# Patient Record
Sex: Male | Born: 1940 | Race: White | Hispanic: No | Marital: Married | Smoking: Never smoker
Health system: Southern US, Community
[De-identification: ages and names within clinical notes are randomized; demographics above are authoritative.]

## PROBLEM LIST (undated history)

## (undated) DIAGNOSIS — E039 Hypothyroidism, unspecified: Secondary | ICD-10-CM

## (undated) DIAGNOSIS — R0602 Shortness of breath: Secondary | ICD-10-CM

## (undated) DIAGNOSIS — I10 Essential (primary) hypertension: Secondary | ICD-10-CM

## (undated) DIAGNOSIS — G459 Transient cerebral ischemic attack, unspecified: Secondary | ICD-10-CM

## (undated) DIAGNOSIS — J449 Chronic obstructive pulmonary disease, unspecified: Secondary | ICD-10-CM

## (undated) DIAGNOSIS — E119 Type 2 diabetes mellitus without complications: Secondary | ICD-10-CM

## (undated) HISTORY — PX: OTHER SURGICAL HISTORY: SHX169

---

## 1999-12-09 ENCOUNTER — Encounter: Payer: Self-pay | Admitting: Emergency Medicine

## 1999-12-09 ENCOUNTER — Encounter (INDEPENDENT_AMBULATORY_CARE_PROVIDER_SITE_OTHER): Payer: Self-pay | Admitting: Specialist

## 1999-12-09 ENCOUNTER — Inpatient Hospital Stay (HOSPITAL_COMMUNITY): Admission: EM | Admit: 1999-12-09 | Discharge: 1999-12-12 | Payer: Self-pay | Admitting: Emergency Medicine

## 1999-12-11 ENCOUNTER — Encounter: Payer: Self-pay | Admitting: Cardiology

## 2000-02-21 ENCOUNTER — Ambulatory Visit (HOSPITAL_COMMUNITY): Admission: RE | Admit: 2000-02-21 | Discharge: 2000-02-21 | Payer: Self-pay | Admitting: Endocrinology

## 2002-10-17 ENCOUNTER — Ambulatory Visit (HOSPITAL_COMMUNITY): Admission: RE | Admit: 2002-10-17 | Discharge: 2002-10-17 | Payer: Self-pay | Admitting: *Deleted

## 2002-10-17 ENCOUNTER — Encounter (INDEPENDENT_AMBULATORY_CARE_PROVIDER_SITE_OTHER): Payer: Self-pay | Admitting: Specialist

## 2003-01-21 ENCOUNTER — Encounter: Admission: RE | Admit: 2003-01-21 | Discharge: 2003-01-21 | Payer: Self-pay | Admitting: Endocrinology

## 2003-01-21 ENCOUNTER — Encounter: Payer: Self-pay | Admitting: Endocrinology

## 2003-06-05 ENCOUNTER — Inpatient Hospital Stay (HOSPITAL_COMMUNITY): Admission: RE | Admit: 2003-06-05 | Discharge: 2003-06-06 | Payer: Self-pay | Admitting: Neurosurgery

## 2005-09-11 ENCOUNTER — Encounter (INDEPENDENT_AMBULATORY_CARE_PROVIDER_SITE_OTHER): Payer: Self-pay | Admitting: *Deleted

## 2005-09-11 ENCOUNTER — Ambulatory Visit (HOSPITAL_COMMUNITY): Admission: RE | Admit: 2005-09-11 | Discharge: 2005-09-13 | Payer: Self-pay | Admitting: Urology

## 2005-10-15 ENCOUNTER — Observation Stay (HOSPITAL_COMMUNITY): Admission: EM | Admit: 2005-10-15 | Discharge: 2005-10-16 | Payer: Self-pay | Admitting: Emergency Medicine

## 2005-12-26 ENCOUNTER — Encounter: Admission: RE | Admit: 2005-12-26 | Discharge: 2005-12-26 | Payer: Self-pay | Admitting: Endocrinology

## 2006-01-26 ENCOUNTER — Ambulatory Visit (HOSPITAL_BASED_OUTPATIENT_CLINIC_OR_DEPARTMENT_OTHER): Admission: RE | Admit: 2006-01-26 | Discharge: 2006-01-26 | Payer: Self-pay | Admitting: Urology

## 2006-01-26 ENCOUNTER — Encounter (INDEPENDENT_AMBULATORY_CARE_PROVIDER_SITE_OTHER): Payer: Self-pay | Admitting: Specialist

## 2007-11-04 ENCOUNTER — Ambulatory Visit (HOSPITAL_COMMUNITY): Admission: RE | Admit: 2007-11-04 | Discharge: 2007-11-04 | Payer: Self-pay | Admitting: *Deleted

## 2007-11-04 ENCOUNTER — Encounter (INDEPENDENT_AMBULATORY_CARE_PROVIDER_SITE_OTHER): Payer: Self-pay | Admitting: *Deleted

## 2009-09-03 ENCOUNTER — Encounter (INDEPENDENT_AMBULATORY_CARE_PROVIDER_SITE_OTHER): Payer: Self-pay | Admitting: Internal Medicine

## 2009-09-03 ENCOUNTER — Ambulatory Visit: Payer: Self-pay | Admitting: Internal Medicine

## 2009-09-03 ENCOUNTER — Inpatient Hospital Stay (HOSPITAL_COMMUNITY): Admission: EM | Admit: 2009-09-03 | Discharge: 2009-09-03 | Payer: Self-pay | Admitting: Emergency Medicine

## 2009-09-04 ENCOUNTER — Telehealth (INDEPENDENT_AMBULATORY_CARE_PROVIDER_SITE_OTHER): Payer: Self-pay | Admitting: *Deleted

## 2009-09-05 ENCOUNTER — Ambulatory Visit: Payer: Self-pay

## 2009-09-05 ENCOUNTER — Ambulatory Visit: Payer: Self-pay | Admitting: Cardiovascular Disease

## 2009-09-05 ENCOUNTER — Encounter (HOSPITAL_COMMUNITY): Admission: RE | Admit: 2009-09-05 | Discharge: 2009-11-06 | Payer: Self-pay | Admitting: Internal Medicine

## 2009-09-06 DIAGNOSIS — I10 Essential (primary) hypertension: Secondary | ICD-10-CM | POA: Insufficient documentation

## 2009-09-06 DIAGNOSIS — C679 Malignant neoplasm of bladder, unspecified: Secondary | ICD-10-CM | POA: Insufficient documentation

## 2009-09-06 DIAGNOSIS — E119 Type 2 diabetes mellitus without complications: Secondary | ICD-10-CM

## 2009-09-06 DIAGNOSIS — E785 Hyperlipidemia, unspecified: Secondary | ICD-10-CM

## 2009-09-07 ENCOUNTER — Ambulatory Visit: Payer: Self-pay | Admitting: Pulmonary Disease

## 2009-09-07 DIAGNOSIS — R0989 Other specified symptoms and signs involving the circulatory and respiratory systems: Secondary | ICD-10-CM

## 2009-09-07 DIAGNOSIS — R0609 Other forms of dyspnea: Secondary | ICD-10-CM

## 2009-10-01 ENCOUNTER — Ambulatory Visit: Payer: Self-pay | Admitting: Pulmonary Disease

## 2009-10-01 DIAGNOSIS — J438 Other emphysema: Secondary | ICD-10-CM

## 2010-06-16 ENCOUNTER — Emergency Department (HOSPITAL_COMMUNITY)
Admission: EM | Admit: 2010-06-16 | Discharge: 2010-06-16 | Payer: Self-pay | Source: Home / Self Care | Admitting: Emergency Medicine

## 2010-08-08 NOTE — Miscellaneous (Signed)
Summary: Orders Update pft charges  Clinical Lists Changes  Orders: Added new Service order of Carbon Monoxide diffusing w/capacity (94720) - Signed Added new Service order of Lung Volumes (94240) - Signed Added new Service order of Spirometry (Pre & Post) (94060) - Signed 

## 2010-08-08 NOTE — Assessment & Plan Note (Signed)
Summary: Cardiology Nuclear Study  Nuclear Med Background Indications for Stress Test: Evaluation for Ischemia  Indications Comments: 09/02/09 CP (-) enzymes  History: Echo, Heart Catheterization   Symptoms: Chest Pain, Chest Pain with Exertion, SOB    Nuclear Pre-Procedure Cardiac Risk Factors: Family History - CAD, History of Smoking, Lipids, NIDDM, Smoker Caffeine/Decaff Intake: None NPO After: 7:00 PM Lungs: diminished but without wheezing IV 0.9% NS with Angio Cath: 22g     IV Site: (L) AC IV Started by: Irven Baltimore RN Chest Size (in) 46     Height (in): 71 Weight (lb): 244 BMI: 34.15  Nuclear Med Study 1 or 2 day study:  1 day     Stress Test Type:  Carlton Adam Reading MD:  Jenkins Rouge, MD     Referring MD:  D.Bensimhon Resting Radionuclide:  Technetium 69m Tetrofosmin     Resting Radionuclide Dose:  11.0 mCi  Stress Radionuclide:  Technetium 13m Tetrofosmin     Stress Radionuclide Dose:  32.0 mCi   Stress Protocol   Lexiscan: 0.4 mg   Stress Test Technologist:  Perrin Maltese EMT-P     Nuclear Technologist:  Mariann Laster Deal RT-N  Rest Procedure  Myocardial perfusion imaging was performed at rest 45 minutes following the intravenous administration of Myoview Technetium 18m Tetrofosmin.  Stress Procedure  The patient received IV Lexiscan 0.4 mg over 15-seconds.  Myoview injected at 30-seconds.  There were no significant changes and a rare pvc with infusion.  Quantitative spect images were obtained after a 45 minute delay.  QPS Raw Data Images:  Normal; no motion artifact; normal heart/lung ratio. Stress Images:  NI: Uniform and normal uptake of tracer in all myocardial segments. Rest Images:  Normal homogeneous uptake in all areas of the myocardium. Subtraction (SDS):  Normal Transient Ischemic Dilatation:  no  (Normal <1.22)  Lung/Heart Ratio:  .38  (Normal <0.45)  Quantitative Gated Spect Images QGS EDV:  96 ml QGS ESV:  40 ml QGS EF:  59 % QGS cine images:   normal  Findings Normal nuclear study      Overall Impression  Exercise Capacity: Lexiscan BP Response: Normal blood pressure response. Clinical Symptoms: Dyspnea ECG Impression: No significant ST segment change suggestive of ischemia. Overall Impression: Normal stress nuclear study. Overall Impression Comments: Normal  Appended Document: Cardiology Nuclear Study ok  Appended Document: Cardiology Nuclear Study Left message to call back   Appended Document: Cardiology Nuclear Study pt aware

## 2010-08-08 NOTE — Assessment & Plan Note (Signed)
Summary: consult for dyspnea   Copy to:  Glori Bickers Primary Provider/Referring Provider:  Wilson Singer  CC:  Pulmonary Consult.  History of Present Illness: The pt is a 70y/o male who I have been asked to see for dyspnea.  He is unable to pinpoint when his sob started, but thinks it has been less than 5 years.  He describes doe at less than one block at a moderate pace on flat ground, but will not get winded bringing groceries in from the car.  He will get sob walking one flight of stairs.  He has a mild cough that is intermittantly productive of white mucus.  He has a long h/o tobacco abuse, but has never had pfts.  A recent cxr is clear.  He has had a recent echo that was normal.  His believes that his weight is neutral over the last 2 years.  Current Medications (verified): 1)  Glipizide Xl 10 Mg Xr24h-Tab (Glipizide) .... Take 1 Tablet By Mouth Once A Day 2)  Lisinopril 5 Mg Tabs (Lisinopril) .... Take 1 Tablet By Mouth Once A Day 3)  Maxzide-25 37.5-25 Mg Tabs (Triamterene-Hctz) .... Take 1 Tablet By Mouth Once A Day 4)  Metformin Hcl 500 Mg Tabs (Metformin Hcl) .... Take 1 Tablet By Mouth Two Times A Day 5)  Actos 30 Mg Tabs (Pioglitazone Hcl) .... Take 1 Tablet By Mouth Once A Day 6)  Zocor 20 Mg Tabs (Simvastatin) .... Take 1 Tablet By Mouth Once A Day 7)  Omeprazole 20 Mg Cpdr (Omeprazole) .... Take 1 Tablet By Mouth Once A Day 8)  Synthroid 150 Mcg Tabs (Levothyroxine Sodium) .... Take 1 Tablet By Mouth Once A Day  Allergies (verified): 1)  ! Prednisone  Past History:  Past Medical History:  CARCINOMA, BLADDER (ICD-188.9) HYPERTENSION (ICD-401.9) HYPERLIPIDEMIA (ICD-272.4) DIABETES, TYPE 2 (ICD-250.00)    Past Surgical History: cholecystectomy neck surgery back surgery tonsillectomy  Family History: Reviewed history and no changes required. cancer: father (prostate), mother (colon), sister (breast)   Social History: Reviewed history from 09/06/2009 and no changes  required. Patient states former smoker. started at age 77.  quit 2006. 2 to 2 1/2 ppd. pt is married and lives with wife. pt is a retired Administrator.   Review of Systems       The patient complains of shortness of breath with activity, non-productive cough, and chest pain.  The patient denies shortness of breath at rest, productive cough, coughing up blood, irregular heartbeats, acid heartburn, indigestion, loss of appetite, weight change, abdominal pain, difficulty swallowing, sore throat, tooth/dental problems, headaches, nasal congestion/difficulty breathing through nose, sneezing, itching, ear ache, anxiety, depression, hand/feet swelling, joint stiffness or pain, rash, change in color of mucus, and fever.    Vital Signs:  Patient profile:   70 year old male Height:      70 inches Weight:      249 pounds BMI:     35.86 O2 Sat:      96 % on Room air Temp:     97.6 degrees F oral Pulse rate:   102 / minute BP sitting:   116 / 64  (left arm) Cuff size:   large  Vitals Entered By: Matthew Folks LPN (March  4, 624THL 579FGE PM)  O2 Flow:  Room air CC: Pulmonary Consult Comments Medications reviewed with patient Matthew Folks LPN  March  4, 624THL 579FGE PM    Physical Exam  General:  ow male in nad Eyes:  PERRLA  and EOMI.   Nose:  patent without discharge Mouth:  mild elonagation of soft palate and uvula Neck:  no jvd, tmg, LN Lungs:  decreased bs throughout, no wheezing or rhonchi  Heart:  rrr, no mrg Abdomen:  soft and nontender, bs+ Extremities:  no edema noted, pulses intact distally no cyanosis Neurologic:  alert and oriented, moves all 4.   Impression & Recommendations:  Problem # 1:  DYSPNEA ON EXERTION (ICD-786.09) The pt has doe with primarily moderate exertional levels, but is interfering with his QOL.  He is overweight and deconditioned, but he may have underlying obstructive lung disease given his extensive tobacco abuse history.  I would like to schedule for  full pfts, and have him f/u with me thereafter.  Medications Added to Medication List This Visit: 1)  Actos 30 Mg Tabs (Pioglitazone hcl) .... Take 1 tablet by mouth once a day 2)  Omeprazole 20 Mg Cpdr (Omeprazole) .... Take 1 tablet by mouth once a day 3)  Synthroid 150 Mcg Tabs (Levothyroxine sodium) .... Take 1 tablet by mouth once a day  Other Orders: Consultation Level IV [id number]) Pulmonary Referral (Pulmonary)  Patient Instructions: 1)  will set up for breathing studies, and see you back the same day. 2)  work on weight loss and conditiioning.

## 2010-08-08 NOTE — Assessment & Plan Note (Signed)
Summary: rov for emphysema   Visit Type:  Follow-up Copy to:  Glori Bickers Primary Provider/Referring Provider:  Wilson Singer  CC:  Pt here for follow up with PFT.  History of Present Illness: the pt comes in today for f/u of his pfts, as part of a w/u for dyspnea.  He was found to have severe airflow obstruction, but had a 38% and 49% improvement in FEV1 and FVC respectively.  He had no restriction, and had significant airtrapping with increased RV.  Dlco was mildly reduced.  I have reviewed the study with him in detail, and answered all of his questions.  Current Medications (verified): 1)  Glipizide Xl 10 Mg Xr24h-Tab (Glipizide) .... Take 1 Tablet By Mouth Once A Day 2)  Lisinopril 5 Mg Tabs (Lisinopril) .... Take 1 Tablet By Mouth Once A Day 3)  Maxzide-25 37.5-25 Mg Tabs (Triamterene-Hctz) .... Take 1 Tablet By Mouth Once A Day 4)  Metformin Hcl 500 Mg Tabs (Metformin Hcl) .... Take 1 Tablet By Mouth Two Times A Day 5)  Actos 30 Mg Tabs (Pioglitazone Hcl) .... Take 1 Tablet By Mouth Once A Day 6)  Zocor 20 Mg Tabs (Simvastatin) .... Take 1 Tablet By Mouth Once A Day 7)  Omeprazole 20 Mg Cpdr (Omeprazole) .... Take 1 Tablet By Mouth Once A Day 8)  Synthroid 150 Mcg Tabs (Levothyroxine Sodium) .... Take 1 Tablet By Mouth Once A Day  Allergies (verified): 1)  ! Prednisone  Vital Signs:  Patient profile:   70 year old male Height:      70 inches Weight:      244 pounds O2 Sat:      91 % on Room air Temp:     98.0 degrees F oral Pulse rate:   101 / minute BP sitting:   110 / 64  (left arm) Cuff size:   large  Vitals Entered By: Iran Planas CMA (October 01, 2009 2:19 PM)  O2 Flow:  Room air CC: Pt here for follow up with PFT Comments Medications reviewed with patient Verified contact number and pharmacy with patient Iran Planas CMA  October 01, 2009 2:22 PM    Physical Exam  General:  ow male in nad   Impression & Recommendations:  Problem # 1:  EMPHYSEMA  (ICD-492.8)  the pt has severe airflow obstruction by his pfts, but has a very significant response to bronchodilators.  HIs history and findings are most c/w at lease moderate emphysema with an asthmatic component.  I have had a long discussion with him about his diagnosis, and have recommended an aggressive bronchodilator regimen for 6-8 weeks to see how he responds.  Time spent with pt today reviewing the above was 78min.  Medications Added to Medication List This Visit: 1)  Spiriva Handihaler 18 Mcg Caps (Tiotropium bromide monohydrate) .... One puff in handihaler daily 2)  Symbicort 160-4.5 Mcg/act Aero (Budesonide-formoterol fumarate) .... Two puffs twice daily 3)  Proair Hfa 108 (90 Base) Mcg/act Aers (Albuterol sulfate) .... 2 puffs every 4-6 hours as needed  Other Orders: Est. Patient Level III [id number])  Patient Instructions: 1)  start on spiriva one inhalation each am 2)  start symbicort 160/4.5  2 inhalations am and pm....rinse mouth, gargle, and spit after each use. 3)  carry albuterol inhaler with you in your pocket to use for emergencies...2 inhalations at a time, no more than 4 times a day. 4)  work on weight loss and some type of exercise program 5)  followup  with me in 8 weeks.  Prescriptions: PROAIR HFA 108 (90 BASE) MCG/ACT  AERS (ALBUTEROL SULFATE) 2 puffs every 4-6 hours as needed  #1 x 6   Entered and Authorized by:   Kathee Delton MD   Signed by:   Kathee Delton MD on 10/01/2009   Method used:   Print then Give to Patient   RxID:   [id number] SYMBICORT 160-4.5 MCG/ACT  AERO (BUDESONIDE-FORMOTEROL FUMARATE) Two puffs twice daily  #1 x 6   Entered and Authorized by:   Kathee Delton MD   Signed by:   Kathee Delton MD on 10/01/2009   Method used:   Print then Give to Patient   RxID:   [id number] SPIRIVA HANDIHALER 18 MCG  CAPS (TIOTROPIUM BROMIDE MONOHYDRATE) one puff in handihaler daily  #30 x 6   Entered and Authorized by:   Kathee Delton MD    Signed by:   Kathee Delton MD on 10/01/2009   Method used:   Print then Give to Patient   RxID:   [id number]    Immunization History:  Influenza Immunization History:    Influenza:  historical (09/17/2009)  Pneumovax Immunization History:    Pneumovax:  historical (07/09/2009)

## 2010-08-08 NOTE — Progress Notes (Signed)
Summary: Nuclear Pre-Procedure  Phone Note Outgoing Call   Call placed by: Perrin Maltese, EMT-P,  September 04, 2009 3:20 PM Summary of Call: Left message with information on Myoview Information Sheet (see scanned document for details).      Nuclear Med Background Indications for Stress Test: Evaluation for Ischemia  Indications Comments: 09/02/09 CP (-) enzymes  History: Echo, Heart Catheterization   Symptoms: Chest Pain, Chest Pain with Exertion, SOB    Nuclear Pre-Procedure Cardiac Risk Factors: Family History - CAD, History of Smoking, Lipids, NIDDM, Smoker  Nuclear Med Study Referring MD:  D.Bensimhon

## 2010-09-16 LAB — BASIC METABOLIC PANEL
BUN: 10 mg/dL (ref 6–23)
Chloride: 104 mEq/L (ref 96–112)
Creatinine, Ser: 1.16 mg/dL (ref 0.4–1.5)
GFR calc Af Amer: >60 mL/min (ref >60)
Glucose, Bld: 66 mg/dL — ABNORMAL LOW (ref 70–99)
Potassium: 3.6 mEq/L (ref 3.5–5.1)

## 2010-09-16 LAB — CBC
HCT: 31.4 % — ABNORMAL LOW (ref 39.0–52.0)
MCHC: 31.2 g/dL (ref 30.0–36.0)
MCV: 86 fL (ref 78.0–100.0)
RDW: 12.9 % (ref 11.5–15.5)

## 2010-09-16 LAB — POCT I-STAT, CHEM 8
BUN: 11 mg/dL (ref 6–23)
Calcium, Ion: 1.04 mmol/L — ABNORMAL LOW (ref 1.12–1.32)
Glucose, Bld: 66 mg/dL — ABNORMAL LOW (ref 70–99)
HCT: 32 % — ABNORMAL LOW (ref 39.0–52.0)
TCO2: 27 mmol/L (ref 0–100)

## 2010-09-16 LAB — DIFFERENTIAL
Basophils Absolute: 0 10*3/uL (ref 0.0–0.1)
Basophils Relative: 0 % (ref 0–1)
Eosinophils Relative: 1 % (ref 0–5)
Monocytes Absolute: 0.7 10*3/uL (ref 0.1–1.0)

## 2010-09-25 LAB — DIFFERENTIAL
Lymphocytes Relative: 14 % (ref 12–46)
Lymphs Abs: 0.9 10*3/uL (ref 0.7–4.0)
Monocytes Relative: 8 % (ref 3–12)
Neutro Abs: 4.9 10*3/uL (ref 1.7–7.7)
Neutrophils Relative %: 74 % (ref 43–77)

## 2010-09-25 LAB — URINALYSIS, ROUTINE W REFLEX MICROSCOPIC
Glucose, UA: NEGATIVE mg/dL
Ketones, ur: NEGATIVE mg/dL
Protein, ur: NEGATIVE mg/dL

## 2010-09-25 LAB — IRON AND TIBC
Iron: 41 ug/dL — ABNORMAL LOW (ref 42–135)
Saturation Ratios: 12 % — ABNORMAL LOW (ref 20–55)
UIBC: 291 ug/dL

## 2010-09-25 LAB — CROSSMATCH
ABO/RH(D): O POS
Antibody Screen: NEGATIVE

## 2010-09-25 LAB — COMPREHENSIVE METABOLIC PANEL
ALT: 15 U/L (ref 0–53)
AST: 18 U/L (ref 0–37)
Albumin: 3.6 g/dL (ref 3.5–5.2)
BUN: 19 mg/dL (ref 6–23)
CO2: 25 mEq/L (ref 19–32)
Calcium: 8.9 mg/dL (ref 8.4–10.5)
Calcium: 9.1 mg/dL (ref 8.4–10.5)
Chloride: 104 mEq/L (ref 96–112)
Creatinine, Ser: 1.41 mg/dL (ref 0.4–1.5)
Creatinine, Ser: 1.69 mg/dL — ABNORMAL HIGH (ref 0.4–1.5)
GFR calc Af Amer: >60 mL/min (ref >60)
GFR calc non Af Amer: 41 mL/min — ABNORMAL LOW (ref >60)
Glucose, Bld: 102 mg/dL — ABNORMAL HIGH (ref 70–99)
Sodium: 136 mEq/L (ref 135–145)
Total Protein: 6.9 g/dL (ref 6.0–8.3)

## 2010-09-25 LAB — CARDIAC PANEL(CRET KIN+CKTOT+MB+TROPI)
CK, MB: 2.2 ng/mL (ref 0.3–4.0)
CK, MB: 2.3 ng/mL (ref 0.3–4.0)
Relative Index: 1.9 (ref 0.0–2.5)
Relative Index: 2.1 (ref 0.0–2.5)
Total CK: 107 U/L (ref 7–232)
Total CK: 118 U/L (ref 7–232)
Troponin I: 0.01 ng/mL (ref 0.00–0.06)

## 2010-09-25 LAB — LIPID PANEL: VLDL: 22 mg/dL (ref 0–40)

## 2010-09-25 LAB — CBC
Hemoglobin: 10.3 g/dL — ABNORMAL LOW (ref 13.0–17.0)
MCHC: 33.9 g/dL (ref 30.0–36.0)
MCHC: 34.1 g/dL (ref 30.0–36.0)
MCV: 92.3 fL (ref 78.0–100.0)
MCV: 92.5 fL (ref 78.0–100.0)
Platelets: 157 10*3/uL (ref 150–400)
RBC: 3.02 MIL/uL — ABNORMAL LOW (ref 4.22–5.81)
RDW: 13.6 % (ref 11.5–15.5)
WBC: 7 10*3/uL (ref 4.0–10.5)

## 2010-09-25 LAB — POCT CARDIAC MARKERS
CKMB, poc: 1.5 ng/mL (ref 1.0–8.0)
Myoglobin, poc: 123 ng/mL (ref 12–200)
Myoglobin, poc: 93.2 ng/mL (ref 12–200)
Troponin i, poc: <0.05 ng/mL (ref 0.00–0.09)

## 2010-09-25 LAB — TROPONIN I: Troponin I: 0.02 ng/mL (ref 0.00–0.06)

## 2010-09-25 LAB — RETICULOCYTES
RBC.: 3.1 MIL/uL — ABNORMAL LOW (ref 4.22–5.81)
Retic Count, Absolute: 34.1 10*3/uL (ref 19.0–186.0)

## 2010-09-25 LAB — VITAMIN B12: Vitamin B-12: 213 pg/mL (ref 211–911)

## 2010-09-25 LAB — ABO/RH: ABO/RH(D): O POS

## 2010-09-25 LAB — HEMOGLOBIN A1C
Hgb A1c MFr Bld: 7.2 % — ABNORMAL HIGH (ref 4.6–6.1)
Mean Plasma Glucose: 160 mg/dL

## 2010-09-25 LAB — CK TOTAL AND CKMB (NOT AT ARMC): CK, MB: 2.1 ng/mL (ref 0.3–4.0)

## 2010-09-25 LAB — GLUCOSE, CAPILLARY: Glucose-Capillary: 104 mg/dL — ABNORMAL HIGH (ref 70–99)

## 2010-09-25 LAB — D-DIMER, QUANTITATIVE: D-Dimer, Quant: 0.29 ug/mL-FEU (ref 0.00–0.48)

## 2010-11-19 NOTE — Op Note (Signed)
NAME:  Harold Martin, Harold Martin NO.:  [id number]   MEDICAL RECORD NO.:  [id number]          PATIENT TYPE:  AMB   LOCATION:  ENDO                         FACILITY:  [facility]   PHYSICIAN:  Waverly Ferrari, M.D.    DATE OF BIRTH:  1940/10/01   DATE OF PROCEDURE:  11/04/2007  DATE OF DISCHARGE:                               OPERATIVE REPORT   PROCEDURE:  Colonoscopy.   INDICATIONS:  Colon polyps.   ANESTHESIA:  Fentanyl 50 mcg, Versed 2 mg.   DESCRIPTION OF PROCEDURE:  With the patient mildly sedated in the left  lateral decubitus position, a rectal exam was attempted.  Subsequently  the Pentax videoscopic colonoscope was inserted in the rectum and passed  under direct vision with pressure applied to reach the cecum identified  by the ileocecal valve and appendiceal orifice both of which were  photographed.  From this point the colonoscope was slowly withdrawn  taking circumferential views of colonic mucosa stopping to photograph  diverticula along the way in the sigmoid colon until we reached the  rectum which appeared normal on direct and showed hemorrhoids and a  polyp on retroflex view.  The polyp was removed using hot biopsy forceps  technique setting of 20/150 blended current.  The endoscope was  straightened and withdrawn.  The patient's vital signs and pulse  oximeter remained stable.  The patient tolerated the procedure well  without apparent complications.   FINDINGS:  1. Mild diverticulosis of the sigmoid colon.  2. Internal hemorrhoids.  3. Polyp of rectum.   Await biopsy report.  The patient will call me for results and follow-up  with me as an outpatient.           ______________________________  Waverly Ferrari, M.D.     GMO/MEDQ  D:  11/04/2007  T:  11/04/2007  Job:  [id number]

## 2010-11-19 NOTE — Op Note (Signed)
NAME:  Harold Martin, Harold Martin NO.:  [id number]   MEDICAL RECORD NO.:  [id number]          PATIENT TYPE:  AMB   LOCATION:  ENDO                         FACILITY:  [facility]   PHYSICIAN:  Waverly Ferrari, M.D.    DATE OF BIRTH:  02/11/1941   DATE OF PROCEDURE:  11/04/2007  DATE OF DISCHARGE:                               OPERATIVE REPORT   PROCEDURE:  Upper endoscopy.   INDICATIONS:  Gastroesophageal reflux disease.   ANESTHESIA:  Fentanyl 50 mcg, Versed 8 mg.   PROCEDURE:  With the patient mildly sedated in the left lateral  decubitus position, the Pentax videoscopic endoscope was inserted in the  mouth and passed under direct vision through the esophagus which  appeared normal, until we reached distal esophagus and there appeared to  be a possibly early stricture with possible Barrett's seen as well.  This was photographed and biopsied.  We entered into the stomach.  Fundus, body, antrum, duodenal bulb, second portion of duodenum were  visualized.  From this point the endoscope was slowly withdrawn taking  circumferential views of the duodenal mucosa until the endoscope had  been pulled back in the stomach, placed in retroflexion to view the  stomach from below.  The endoscope was then straightened and withdrawn  taking circumferential views of the remaining gastric and esophageal  mucosa.  The patient's vital signs and pulse oximeter remained stable.  The patient tolerated the procedure well without apparent complications.   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   Await biopsy report.  The patient will call me for results and follow up  with me as an outpatient.  Proceed to colonoscopy           ______________________________  Waverly Ferrari, M.D.     GMO/MEDQ  D:  11/04/2007  T:  11/04/2007  Job:  [id number]

## 2010-11-22 NOTE — Procedures (Signed)
[place]. [facility]  Patient:    Harold Martin, Harold Martin                     MRN: [id number] Adm. Date:  [id number] Disc. Date: [id number] Attending:  Lawana Pai CC:         Jeanella Craze. Little, M.D.                           Procedure Report  PROCEDURE:  Upper endoscopy with biopsy.  INDICATIONS:  Chest and abdominal pain, see my consult note and hospitalization records for further details.  The patient was admitted to rule out coronary disease and pulmonary disease.  That done, history certainly was compatible with reflux and esophageal spasm.  Endoscopy done to assess a baseline objectively.  ANESTHESIA:  Demerol 70 mg, Versed 5 mg were given intravenously in divided dose.  DESCRIPTION OF PROCEDURE:  With the patient mildly sedated in the left lateral decubitus position, the Olympus videoscopic endoscope was inserted in the mouth and passed under direct vision through the esophagus.  Distal esophagus was approached and there appeared to be an area of Barretts esophagus with a tongue of erythematous tissue extending cephalad into the esophagus.  We were able to photograph this and subsequently biopsy this area along with some other areas of irregularity of the Z-line separating the esophagus and stomach that were consistent with esophagitis.  Once accomplished, the endoscope was advanced in the stomach, fundus, body, antrum all approached and photographs were taken and appeared normal. The endoscope was then advanced into the duodenal bulb and there was mild erythema consistent with mild duodenitis photographed only.  The second portion of the duodenum was seen and it appeared normal, it was photographed.  From this point, the endoscope was slowly withdrawn taking circumferential views and the entire duodenum and mucosa visualized until the endoscope had been pulled back into the stomach. It was placed in retroflexion and view of the stomach below and a  hiatal hernia was seen as evidence by incomplete wrap of the JE junction around the endoscope, this too was photographed.  The endoscope was then straightened and pulled back from distal to proximal stomach taking circumferential views of the entire gastric and subsequently esophageal mucosa which otherwise appeared normal.  The patients vital signs and pulse oximeter remained stable.  The patient tolerated the procedure well without apparent complications.  FINDINGS:  Mild changes of reflux with possible Barretts esophagus seen and photographed.  Await biopsy report.  The patient will call me for results and will follow up with me as an outpatient.  Will continue Prevacid b.i.d. as prescribed until I see him as an outpatient and will assess further. DD:  12/12/99 TD:  12/16/99 Job: [id number]

## 2010-11-22 NOTE — Cardiovascular Report (Signed)
[place]. [facility]  Patient:    Harold Martin, Harold Martin                     MRN: [id number] Proc. Date: 12/10/99 Adm. Date:  [id number] Disc. Date: [id number] Attending:  Lawana Pai CC:         Arleta Creek, M.D.             Jeanella Craze. Little, M.D.             [facility]                        Cardiac Catheterization  PROCEDURES: 1. Left heart catheterization. 2. Selective right and left coronary arteriography. 3. Ventriculography.  COMPLICATIONS:  None.  INDICATION:  [name] is a 70 year old male who has had chest pain intermittently, usually occurring at rest.  A nuclear study was suspicious for inferior ischemia.  He presented to the emergency room with worsening chest pain and has ruled out for myocardial infarction and is now brought to the cath lab to resolve the issue of CAD.  DESCRIPTION OF PROCEDURE:  Patient was prepped and draped in the usual sterile fashion, exposing the right groin.  Following local anesthetic with 1% Xylocaine, the Seldinger technique was employed and a 6-French introducer sheath was placed into the right femoral artery.  Selective right and left coronary arteriography and ventriculography in the RAO projection were performed; 6-French Judkins configuration catheters were used.  RESULTS: 1. Hemodynamic monitoring:  Central aortic pressure 142/84.  Left ventricular    pressure 141/18.  There was no aortic valve gradient noted at the time of    pullback. 2. Ventriculography:  Ventriculography in the RAO projection revealed normal    left ventricular systolic function, ejection fraction greater than 60% and    the end-diastolic pressure was 18. 3. Coronary arteriography:    a. Left main normal.    b. LAD:  The LAD had a mid-area of eccentric narrowing of about 50%, just       distal to the first diagonal.  The distal LAD and the diagonal were free       of disease.    c. Circumflex:  The circumflex  gave rise to two large OM vessels.  OM #1       had a proximal 40-50% area of narrowing; the distal vessel was free of       disease.  OM #2 was free of disease.    d. Right coronary artery:  The right coronary had proximal 40% narrowing,       with the remainder of vessel being free of significant disease.  CONCLUSION: 1. 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 2. Normal left ventricular systolic function.  At this point, there is no evidence of any severe obstruction to explain his rest pain.  Will check a spiral CT to rule out a pulmonary embolus or aortic dissection.  Of note, an aortic root injection showed no evidence of dilatation of the aortic arch. DD:  12/16/99 TD:  12/18/99 Job: [id number]

## 2010-11-22 NOTE — Op Note (Signed)
NAME:  Harold Martin, Harold Martin              ACCOUNT NO.:  [id number]   MEDICAL RECORD NO.:  [id number]          PATIENT TYPE:  AMB   LOCATION:  DAY                          FACILITY:  [facility]   PHYSICIAN:  Lillette Boxer. Dahlstedt, M.D.DATE OF BIRTH:  1940-07-11   DATE OF PROCEDURE:  09/11/2005  DATE OF DISCHARGE:                                 OPERATIVE REPORT   PREOPERATIVE DIAGNOSIS:  Hematuria with bladder tumor detected in office  cystoscopy.   POSTOPERATIVE DIAGNOSIS:  Hematuria with bladder tumor detected in office  cystoscopy.   PROCEDURE:  1.  Cystoscopy.  2.  TURBT. (Median 3 cm tumor).   ANESTHESIA:  General.   INDICATIONS:  This is a 70 year old gentleman who presented to Dr.  Alan Ripper clinic with hematuria, clots.  Office cystoscopy at the time  revealed 3 cm left wall bladder tumor.  The patient, at this point, warrants  formal cystoscopy in the operating room with TURBT.   DESCRIPTION OF PROCEDURE:  The patient was brought to the operating room  after preoperative antibiotics were given.  Time-out was taken to note  proper diagnosis, patient, and procedure.  The patient was placed in  dorsolithotomy position.  He was prepped and draped in normal sterile  fashion.  The resectoscope sheath was then slowly introduced, and the  bladder was then irrigated.  Multiple clots were noted, were irrigated out.  The resectoscope was then inserted, and the entire bladder was  pancystoscoped.  There was a 3 cm left lateral wall tumor that was just  lateral to the left ureteral orifice.  Both ureteral orifices were  identified and were effluxing urine normally.  The entire tumor was  resected.  Care was taken not to perforate the bladder.  The left ureteral  orifice was visualized frequently during the resection and avoided.  The  tumor was taken out.  Hemostasis was achieved.  Further inspection revealed  left ureteral orifice did not reveal any obvious injuries.  The resectoscope  was  taken out, and the regular cystoscope was then inserted.  This was done  after the tumor chips were irrigated out.  Upon insertion of the cystoscope,  the left ureteral orifice was cannulated with open-ended ureteral catheter.  The left catheter was introduced without difficulty into the left ureteral  orifice and advanced without resistance.  No urine drips were noted to be  coming out of the end of the catheter, indicating no obvious injury to the  left ureter or ureteral orifice.  The ureteral catheter and the cystoscope  were taken out.  Care was taken to inspect the urethral mucosa which was  devoid of masses, lesions, or stones.  Foley catheter was then inserted, and  10 mL of water were directed through the balloon.  The urinary drainage was  clear.   DISPOSITION:  The patient was extubated in the operating room and taken in  stable condition to PACU care.  Please note that Dr. Diona Fanti was present  and participated in the entire procedure as he was the responsible surgeon.     ______________________________  S. Para March  Lillette Boxer. Dahlstedt, M.D.  Electronically Signed    SK/MEDQ  D:  09/11/2005  T:  09/12/2005  Job:  [id number]

## 2010-11-22 NOTE — Discharge Summary (Signed)
[place]. [facility]  Patient:    Harold Martin, Harold Martin                     MRN: [id number] Adm. Date:  [id number] Disc. Date: [id number] Attending:  Lawana Pai Dictator:   Baker Janus, P.A. CC:         Jim Desanctis, M.D.             Jeanella Craze. Little, M.D.             Arleta Creek, M.D.                           Discharge Summary  DATE OF BIRTH:   06-Nov-1940.  ADMITTING DIAGNOSES: 1. Chest pain. 2. Diabetes. 3. Hypothyroidism. 4. Obesity. 5. Prostatitis. 6. Status post cholecystectomy in 1968. 7. Reflux.  CONSULTS:  GI for noncardiac chest pain December 11, 1999.  PROCEDURES: 1. Left heart catheterization December 10, 1999. 2. Spiral CT December 11, 1999. 3. EGD, December 12, 1999.  COMPLICATIONS:  None.  DISCHARGE CONDITION:  Much improved.  No complaints of chest pain, shortness of breath, or arrhythmias. Ambulating the hallways without difficulty.  DISPOSITION:  Home.  DISCHARGE MEDICATIONS: 1. Synthroid 150 mcg 1 p.o. q. day. 2. Glucophage 500 mg 1 p.o. b.i.d. restart Saturday. 3. Glucotrol 10 mg 1 p.o. b.i.d. 4. Tylenol p.r.n. 5. Prevacid 30 mg 1 p.o. b.i.d. 6. Baycol 0.4 mg 1 p.o. q. day.  DISCHARGE INSTRUCTIONS:  No strenuous activity, sexual activity or heavy lifting greater than 10 pounds over the next 48 hours.  DIET:  Maintain low fat, low salt, low cholesterol diet.  Maintain diabetic diet.  WOUND CARE:  May shower today.  Observe right groin for severe bleeding, swelling or inflammation. Call with fever greater than 101.0.  No smoking!.  No drinking!.  FOLLOWUP: 1. See Dr. Rex Kras in 3-4 weeks.  Call office for appointment. 2. See Dr. Lajoyce Corners in 2-3 weeks.  Call on Monday for biopsy report. 3. See Dr. Wilson Singer to follow blood sugars.  HISTORY OF PRESENT ILLNESS:  The patient is a 70 year old white male with no known coronary artery disease with 3 months of chest pain in the left breast expressed as "tearing/sticking" with weakness,  diaphoresis, lightheadedness, and nausea.  Occasionally loses balance with walking during episode.  Each episode 10 minutes to an hour with tingling in the left hand and leg today. On average 2 to 3 times a day and 8 to 10 times a week with exertion or riding in truck.  He had a Persantine Cardiolite on Nov 22, 1999, read by Dr. Claiborne Billings, and indicated distal inferior and inferolateral ischemia with an ejection fraction of 52%.  He was instructed to set up an appointment with [facility] for evaluation but nurse did not return call for several days then told patient would need referral from primary care physician.  This a.m. wife noticed patient was flushed and pale as he was leaving to go to work. Wife called primary care and was instructed to go to the emergency room. Worse pain 10/10, currently 0/10 after given sublingual nitroglycerin.  The patient was admitted for anginal type symptoms and started on IV heparin, plavix and aspirin.  The patient was also stopped on his glucophage and currently pain free.  ALLERGIES:  No known drug allergies.  LABORATORY DATA:  Hematology on December 10, 1999, showed white blood count  6.6, hemoglobin 13.4, hematocrit 37.9, and platelets 172.  Coag studies on admission:  PT 13.2, INR 1, and PTT 76.  Routine chemistries on December 10, 1999, showed sodium 136, potassium 5.1, chloride 98, CO2 32, glucose 144, BUN 10, creatinine 1.0, calcium 9.2, total protein 6.4, albumin 3.5, AST 36, ALT 38, and ALP 77, total bilirubin 0.4, magnesium 1.7.  Cardiac markers ranged from total CK 194 to 124, CK-MB 1.6 to 2.5, relative index 1.8 to 1.1, and troponin I less 0.3.  Lipid profile done on December 10, 1999, showed total cholesterol 199, triglycerides 230, HDL 32, and LDL 121.  Urinalysis on December 10, 1999, was negative.  PA and lateral chest x-ray done on December 10, 1999, showed COPD/congestive heart failure, slight interval improvement.  Initial EKG on  admission normal sinus rhythm with incomplete right bundle branch block with no old tracing to compare. Subsequent EKG on the following day normal sinus rhythm with no significant changes since last tracing.  Left heart catheterization performed on December 10, 1999, showed the left main to be normal.  LAD mid eccentric 50% after first diagonal with distal LAD and diagonal okay.  Circumflex 2 large ______ with OM-1 proximal 40-50%.  Distal okay with OM-2 okay.  RCA with proximal 40%, with remainder okay.  LV normal systolic function with EF 60%.  No severe obstruction to explain his rest pain.  Will check spiral CT to rule out PE or aortic dissection.  Will discontinue heparin at this point.  Spiral CT negative for PE.  EGD on December 12, 1999, showed questionable short segment Barretts esophagitis, mild esophagitis, mild duodenitis.  Agree with b.i.d. prevacid with patient to follow up with biopsy report on Monday and followup in the office for an office visit in 2-4 weeks with Dr. Lajoyce Corners.  HOSPITAL COURSE:  The patient admitted on December 09, 1999, with anginal type symptoms and started on IV heparin, plavix and aspirin with glucophage stopped for possible intervention.  The patient was admitted to a telemetry bed with serial cardiac enzymes.  The patient had presented to the ER with several months of chest pain in his left breast associated with weakness, diaphoresis, lightheadedness, and nausea.  He had actually spoken with his primary care physician who had instructed him to come to the emergency room and had had a positive cardiolite done and read by Dr. Claiborne Billings at [facility] and had instructed him to schedule a followup appointment but was unable to do so.  The patient presented to [facility] ER and Dr. Aldona Bar was called in to evaluate.  The patient continued to do well overnight with no complaints of chest pain,  shortness of breath and arrhythmias but complained of  "swimmy headedness" believed to be due to nitroglycerin.  Blood pressure were stable and otherwise physical exam unremarkable.  The patient underwent cardiac catheterization without complications and was found to be noncardiac in origin.  GI evaluation to follow with EGD followup with some esophagitis found by Dr. Lajoyce Corners who would followup with patient as an outpatient.  A spiral CT was also performed to rule out pulmonary embolism.  This was also found to be negative.  The patient was discharged on December 12, 1999, with followup with Dr. Rex Kras in 2 weeks and followup with Dr. Lajoyce Corners in 3-4 weeks.  The patient was also instructed to call Dr. Shelly Flatten office on Monday for results of biopsy taken during EGD. DD:  01/07/00 TD:  01/07/00 Job: [id number]

## 2010-11-22 NOTE — Op Note (Signed)
   NAME:  Harold Martin, Harold Martin                        ACCOUNT NO.:  [id number]   MEDICAL RECORD NO.:  [id number]                   PATIENT TYPE:  AMB   LOCATION:  ENDO                                 FACILITY:  [facility]   PHYSICIAN:  Waverly Ferrari, M.D.                 DATE OF BIRTH:  [birth year]   DATE OF PROCEDURE:  10/17/2002  DATE OF DISCHARGE:                                 OPERATIVE REPORT   PROCEDURE PERFORMED:  Colonoscopy with polypectomy and biopsy.   ENDOSCOPIST:  Waverly Ferrari, M.D.   INDICATIONS FOR PROCEDURE:  Hemoccult positivity.   ANESTHESIA:  None further given.   DESCRIPTION OF PROCEDURE:  With the patient mildly sedated in the left  lateral decubitus position, the Olympus video colonoscope was inserted in  the rectum and passed under direct vision to the cecum, identified by the  ileocecal valve and appendiceal orifice, both of which were photographed.  From this point the colonoscope was slowly withdrawn taking circumferential  views of the entire colonic mucosa.  Of note, the prep was suboptimal and  there were areas of solid stool and areas of brownish thick liquid that  coated the colonic wall.  We washed and suctioned as best we could until we  reached approximately 20 cm from the anal verge at which point, polyp was  seen, photographed and removed using snare cautery technique setting of  20/20 blended current.  There was some residual tissue removed using hot  biopsy forceps technique, again a setting of 20/20 blended current.  There  was good hemostasis.  The endoscope was pulled back to the rectum which  appeared normal on direct and showed hemorrhoids in retroflex view.  The  endoscope was straightened and withdrawn.  The patient's vital signs and  pulse oximeter remained stable.  The patient tolerated the procedure well  without apparent complications.   FINDINGS:  Polyp at 20 cm.  Await biopsy report of this and the endoscopic  evaluation biopsies.   PLAN:  The patient will call me for results and follow up with me as an  outpatient.                                               Waverly Ferrari, M.D.    GMO/MEDQ  D:  10/17/2002  T:  10/17/2002  Job:  [id number]

## 2010-11-22 NOTE — Op Note (Signed)
   NAME:  Harold Martin, Harold Martin                        ACCOUNT NO.:  [id number]   MEDICAL RECORD NO.:  [id number]                   PATIENT TYPE:  AMB   LOCATION:  ENDO                                 FACILITY:  [facility]   PHYSICIAN:  Waverly Ferrari, M.D.                 DATE OF BIRTH:  1940/08/26   DATE OF PROCEDURE:  10/17/2002  DATE OF DISCHARGE:                                 OPERATIVE REPORT   PROCEDURE:  Upper endoscopy.   INDICATIONS:  Hemoccult positivity.   ANESTHESIA:  1. Demerol 80 mg.  2. Versed 8 mg.   DESCRIPTION OF PROCEDURE:  With patient mildly sedated in the left lateral  decubitus position, the Olympus videoscopic endoscope was inserted in the  mouth, passed under direct vision through the esophagus, which appeared  normal, until we reached the distal esophagus, and there was a question of  Barrett's seen, photographed, and biopsied.  We then entered into the  stomach.  Fundus, body, antrum, duodenal bulb, and second portion of  duodenum all appeared normal.  From this point, the endoscope was slowly  withdrawn, taking circumferential views of the duodenal mucosa until the  endoscope then pulled back into the stomach, placed in retroflexion to view  the stomach from below.  The endoscope was then straightened and withdrawn,  taking circumferential views of the remaining gastric and esophageal mucosa.  The patient's vital signs and pulse oximeter remained stable.  The patient  tolerated the procedure well without apparent complications.   FINDINGS:  1. Question of Barrett's esophagus, biopsied.  2. There was a small lipomatous area in the duodenal bulb.  3. Otherwise, unremarkable exam.   PLAN:  Proceed to colonoscopy.                                               Waverly Ferrari, M.D.    GMO/MEDQ  D:  10/17/2002  T:  10/17/2002  Job:  [id number]

## 2010-11-22 NOTE — Op Note (Signed)
NAME:  ZY, HEDGLIN                        ACCOUNT NO.:  [id number]   MEDICAL RECORD NO.:  [id number]                   PATIENT TYPE:  INP   LOCATION:  2895                                 FACILITY:  [facility]   PHYSICIAN:  Hosie Spangle, M.D.            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   PROCEDURE:  C3-4 anterior cervical diskectomy and arthrodesis with iliac  crest allograft and Tether cervical plating.   SURGEON:  Hosie Spangle, M.D.   ASSISTANT:  Marchia Meiers. Vertell Limber, M.D.   ANESTHESIA:  General endotracheal.   INDICATIONS:  The patient is a 70 year old man who presented with left-sided  neck pain extending down through the trapezius toward the left shoulder.  MRI and x-rays revealed left C3-4 facet arthropathy.  A decision was made to  proceed with a single-level anterior cervical diskectomy and fusion.   DESCRIPTION OF PROCEDURE:  The patient was brought to the operating room and  placed under general endotracheal anesthesia.  The patient was placed in 10  pounds of Holter traction.  The neck was prepped with Betadine soap and  solution and draped in a sterile fashion.  A horizontal incision was made in  the left side of the neck.  The line of the incision was infiltrated with  local anesthetic with epinephrine.  Dissection was carried down through the  subcutaneous tissue, bipolar cautery was used to maintain hemostasis.  The  platysma was then divided and then dissection was carried out through an  avascular plane, leaving the sternocleidomastoid, carotid artery, and  jugular vein  laterally and the trachea and esophagus medially.  The ventral  aspect of the vertebral column was identified and a localizing x-ray was  taken and the C3-4 intervertebral disk space identified.  Diskectomy was  begun with incision into the annulus and continued with microcurettes and  pituitary rongeurs.  The cartilaginous end plates of the corresponding  vertebrae were removed using microcurettes along with the Micro-Max drill.  Anterior osteophytic overgrowth was removed using the osteophyte removal  tool.  The microscope was draped and brought into the field to provide  additional magnification, illumination, and visualization, and the remainder  of the decompression was performed using microdissection and microsurgical  technique.  Posterior osteophytic overgrowth, particularly from the  posterior inferior aspect of C3, was removed using the Micro-Max drill along  with a 2 mm Kerrison punch with a thin foot plate.  The posterior  longitudinal ligament was divided and removed and we were able to decompress  the spinal canal and foramina.  Once decompression was completed hemostasis  was established with the use of Gelfoam soaked in thrombin.  We then  measured the size of the intervertebral disk space and an 8 mm graft was  selected.  The Gelfoam was removed and the graft was hydrated in saline  solution and positioned in the intervertebral disk space and counter sunk.  We then selected a 14 mm Tether cervical plate.  It was positioned over the  fusion construct and secured with a pair of 4 x 14 mm screws which were  placed in an alternating fashion in each of the vertebrae.  Each screw hole  was drilled and tapped and a screw placed.  All four screws were fully  tightened.  An x-ray was taken, which showed the graft, plate, and screws in  good position and the alignment was good, and then the wound was irrigated  with bacitracin solution, checked for hemostasis, which was established  and  confirmed, and then we proceeded with closure.  The platysma was closed with  interrupted, inverted 2-0 undyed Vicryl sutures, the subcutaneous and  subcuticular layer were closed with interrupted 3-0 undyed Vicryl sutures,  and the skin edges were reapproximated with Dermabond.  The wound was  dressed with sterile gauze and Hypafix and the patient was placed in an  Aspen cervical collar.  He was then reversed from the anesthetic, to be  extubated and transferred to the recovery room for further care.                                               Hosie Spangle, M.D.    RWN/MEDQ  D:  06/05/2003  T:  06/05/2003  Job:  [id number]

## 2010-11-22 NOTE — Consult Note (Signed)
[place]. [facility]  Patient:    Harold Martin, Harold Martin                     MRN: [id number] Adm. Date:  [id number] Attending:  Lawana Pai CC:         Jeanella Craze. Little, M.D.                          Consultation Report  HISTORY OF PRESENT ILLNESS:  Harold Martin is a very pleasant 70 year old gentleman who presented to the hospital with chest pain.  History is that he has been having episodes of chest pain similar to this for about one year now, but over the last three to four months, this has gotten progressively worse. He states that the pain can come on and last all day, and in fact, the episode that provoked his admission to the hospital on Monday started on Sunday.  He states that he was feeling chest tightness in church on Sunday, then went to a [employer] and ate lunch, and after lunch developed the chest pain and tightness in his chest, feeling like his breath was going to be cut off.  He went to bed that night, and woke in the middle of the night with the pain worsening.  He felt very ill and flushed in the face, and his wife called his primary care doctor, who suggested coming to the emergency room, which she did do, and he was admitted.  He subsequently had a cardiac evaluation by Dr. Rex Kras, which included coronary angiogram, and findings were of no severe obstruction to explain his pain.  Spiral CT was done to rule out pulmonary embolus, which was negative, and I have been asked to see him to evaluate this further.  The patient really gives no good etiology for his pain, in that he does not describe it in relation to any activities.  He was able to go on and do physical activity after his episode on Sunday passed, by washing his truck, and did not cause any worsening of his discomfort.  He does not describe dysphagia, but has, often-times, a sour stomach with a lot of gas, and acid reflux is described.  Additionally, he says he has  had similar pain to this, and very typically when he eats ice cream, he will get a feeling of his breath cutting off and the tightness across his chest, which is better if he avoids ice cream and cold foods, and is worse if he stretches his arms out.  He does not feel tenderness in the chest wall when it occurs.  Patient does not smoke. He did up until five years ago, and has gained considerable amount of weight in the last five years since he has stopped smoking.  Does not drink alcohol, never really has, and takes the medication Synthroid for thyroid disorder, Glucophage and Glucotrol for diabetes, and he has taken nothing for this acid stomach that he describes, nor for this chest pain, that has given him any relief.  As stated, if anything, over this period of time, he has gained weight.  He has not noticed any vomiting.  He has had no black stools.  Has had blood in his stools attributed to hemorrhoids.  Denies abdominal pain.  FAMILY HISTORY:  Notable for colon and rectal cancer in two to three relatives that he is aware of - mother, sister, and possibly also in  his father.  REVIEW OF SYSTEMS:  Otherwise negative.  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  PHYSICAL EXAMINATION:  GENERAL:  He is a well-developed, well-nourished, overweight man, very pleasant man, in no distress.  HEENT:  Unremarkable.  NECK:  Thyroid not enlarged to my examination.  No bruits are heard in the neck.  LUNGS:  Clear.  HEART:  Regular rhythm without murmur, gallop, or rub appreciated.  ABDOMEN:  Tender in the subxiphoid area.  Otherwise, unremarkable.  There is a scar from previous cholecystectomy he had many, many years ago.  RECTAL:  Deferred at this time.  IMPRESSION: 1. His chest pain certainly could be compatible with esophageal spasm, given    the fact that he has this same exact pain occur with the intake of ice    cream and causes him the same feeling as he got  this past Sunday after    eating a fatty lunch, and so, therefore, I agree with treatment with    proton pump inhibitors.  I think at this point we will do endoscopy to    evaluate further, and since it appears not to be a cardiac or pulmonary    process, then I believe he may be able to be discharged home    subsequently, if cardiology agrees. 2. He has a problem with diarrhea, and we did not pursue this in depth.  This    appears to be a chronic problem but intermittent.  He may have a normal    stool followed by a diarrheal stool.  Whether this is due to his    diabetes or to his diabetic medication may need to be looked into    further. 3. Additionally, and somewhat related to #2, is family history of colon    cancer.  With this strong a family history, given his age, he clearly    needs a colonoscopy, and will take that up with him at a later date, as    an outpatient.  For now, I agree with b.i.d. Prevacid, as ordered, and we will plan endoscopy in the morning. DD:  12/11/99 TD:  12/11/99 Job: [id number]

## 2010-11-22 NOTE — Procedures (Signed)
[facility]  Patient:    Harold Martin, Harold Martin                     MRN: [id number] Proc. Date: 02/21/00 Adm. Date:  [id number] Attending:  Jim Desanctis                           Procedure Report  PROCEDURE:  Colonoscopy.  INDICATION FOR PROCEDURE:  Hemoccult positivity, family history of colon cancer.  ANESTHESIA:  Demerol 70 mg, Versed 6 mg were given intravenously in divided dose.  DESCRIPTION OF PROCEDURE:  With the patient mildly sedated in the left lateral decubitus position, the Olympus videoscopic colonoscope was inserted in the rectum after a normal rectal examination and passed under direct vision into the cecum. The cecum was identified by the ileocecal valve and appendiceal orifice both of which were photographed. From this point, the colonoscope was slowly withdrawn taking circumferential views of the entire colonic mucosa, stopping only in the rectum which appeared normal view and showed small internal hemorrhoids on retroflexed view. The endoscope was straightened and withdrawn. The patients vital signs and pulse oximeter remained stable. The patient tolerated the procedure well without apparent complications.  FINDINGS:  Small internal hemorrhoids, otherwise unremarkable colonoscopic examination to the cecum.  PLAN:  Repeat exam in 5 years. DD:  02/21/00 TD:  02/22/00 Job: [id number]

## 2010-11-22 NOTE — Discharge Summary (Signed)
NAME:  Harold Martin, Harold Martin              ACCOUNT NO.:  [id number]   MEDICAL RECORD NO.:  [id number]          PATIENT TYPE:  OBV   LOCATION:  [id number]                         FACILITY:  [facility]   PHYSICIAN:  Reece Packer, MD DATE OF BIRTH:  1941-04-20   DATE OF ADMISSION:  10/15/2005  DATE OF DISCHARGE:  10/16/2005                                 DISCHARGE SUMMARY   ADMISSION DIAGNOSIS:  Infection following BCG treatment for bladder cancer.   DISCHARGE DIAGNOSIS:  Infection following BCG treatment for bladder cancer.   Mr. [name] is a gentleman with bladder cancer and 12 hours after his  second BCG treatment he had fever. I saw him in the emergency department and  his temperature was elevated, and he felt very warm. He had a lot of  dysuria. I thought it was best to keep him overnight for IV antibiotics.  There was a history of traumatic catheterization.   In hospital he was afebrile except for one occasion with temperatures 100.2.  When they discharged him home it was 99.2. He felt great all day. His  burning has slowed down. I sent him home with two weeks of ciprofloxacin. I  am going to have Gwen call him tomorrow and make certain that his urine  cultures and blood cultures are normal, or if not he is being treated with  the appropriate antibiotics.   His white blood cell count was normal.   Mr. [name] will follow up with Dr. Diona Fanti in two weeks' time.           ______________________________  Reece Packer, MD  Electronically Signed     SAM/MEDQ  D:  10/16/2005  T:  10/17/2005  Job:  [id number]   cc:   Lillette Boxer. Dahlstedt, M.D.  Fax: [phone]

## 2010-11-22 NOTE — Op Note (Signed)
NAME:  Harold Martin, SEWARD NO.:  [id number]   MEDICAL RECORD NO.:  [id number]          PATIENT TYPE:  AMB   LOCATION:  NESC                         FACILITY:  [facility]   PHYSICIAN:  Lillette Boxer. Dahlstedt, M.D.DATE OF BIRTH:  Sep 02, 1940   DATE OF PROCEDURE:  01/26/2006  DATE OF DISCHARGE:                                 OPERATIVE REPORT   PREOPERATIVE DIAGNOSIS:  History of transitional cell carcinoma of the  bladder, high-grade.   POSTOPERATIVE DIAGNOSIS:  History of transitional cell carcinoma of the  bladder, high-grade.   PROCEDURE:  Cystoscopy, bladder biopsy.   SURGEON:  Lillette Boxer. Dahlstedt, M.D.   ANESTHESIA:  General with LMA.   COMPLICATIONS:  None.   SPECIMENS:  Bladder biopsies x4.   ESTIMATED BLOOD:  Minimal.   BRIEF HISTORY:  A 70 year old gentleman with a history of a TURBT in March  of this year.  He underwent postoperative BCG.  A 2-3 cm, stage T1  transitional cell, with grades 2 and 3.  There was minimal lamina propria  invasion.   He underwent BCG therapy.  He initially did fairly well with this.  He has  completed the course, and comes back now for follow-up.  He has been  asymptomatic from a bladder standpoint, passing no blood and having no  dysuria.  He is aware of the risks and complications of cysto and bladder  biopsy and desires to proceed.   DESCRIPTION OF PROCEDURE:  The patient was administered preoperative IV  antibiotics in the holding area, identified, and was taken to the operating  room where a general anesthetic was administered.  He was placed in the  dorsal lithotomy position.  The genitalia and perineum were prepped and  draped.  A 22-French panendoscope was passed directly through his urethra.  The urethra was normal, prostate was not obstructed.  The bladder was  entered and inspected circumferentially.  There were minimal trabeculations.  There were no obvious tumors.  There was slightly erythematous mucosa in the  dome and the posterior area of the bladder.  The resected area in the left  wall appeared free of any tumors.  Circumferential inspection of the bladder  revealed basically normal urothelium.  Bladder biopsies were taken x2 and  the area that had some mild erythema.  These were taken with the cold cup  biopsy cancer instruments.  Additionally deep biopsies were taken of the  resected area.  The Bugbee electrode was used to cauterize biopsy sites.  At  this point, the bladder was drained and the scope removed.  The patient  tolerated the procedure well.  He was awakened and taken to the PACU in  stable condition.   The patient will follow-up in approximately 2 weeks. He is discharged on  Keflex 500 mg 1 p.o. b.i.d. for 5 days and Urelle 1 p.o. q.6h. p.r.n.  urinary burning and pain #15.      Lillette Boxer. Dahlstedt, M.D.  Electronically Signed     SMD/MEDQ  D:  01/26/2006  T:  01/26/2006  Job:  [id number]   cc:   Ronaldo Miyamoto,  M.D.  Fax: [phone]

## 2010-11-22 NOTE — H&P (Signed)
NAME:  Harold Martin, Harold Martin              ACCOUNT NO.:  [id number]   MEDICAL RECORD NO.:  [id number]          PATIENT TYPE:  OBV   LOCATION:  0101                         FACILITY:  [facility]   PHYSICIAN:  Reece Packer, MD DATE OF BIRTH:  Nov 05, 1940   DATE OF ADMISSION:  10/15/2005  DATE OF DISCHARGE:                                HISTORY & PHYSICAL   ADMISSION DIAGNOSIS:  Fever with probable urinary tract infection.   HISTORY OF PRESENT ILLNESS:  Harold Martin has bladder cancer and had a second  VCG bladder installation treatment yesterday.  He described traumatic  catheterization with a catheter followed by easy placement of a clear  catheter.  The VCG treatment was held for 2 hours.  At 2 o'clock in the  morning he felt poor and has had fever and is burning up all day.  He has  significant dysuria but no back pain.   PAST HISTORY:  Cancer of the bladder, erectile dysfunction, diabetes,  hypothyroid.   FAMILY HISTORY:  Cancer.   SOCIAL HISTORY:  Married, lives in [place].   DRUG ALLERGIES:  NONE.   MEDICATIONS:  Glucotrol, Synthroid, Lipitor, Actos, Nexium.   REVIEW OF SYSTEMS:  Rest of review of systems is negative.   PHYSICAL EXAMINATION:  VITAL SIGNS:  Temperature 100.6, pulse 110, blood  pressure 181/92.  GENERAL:  Nontoxic.  CHEST:  Breathing quietly.  CARDIAC:  Extremities warm and dry.  ABDOMEN:  No tenderness.  GU:  Normal genitalia.  No epididymitis.  EXTREMITIES:  Normal gait and arm strength.  SKIN:  No rashes.  NEUROLOGIC:  Normal sensation to touch.   LABORATORIES:  Pending.   IMPRESSION:  Harold Martin has dysuria and fever following voiding cystogram  treatment.  He had a history of traumatic catheterization.  I have ordered  blood cultures x2 and urine culture.  I am going to admit him on gentamicin  and Ancef and keep in the hospital for observation, especially with the  history of traumatic catheterization in the face of voiding cystogram  treatment.   I am going to check a postvoid residual on the floor and  make sure that he is emptying reasonably well.  I am not going to get a  renal ultrasound at this point in time.  I am not going to start any  treatment for possible voiding cystogram-related infection at this point in  time.  Overall, Harold Martin looks quite good but I thought it was best to  observe him for the next 24 hours or longer in the hospital.           ______________________________  Reece Packer, MD  Electronically Signed     SAM/MEDQ  D:  10/15/2005  T:  10/15/2005  Job:  [id number]

## 2010-11-22 NOTE — H&P (Signed)
Harold Martin. [facility]  Patient:    Harold, Martin                       MRN: [id number] Adm. Date:  12/09/99 Attending:  Jeanella Craze. Little, M.D. Dictator:   Shon Baton, P.A. CC:         Arleta Creek, M.D.             Jeanella Craze. Little, M.D.                         History and Physical  CHIEF COMPLAINT:  Chest pain times three months.  HISTORY OF PRESENT ILLNESS:  The patient is a 70 year old white male with no known CAD, with three months of chest pain in her left breast, expressed as "tearing/  sticking" with weakness, diaphoresis, lightheadedness and nausea.  Occasionally  loses balance with walking during episodes.  Each episode lasts approximately ten minutes to an hour, with tingling in the left hand and leg today.  On the average, two to three times q.d. and eight times a week, with exertion or riding in truck.  Had a Persantine Cardiolite on 11/22/99 read by Dr. Wynonia Lawman and indicated distal  inferior and ______ lateral ischemia with an EF of 52%  Was instructed to set up appointment with cardiac specialist for evaluation, but nurse did not return call for several days, then told patient would need referral from primary care physician.  This a.m., wife noted patient to be flushed and pale as he was leaving to go to work.  Wife called primary care physician and was instructed to go to he ER.  The patients worst pain was 10/10; currently 0/10 after given one sublingual nitroglycerin.  PAST MEDICAL HISTORY:  Diabetes x 5 years.  Hypothyroidism x 6 years.  Obesity. No prior CAD or cath evaluation except for Persantine Cardiolite.  History of prostatitis.  CURRENT MEDICATIONS:  Synthroid 150 mg one p.o. q.d., Glucophage 500 mg b.i.d.,  Glucotrol 10 mg b.i.d. and Tylenol p.r.n.  ALLERGIES:  None.  FAMILY HISTORY:  Mother deceased, age 93; colon cancer and hypertension. Father deceased, age 70; prostate cancer, diabetes and  hypertension.  Sister alive, age 37; with colon cancer.  No known cardiac disease in extended family.  SOCIAL HISTORY:  Married for 55 years; father of three/grandfather of six/great- grandfather of nine.  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  Denies recent tobacco;  quit five years ago.  No alcohol abuse.  No aerobic activity.  Maintains low salt, low fried food, low red meat, low sugar diet.  REVIEW OF SYSTEMS:  Positive for diarrhea times one year.  Positive for hemorrhoids.  Positive for occasional urgency and frequency during the day. Negative for caffeine.  Positive for occasional shortness of breath with quick postural changes over the last three months; not associated with chest pain.  PHYSICAL EXAMINATION:  VITAL SIGNS:  Blood pressure 118/76, pulse 92, respirations 17; currently on two liters by nasal cannula.  HEENT:  [place]/AT.  PERRLA.  EOMI.  Patent nares.  No LAD.  Thyroid nonpalpable. No bruits.  HEART:  Regular rate and rhythm without gross murmurs, rubs or gallops.  LUNGS:  Clear to auscultation bilaterally without rales, wheezes or rhonchi.  ABDOMEN:  Soft, nontender/nondistended, with positive bowel sounds in all four quadrants.  No bruits heard.  EXTREMITIES:  Without edema; 2+ distal pulses x 4.  EKG:  Nonspecific ST-T changes in  2, 3 and F and V4 through 6.  LABORATORY DATA:  Sodium 134, potassium 3.9, CO2 was 101, chloride 28, BUN 12, creatinine 0.8 and glucose 94.  Initial CK:  Total 194, MB 1.6, index 0.8 and Troponin less than 0.03.  White blood count 6, hemoglobin 12.8, hematocrit 35.6 and platelets 161,000.  Chest x-ray negative for acute process.  PLAN:  Admit to telemetry and start IV heparin and nitroglycerin, low-dose beta- blocker and aspirin.  Schedule cardiac cath tomorrow, approximately 1 p.m. with Dr. Aldona Bar.  Will check serial cardiac enzymes and lipid panel with routine labs. DD:  12/09/99 TD:  12/09/99 Job:  [id number]

## 2013-08-17 ENCOUNTER — Other Ambulatory Visit: Payer: Self-pay | Admitting: Gastroenterology

## 2013-08-19 ENCOUNTER — Encounter (HOSPITAL_COMMUNITY): Payer: Self-pay | Admitting: *Deleted

## 2013-08-19 ENCOUNTER — Encounter (HOSPITAL_COMMUNITY): Payer: Self-pay | Admitting: Pharmacy Technician

## 2013-09-09 ENCOUNTER — Ambulatory Visit (HOSPITAL_COMMUNITY): Admission: RE | Admit: 2013-09-09 | Payer: Medicare Other | Source: Ambulatory Visit | Admitting: Gastroenterology

## 2013-09-09 HISTORY — DX: Hypothyroidism, unspecified: E03.9

## 2013-09-09 HISTORY — DX: Shortness of breath: R06.02

## 2013-09-09 HISTORY — DX: Type 2 diabetes mellitus without complications: E11.9

## 2013-09-09 HISTORY — DX: Essential (primary) hypertension: I10

## 2013-09-09 SURGERY — EGD (ESOPHAGOGASTRODUODENOSCOPY)
Anesthesia: Monitor Anesthesia Care

## 2013-09-12 ENCOUNTER — Other Ambulatory Visit: Payer: Self-pay | Admitting: Gastroenterology

## 2013-09-23 ENCOUNTER — Encounter (HOSPITAL_COMMUNITY): Payer: Self-pay

## 2013-09-23 ENCOUNTER — Encounter (HOSPITAL_COMMUNITY)
Admission: RE | Disposition: A | Discharge status: Home / Self Care | Payer: Self-pay | Source: Ambulatory Visit | Attending: Gastroenterology

## 2013-09-23 ENCOUNTER — Ambulatory Visit (HOSPITAL_COMMUNITY)
Admission: RE | Admit: 2013-09-23 | Discharge: 2013-09-23 | Disposition: A | Discharge status: Home / Self Care | Payer: Medicare Other | Source: Ambulatory Visit | Attending: Gastroenterology | Admitting: Gastroenterology

## 2013-09-23 ENCOUNTER — Encounter (HOSPITAL_COMMUNITY): Payer: Medicare Other | Admitting: Certified Registered Nurse Anesthetist

## 2013-09-23 ENCOUNTER — Ambulatory Visit (HOSPITAL_COMMUNITY): Payer: Medicare Other | Admitting: Certified Registered Nurse Anesthetist

## 2013-09-23 DIAGNOSIS — Z8551 Personal history of malignant neoplasm of bladder: Secondary | ICD-10-CM | POA: Insufficient documentation

## 2013-09-23 DIAGNOSIS — K5520 Angiodysplasia of colon without hemorrhage: Secondary | ICD-10-CM | POA: Insufficient documentation

## 2013-09-23 DIAGNOSIS — J4489 Other specified chronic obstructive pulmonary disease: Secondary | ICD-10-CM | POA: Insufficient documentation

## 2013-09-23 DIAGNOSIS — E119 Type 2 diabetes mellitus without complications: Secondary | ICD-10-CM | POA: Insufficient documentation

## 2013-09-23 DIAGNOSIS — E039 Hypothyroidism, unspecified: Secondary | ICD-10-CM | POA: Insufficient documentation

## 2013-09-23 DIAGNOSIS — D126 Benign neoplasm of colon, unspecified: Secondary | ICD-10-CM | POA: Insufficient documentation

## 2013-09-23 DIAGNOSIS — K5730 Diverticulosis of large intestine without perforation or abscess without bleeding: Secondary | ICD-10-CM | POA: Insufficient documentation

## 2013-09-23 DIAGNOSIS — E669 Obesity, unspecified: Secondary | ICD-10-CM | POA: Insufficient documentation

## 2013-09-23 DIAGNOSIS — D509 Iron deficiency anemia, unspecified: Secondary | ICD-10-CM | POA: Insufficient documentation

## 2013-09-23 DIAGNOSIS — I10 Essential (primary) hypertension: Secondary | ICD-10-CM | POA: Insufficient documentation

## 2013-09-23 DIAGNOSIS — J449 Chronic obstructive pulmonary disease, unspecified: Secondary | ICD-10-CM | POA: Insufficient documentation

## 2013-09-23 DIAGNOSIS — F17200 Nicotine dependence, unspecified, uncomplicated: Secondary | ICD-10-CM | POA: Insufficient documentation

## 2013-09-23 HISTORY — PX: COLONOSCOPY: SHX5424

## 2013-09-23 HISTORY — PX: ESOPHAGOGASTRODUODENOSCOPY: SHX5428

## 2013-09-23 LAB — GLUCOSE, CAPILLARY: Glucose-Capillary: 149 mg/dL — ABNORMAL HIGH (ref 70–99)

## 2013-09-23 SURGERY — EGD (ESOPHAGOGASTRODUODENOSCOPY)
Anesthesia: Monitor Anesthesia Care

## 2013-09-23 MED ORDER — SODIUM CHLORIDE 0.9 % IJ SOLN
INTRAMUSCULAR | Status: AC
  Filled 2013-09-23: qty 10

## 2013-09-23 MED ORDER — PROPOFOL 10 MG/ML IV BOLUS
INTRAVENOUS | Status: AC
  Filled 2013-09-23: qty 20

## 2013-09-23 MED ORDER — PROPOFOL 10 MG/ML IV BOLUS
INTRAVENOUS | Status: DC | PRN
  Administered 2013-09-23 (×2): 20 mg via INTRAVENOUS

## 2013-09-23 MED ORDER — PROPOFOL 10 MG/ML IV BOLUS
INTRAVENOUS | Status: AC
Start: 2013-09-23 — End: 2013-09-23
  Filled 2013-09-23: qty 20

## 2013-09-23 MED ORDER — MIDAZOLAM HCL 10 MG/2ML IJ SOLN
INTRAMUSCULAR | Status: AC
  Filled 2013-09-23: qty 2

## 2013-09-23 MED ORDER — FENTANYL CITRATE 0.05 MG/ML IJ SOLN
INTRAMUSCULAR | Status: AC
  Filled 2013-09-23: qty 2

## 2013-09-23 MED ORDER — ONDANSETRON HCL 4 MG/2ML IJ SOLN
INTRAMUSCULAR | Status: DC | PRN
  Administered 2013-09-23: 4 mg via INTRAVENOUS

## 2013-09-23 MED ORDER — LABETALOL HCL 5 MG/ML IV SOLN
INTRAVENOUS | Status: DC | PRN
  Administered 2013-09-23: 5 mg via INTRAVENOUS

## 2013-09-23 MED ORDER — ONDANSETRON HCL 4 MG/2ML IJ SOLN
INTRAMUSCULAR | Status: AC
  Filled 2013-09-23: qty 2

## 2013-09-23 MED ORDER — EPHEDRINE SULFATE 50 MG/ML IJ SOLN
INTRAMUSCULAR | Status: AC
  Filled 2013-09-23: qty 1

## 2013-09-23 MED ORDER — KETAMINE HCL 10 MG/ML IJ SOLN
INTRAMUSCULAR | Status: AC
  Filled 2013-09-23: qty 1

## 2013-09-23 MED ORDER — SODIUM CHLORIDE 0.9 % IV SOLN
INTRAVENOUS | Status: DC
  Administered 2013-09-23: 500 mL via INTRAVENOUS

## 2013-09-23 MED ORDER — LACTATED RINGERS IV SOLN
INTRAVENOUS | Status: DC | PRN
  Administered 2013-09-23: 14:00:00 via INTRAVENOUS

## 2013-09-23 MED ORDER — LIDOCAINE HCL (CARDIAC) 20 MG/ML IV SOLN
INTRAVENOUS | Status: AC
  Filled 2013-09-23: qty 5

## 2013-09-23 MED ORDER — LIDOCAINE HCL (CARDIAC) 20 MG/ML IV SOLN
INTRAVENOUS | Status: DC | PRN
  Administered 2013-09-23: 70 mg via INTRAVENOUS

## 2013-09-23 MED ORDER — PROPOFOL INFUSION 10 MG/ML OPTIME
INTRAVENOUS | Status: DC | PRN
  Administered 2013-09-23: 75 ug/kg/min via INTRAVENOUS

## 2013-09-23 MED ORDER — KETAMINE HCL 10 MG/ML IJ SOLN
INTRAMUSCULAR | Status: DC | PRN
  Administered 2013-09-23: 10 mg via INTRAVENOUS
  Administered 2013-09-23: 20 mg via INTRAVENOUS

## 2013-09-23 NOTE — Preoperative (Signed)
Beta Blockers   Reason not to administer Beta Blockers:Not Applicable 

## 2013-09-23 NOTE — Op Note (Signed)
[facility] [place], [address]   OPERATIVE PROCEDURE REPORT  PATIENT: Harold Martin, Harold Martin  MR#: [id number] BIRTHDATE: Aug 02, 1940  GENDER: Male ENDOSCOPIST: Carol Ada, MD ASSISTANT:   William Dalton, technician and Gunnar Fusi, RN PROCEDURE DATE: 09/23/2013 PROCEDURE:   EGD with APC ASA CLASS:   Class III INDICATIONS: Iron Deficiency Anemia MEDICATIONS: MAC sedation, administered by CRNA TOPICAL ANESTHETIC:   none  DESCRIPTION OF PROCEDURE:   After the risks benefits and alternatives of the procedure were thoroughly explained, informed consent was obtained.  The Pentax Gastroscope [id number]  endoscope was introduced through the mouth  and advanced to the third portion of the duodenum Without limitations.      The instrument was slowly withdrawn as the mucosa was fully examined.      FINDINGS: The esophagus was normal.  In the antrum a nonbleeding medium-sized AVM was identified.  Other fainter nonclassic appearing AVMs were identified and all the identified lesions were ablated with APC.  The duodenum was normal.   Retroflexed views revealed no abnormalities.     The scope was then withdrawn from the patient and the procedure terminated.  COMPLICATIONS: There were no complications. IMPRESSION: 1) Antral AVMs.  RECOMMENDATIONS: 1) Follow up in the office in 3 months to check CBC and iron panel. 2) If the anemia persists, a capsule endoscopy will be pursued.   _______________________________ eSignedCarol Ada, MD 09/23/2013 2:36 PM

## 2013-09-23 NOTE — Anesthesia Preprocedure Evaluation (Addendum)
Anesthesia Evaluation  Patient identified by MRN, date of birth, ID band Patient awake    Reviewed: Allergy & Precautions, H&P , NPO status , Patient's Chart, lab work & pertinent test results  Airway Mallampati: II TM Distance: >3 FB Neck ROM: Full    Dental  (+) Edentulous Upper, Edentulous Lower   Pulmonary shortness of breath and with exertion, COPD COPD inhaler,  breath sounds clear to auscultation  Pulmonary exam normal       Cardiovascular Exercise Tolerance: Good hypertension, Pt. on medications Rhythm:Regular Rate:Normal     Neuro/Psych negative neurological ROS  negative psych ROS   GI/Hepatic negative GI ROS, Neg liver ROS,   Endo/Other  diabetes, Type 2, Oral Hypoglycemic AgentsHypothyroidism   Renal/GU negative Renal ROS  negative genitourinary   Musculoskeletal negative musculoskeletal ROS (+)   Abdominal (+) + obese,   Peds negative pediatric ROS (+)  Hematology negative hematology ROS (+)   Anesthesia Other Findings   Reproductive/Obstetrics negative OB ROS                          Anesthesia Physical Anesthesia Plan  ASA: III  Anesthesia Plan: MAC   Post-op Pain Management:    Induction: Intravenous  Airway Management Planned:   Additional Equipment:   Intra-op Plan:   Post-operative Plan:   Informed Consent: I have reviewed the patients History and Physical, chart, labs and discussed the procedure including the risks, benefits and alternatives for the proposed anesthesia with the patient or authorized representative who has indicated his/her understanding and acceptance.   Dental advisory given  Plan Discussed with: CRNA  Anesthesia Plan Comments: (Anesthesia asked by Dr. Benson Norway to provide MAC anesthesia to Mr. Wegleitner. Mr. Kroenke consents to [facility].)       Anesthesia Quick Evaluation

## 2013-09-23 NOTE — Discharge Instructions (Signed)
Conscious Sedation, Adult, Care After °Refer to this sheet in the next few weeks. These instructions provide you with information on caring for yourself after your procedure. Your health care provider may also give you more specific instructions. Your treatment has been planned according to current medical practices, but problems sometimes occur. Call your health care provider if you have any problems or questions after your procedure. °WHAT TO EXPECT AFTER THE PROCEDURE  °After your procedure: °· You may feel sleepy, clumsy, and have poor balance for several hours. °· Vomiting may occur if you eat too soon after the procedure. °HOME CARE INSTRUCTIONS °· Do not participate in any activities where you could become injured for at least 24 hours. Do not: °· Drive. °· Swim. °· Ride a bicycle. °· Operate heavy machinery. °· Cook. °· Use power tools. °· Climb ladders. °· Work from a high place. °· Do not make important decisions or sign legal documents until you are improved. °· If you vomit, drink water, juice, or soup when you can drink without vomiting. Make sure you have little or no nausea before eating solid foods. °· Only take over-the-counter or prescription medicines for pain, discomfort, or fever as directed by your health care provider. °· Make sure you and your family fully understand everything about the medicines given to you, including what side effects may occur. °· You should not drink alcohol, take sleeping pills, or take medicines that cause drowsiness for at least 24 hours. °· If you smoke, do not smoke without supervision. °· If you are feeling better, you may resume normal activities 24 hours after you were sedated. °· Keep all appointments with your health care provider. °SEEK MEDICAL CARE IF: °· Your skin is pale or bluish in color. °· You continue to feel nauseous or vomit. °· Your pain is getting worse and is not helped by medicine. °· You have bleeding or swelling. °· You are still sleepy or  feeling clumsy after 24 hours. °SEEK IMMEDIATE MEDICAL CARE IF: °· You develop a rash. °· You have difficulty breathing. °· You develop any type of allergic problem. °· You have a fever. °MAKE SURE YOU: °· Understand these instructions. °· Will watch your condition. °· Will get help right away if you are not doing well or get worse. °Document Released: 04/13/2013 Document Reviewed: 01/28/2013 °Exit[employer]® Patient Information ©2014 [employer]. ° °

## 2013-09-23 NOTE — Transfer of Care (Signed)
Immediate Anesthesia Transfer of Care Note  Patient: Harold Martin  Procedure(s) Performed: Procedure(s) (LRB): ESOPHAGOGASTRODUODENOSCOPY (EGD) (N/A) COLONOSCOPY (N/A)  Patient Location: PACU  Anesthesia Type: MAC  Level of Consciousness: sedated, patient cooperative and responds to stimulation  Airway & Oxygen Therapy: Patient Spontanous Breathing and Patient connected to face mask oxgen  Post-op Assessment: Report given to PACU RN and Post -op Vital signs reviewed and stable  Post vital signs: Reviewed and stable  Complications: No apparent anesthesia complications

## 2013-09-23 NOTE — Op Note (Signed)
[facility] [place], [address]   OPERATIVE PROCEDURE REPORT  PATIENT: Harold Martin, Harold Martin  MR#: [id number] BIRTHDATE: [birth year]  GENDER: Male ENDOSCOPIST: Carol Ada, MD ASSISTANT:   William Dalton, technician Gunnar Fusi, RN PROCEDURE DATE: 09/23/2013 PROCEDURE:   Colonoscopy with ablation ASA CLASS:   Class III INDICATIONS:IDA MEDICATIONS: MAC sedation, administered by CRNA  DESCRIPTION OF PROCEDURE:   After the risks benefits and alternatives of the procedure were thoroughly explained, informed consent was obtained.  A digital rectal exam revealed no abnormalities of the rectum.    The Pentax Colonoscope [id number] endoscope was introduced through the anus  and advanced to the cecum, which was identified by both the appendix and ileocecal valve , No adverse events experienced.    The quality of the prep was good. .  The instrument was then slowly withdrawn as the colon was fully examined.     FINDINGS: In the proximal ascending colon a moderately-sized faint, nonbleeding AVM was identified.  APC was applied to the area.  In the sigmoid colon a 3 mm sessile polyp was removed with a cold snare.  Left sided diverticula were identified.          The scope was then withdrawn from the patient and the procedure terminated.  COMPLICATIONS: There were no complications.  IMPRESSION: 1) Nonbleeding ascending colon AVM. 2) Sigmoid colon polyp. 3) Diverticula.  RECOMMENDATIONS: 1) Await biopsy results. 2) Possible repeat colonoscopy in 5 years.  _______________________________ eSignedCarol Ada, MD 09/23/2013 2:31 PM

## 2013-09-23 NOTE — H&P (Signed)
  Thane Edu HPI: This is a 73 year old male with a history of IDA since August 2014.  His HGB dropped from the 11 range down to the 9 range and his MCV wasnoted to be at 77.  His blood work also revealed a ferritin of 11 and a lower serum iron.  No reports of hematochezia, melena, ABM pain, nausea, vomiting, diarrhea, or constipation.  In the past he has a history of bladder cancer that did result in hematuria, but this is not an issue for him at this time.  Dr. Lajoyce Corners performed EGDs and colonoscopies in 2001, 2004, and 2009.  Each of the colonoscopies revealed hyperplastic polyps.  In 2001 his EGD was positive for Barrett's, but the gross findings were minima.  Subsequently, the biopsies of the area were negative for Barrett's esophagus.  No current issues with chest pain or MI, however, he has severe emphysema.  Past Medical History  Diagnosis Date  . Hypertension   . Diabetes mellitus without complication   . Hypothyroidism   . Shortness of breath     Past Surgical History  Procedure Laterality Date  . Surgery for bladder cancer  yrs ago    History reviewed. No pertinent family history.  Social History:  reports that he has never smoked. His smokeless tobacco use includes Chew. He reports that he does not drink alcohol or use illicit drugs.  Allergies:  Allergies  Allergen Reactions  . Prednisone     REACTION: Reaction not known    Medications:  Scheduled:  Continuous: . sodium chloride      No results found for this or any previous visit (from the past 24 hour(s)).   No results found.  ROS:  As stated above in the HPI otherwise negative.  Blood pressure 150/81, pulse 84, temperature 98.3 F (36.8 C), temperature source Oral, resp. rate 20, height 5\' 10"  (1.778 m), weight 244 lb (110.678 kg), SpO2 98.00%.    PE: Gen: NAD, Alert and Oriented HEENT:  [place]/AT, EOMI Neck: Supple, no LAD Lungs: CTA Bilaterally CV: RRR without M/G/R ABM: Soft, NTND, +BS Ext: No  C/C/E  Assessment/Plan: 1) IDA.  Plan: 1) EGD/Colonoscopy.  Bastien Strawser D 09/23/2013, 12:01 PM

## 2013-09-26 ENCOUNTER — Encounter (HOSPITAL_COMMUNITY): Payer: Self-pay | Admitting: Gastroenterology

## 2013-09-26 NOTE — Anesthesia Postprocedure Evaluation (Signed)
  Anesthesia Post-op Note  Patient: Harold Martin  Procedure(s) Performed: Procedure(s) (LRB): ESOPHAGOGASTRODUODENOSCOPY (EGD) (N/A) COLONOSCOPY (N/A)  Patient Location: PACU  Anesthesia Type: MAC  Level of Consciousness: awake and alert   Airway and Oxygen Therapy: Patient Spontanous Breathing  Post-op Pain: mild  Post-op Assessment: Post-op Vital signs reviewed, Patient's Cardiovascular Status Stable, Respiratory Function Stable, Patent Airway and No signs of Nausea or vomiting  Last Vitals:  Filed Vitals:   09/23/13 1505  BP: 189/95  Pulse:   Temp:   Resp: 16    Post-op Vital Signs: stable   Complications: No apparent anesthesia complications

## 2013-10-05 ENCOUNTER — Emergency Department (HOSPITAL_COMMUNITY): Payer: Medicare Other

## 2013-10-05 ENCOUNTER — Encounter (HOSPITAL_COMMUNITY): Payer: Self-pay | Admitting: Emergency Medicine

## 2013-10-05 ENCOUNTER — Other Ambulatory Visit: Payer: Self-pay

## 2013-10-05 ENCOUNTER — Inpatient Hospital Stay (HOSPITAL_COMMUNITY)
Admission: EM | Admit: 2013-10-05 | Discharge: 2013-10-08 | DRG: 378 | Disposition: A | Discharge status: Home / Self Care | Payer: Medicare Other | Attending: Internal Medicine | Admitting: Internal Medicine

## 2013-10-05 DIAGNOSIS — R404 Transient alteration of awareness: Secondary | ICD-10-CM | POA: Diagnosis not present

## 2013-10-05 DIAGNOSIS — I129 Hypertensive chronic kidney disease with stage 1 through stage 4 chronic kidney disease, or unspecified chronic kidney disease: Secondary | ICD-10-CM | POA: Diagnosis present

## 2013-10-05 DIAGNOSIS — IMO0001 Reserved for inherently not codable concepts without codable children: Secondary | ICD-10-CM | POA: Diagnosis present

## 2013-10-05 DIAGNOSIS — E1165 Type 2 diabetes mellitus with hyperglycemia: Secondary | ICD-10-CM

## 2013-10-05 DIAGNOSIS — K922 Gastrointestinal hemorrhage, unspecified: Principal | ICD-10-CM | POA: Diagnosis present

## 2013-10-05 DIAGNOSIS — E785 Hyperlipidemia, unspecified: Secondary | ICD-10-CM | POA: Diagnosis present

## 2013-10-05 DIAGNOSIS — N179 Acute kidney failure, unspecified: Secondary | ICD-10-CM | POA: Diagnosis present

## 2013-10-05 DIAGNOSIS — E872 Acidosis, unspecified: Secondary | ICD-10-CM | POA: Diagnosis present

## 2013-10-05 DIAGNOSIS — F17200 Nicotine dependence, unspecified, uncomplicated: Secondary | ICD-10-CM | POA: Diagnosis present

## 2013-10-05 DIAGNOSIS — R0989 Other specified symptoms and signs involving the circulatory and respiratory systems: Secondary | ICD-10-CM | POA: Diagnosis present

## 2013-10-05 DIAGNOSIS — Z8551 Personal history of malignant neoplasm of bladder: Secondary | ICD-10-CM

## 2013-10-05 DIAGNOSIS — E119 Type 2 diabetes mellitus without complications: Secondary | ICD-10-CM

## 2013-10-05 DIAGNOSIS — R0609 Other forms of dyspnea: Secondary | ICD-10-CM | POA: Diagnosis present

## 2013-10-05 DIAGNOSIS — E039 Hypothyroidism, unspecified: Secondary | ICD-10-CM | POA: Diagnosis present

## 2013-10-05 DIAGNOSIS — E871 Hypo-osmolality and hyponatremia: Secondary | ICD-10-CM | POA: Diagnosis present

## 2013-10-05 DIAGNOSIS — N182 Chronic kidney disease, stage 2 (mild): Secondary | ICD-10-CM | POA: Diagnosis present

## 2013-10-05 DIAGNOSIS — Z79899 Other long term (current) drug therapy: Secondary | ICD-10-CM

## 2013-10-05 DIAGNOSIS — N189 Chronic kidney disease, unspecified: Secondary | ICD-10-CM

## 2013-10-05 DIAGNOSIS — Z888 Allergy status to other drugs, medicaments and biological substances status: Secondary | ICD-10-CM

## 2013-10-05 DIAGNOSIS — D649 Anemia, unspecified: Secondary | ICD-10-CM

## 2013-10-05 DIAGNOSIS — IMO0002 Reserved for concepts with insufficient information to code with codable children: Secondary | ICD-10-CM | POA: Diagnosis not present

## 2013-10-05 DIAGNOSIS — D500 Iron deficiency anemia secondary to blood loss (chronic): Secondary | ICD-10-CM | POA: Diagnosis present

## 2013-10-05 DIAGNOSIS — I10 Essential (primary) hypertension: Secondary | ICD-10-CM | POA: Diagnosis present

## 2013-10-05 LAB — URINE MICROSCOPIC-ADD ON

## 2013-10-05 LAB — COMPREHENSIVE METABOLIC PANEL
ALBUMIN: 3.6 g/dL (ref 3.5–5.2)
ALK PHOS: 79 U/L (ref 39–117)
ALT: 13 U/L (ref 0–53)
AST: 16 U/L (ref 0–37)
BUN: 38 mg/dL — ABNORMAL HIGH (ref 6–23)
CO2: 20 mEq/L (ref 19–32)
Calcium: 8.4 mg/dL (ref 8.4–10.5)
Chloride: 100 mEq/L (ref 96–112)
Creatinine, Ser: 2.46 mg/dL — ABNORMAL HIGH (ref 0.50–1.35)
GFR calc Af Amer: 29 mL/min — ABNORMAL LOW (ref >90)
GFR calc non Af Amer: 25 mL/min — ABNORMAL LOW (ref >90)
Glucose, Bld: 244 mg/dL — ABNORMAL HIGH (ref 70–99)
POTASSIUM: 5.1 meq/L (ref 3.7–5.3)
SODIUM: 132 meq/L — AB (ref 137–147)
Total Protein: 6.2 g/dL (ref 6.0–8.3)

## 2013-10-05 LAB — CBC
HEMATOCRIT: 22.7 % — AB (ref 39.0–52.0)
Hemoglobin: 6.9 g/dL — CL (ref 13.0–17.0)
MCH: 23 pg — ABNORMAL LOW (ref 26.0–34.0)
MCHC: 30.4 g/dL (ref 30.0–36.0)
MCV: 75.7 fL — ABNORMAL LOW (ref 78.0–100.0)
Platelets: 184 10*3/uL (ref 150–400)
RBC: 3 MIL/uL — ABNORMAL LOW (ref 4.22–5.81)
RDW: 16.2 % — AB (ref 11.5–15.5)
WBC: 5.1 10*3/uL (ref 4.0–10.5)

## 2013-10-05 LAB — URINALYSIS, ROUTINE W REFLEX MICROSCOPIC
GLUCOSE, UA: NEGATIVE mg/dL
HGB URINE DIPSTICK: NEGATIVE
Ketones, ur: NEGATIVE mg/dL
Leukocytes, UA: NEGATIVE
Nitrite: NEGATIVE
PH: 5.5 (ref 5.0–8.0)
Protein, ur: 30 mg/dL — AB
SPECIFIC GRAVITY, URINE: 1.026 (ref 1.005–1.030)
Urobilinogen, UA: 0.2 mg/dL (ref 0.0–1.0)

## 2013-10-05 LAB — I-STAT TROPONIN, ED: Troponin i, poc: 0.01 ng/mL (ref 0.00–0.08)

## 2013-10-05 LAB — PREPARE RBC (CROSSMATCH)

## 2013-10-05 LAB — CBG MONITORING, ED: GLUCOSE-CAPILLARY: 231 mg/dL — AB (ref 70–99)

## 2013-10-05 LAB — GLUCOSE, CAPILLARY: GLUCOSE-CAPILLARY: 159 mg/dL — AB (ref 70–99)

## 2013-10-05 LAB — POC OCCULT BLOOD, ED: Fecal Occult Bld: NEGATIVE

## 2013-10-05 MED ORDER — LEVOTHYROXINE SODIUM 100 MCG IV SOLR
75.0000 ug | Freq: Every day | INTRAVENOUS | Status: DC
  Administered 2013-10-06 – 2013-10-07 (×2): 75 ug via INTRAVENOUS
  Filled 2013-10-05 (×4): qty 5

## 2013-10-05 MED ORDER — ASPIRIN 81 MG PO CHEW
324.0000 mg | CHEWABLE_TABLET | Freq: Once | ORAL | Status: AC
  Administered 2013-10-05: 324 mg via ORAL
  Filled 2013-10-05: qty 4

## 2013-10-05 MED ORDER — SODIUM CHLORIDE 0.9 % IJ SOLN
3.0000 mL | Freq: Two times a day (BID) | INTRAMUSCULAR | Status: DC
  Administered 2013-10-06 – 2013-10-07 (×2): 3 mL via INTRAVENOUS

## 2013-10-05 MED ORDER — HYDRALAZINE HCL 20 MG/ML IJ SOLN
10.0000 mg | INTRAMUSCULAR | Status: DC | PRN
  Administered 2013-10-06: 10 mg via INTRAVENOUS
  Filled 2013-10-05 (×2): qty 0.5

## 2013-10-05 MED ORDER — SODIUM CHLORIDE 0.9 % IV SOLN
8.0000 mg/h | INTRAVENOUS | Status: DC
  Administered 2013-10-06 – 2013-10-07 (×4): 8 mg/h via INTRAVENOUS
  Filled 2013-10-05 (×10): qty 80

## 2013-10-05 MED ORDER — ACETAMINOPHEN 650 MG RE SUPP: 650.0000 mg | Freq: Four times a day (QID) | RECTAL | Status: DC | PRN

## 2013-10-05 MED ORDER — SODIUM CHLORIDE 0.9 % IV SOLN
INTRAVENOUS | Status: AC
  Administered 2013-10-05 – 2013-10-06 (×2): via INTRAVENOUS

## 2013-10-05 MED ORDER — ACETAMINOPHEN 325 MG PO TABS: 650.0000 mg | ORAL_TABLET | Freq: Four times a day (QID) | ORAL | Status: DC | PRN

## 2013-10-05 MED ORDER — INSULIN ASPART 100 UNIT/ML ~~LOC~~ SOLN
0.0000 [IU] | Freq: Three times a day (TID) | SUBCUTANEOUS | Status: DC
  Administered 2013-10-06: 1 [IU] via SUBCUTANEOUS
  Administered 2013-10-06: 2 [IU] via SUBCUTANEOUS
  Administered 2013-10-06: 1 [IU] via SUBCUTANEOUS
  Administered 2013-10-07: 2 [IU] via SUBCUTANEOUS
  Administered 2013-10-07 (×2): 1 [IU] via SUBCUTANEOUS
  Administered 2013-10-08: 2 [IU] via SUBCUTANEOUS

## 2013-10-05 MED ORDER — ALBUTEROL SULFATE (2.5 MG/3ML) 0.083% IN NEBU: 2.5000 mg | INHALATION_SOLUTION | Freq: Four times a day (QID) | RESPIRATORY_TRACT | Status: DC | PRN

## 2013-10-05 MED ORDER — ALBUTEROL SULFATE HFA 108 (90 BASE) MCG/ACT IN AERS: 2.0000 | INHALATION_SPRAY | Freq: Four times a day (QID) | RESPIRATORY_TRACT | Status: DC | PRN

## 2013-10-05 MED ORDER — PANTOPRAZOLE SODIUM 40 MG IV SOLR
40.0000 mg | Freq: Two times a day (BID) | INTRAVENOUS | Status: DC
  Filled 2013-10-05: qty 40

## 2013-10-05 MED ORDER — SODIUM CHLORIDE 0.9 % IV SOLN
INTRAVENOUS | Status: DC
  Administered 2013-10-05: 23:00:00 via INTRAVENOUS

## 2013-10-05 MED ORDER — PANTOPRAZOLE SODIUM 40 MG IV SOLR
80.0000 mg | Freq: Once | INTRAVENOUS | Status: AC
  Administered 2013-10-06: 80 mg via INTRAVENOUS
  Filled 2013-10-05: qty 80

## 2013-10-05 MED ORDER — ONDANSETRON HCL 4 MG PO TABS: 4.0000 mg | ORAL_TABLET | Freq: Four times a day (QID) | ORAL | Status: DC | PRN

## 2013-10-05 MED ORDER — ONDANSETRON HCL 4 MG/2ML IJ SOLN
4.0000 mg | Freq: Four times a day (QID) | INTRAMUSCULAR | Status: DC | PRN
  Administered 2013-10-07: 4 mg via INTRAVENOUS
  Filled 2013-10-05: qty 2

## 2013-10-05 NOTE — ED Provider Notes (Signed)
Medical screening examination/treatment/procedure(s) were conducted as a shared visit with non-physician practitioner(s) and myself.  I personally evaluated the patient during the encounter.   EKG Interpretation None     Patient presents to the ER for evaluation of shortness of breath and dizziness. Patient found to be anemic. He appeared refilled his records reveals that he recently had upper and lower endoscopy which revealed multiple AVMs and a colonic polyp which was removed on March 20th. Patient appears to have had persistent bleeding and now symptomatic anemia. Will require hospitalization for transfusion and further management.  Orpah Greek, MD 10/05/13 2114

## 2013-10-05 NOTE — ED Notes (Signed)
EKG given to EDP, Pollina, MD., For review.

## 2013-10-05 NOTE — ED Provider Notes (Signed)
CSN: [id number]     Arrival date & time 10/05/13  1914 History   First MD Initiated Contact with Patient 10/05/13 1916     Chief Complaint  Patient presents with  . Dizziness  . Weakness     (Consider location/radiation/quality/duration/timing/severity/associated sxs/prior Treatment) The history is provided by the patient and medical records. No language interpreter was used.    TREXTON STACHOWSKI is a 73 y.o. male  with a hx of HTN,  NIDDM, presents to the Emergency Department complaining of gradual, persistent, progressively worsening weakness with associated lightheadedness and increasing dyspnea on exertion with cough onset yesterday evening and worsening today.  EMS reports that pt CBG elevated onscene and orthostatic on their arrival. Pt was given 650cc enroute and reports he is feeling better now.  Nothing makes his symptoms better.  Pt denies fever, chills, headache, vision changes, neck pain, chest pain, abd pain, vomiting, diarrhea, dizziness, syncope, dysuria, hematuria, hematochezia, melena, black tarry stools, hematemesis.  Pt report the oxygen given by EMS is helping his SOB.   Pt reports he had a colonoscopy 3 weeks ago; unknown if biopsies were taken.   Pt without weight loss, night sweats, dysphagia or constipation.  No hard stools.      Past Medical History  Diagnosis Date  . Hypertension   . Diabetes mellitus without complication   . Hypothyroidism   . Shortness of breath    Past Surgical History  Procedure Laterality Date  . Surgery for bladder cancer  yrs ago  . Esophagogastroduodenoscopy N/A 09/23/2013    Procedure: ESOPHAGOGASTRODUODENOSCOPY (EGD);  Surgeon: Beryle Beams, MD;  Location: [facility];  Service: Endoscopy;  Laterality: N/A;  . Colonoscopy N/A 09/23/2013    Procedure: COLONOSCOPY;  Surgeon: Beryle Beams, MD;  Location: [facility];  Service: Endoscopy;  Laterality: N/A;   No family history on file. History  Substance Use Topics  . Smoking  status: Never Smoker   . Smokeless tobacco: Current User    Types: Chew  . Alcohol Use: No    Review of Systems  Constitutional: Positive for fatigue. Negative for fever, diaphoresis, appetite change and unexpected weight change.  HENT: Negative for mouth sores.   Eyes: Negative for visual disturbance.  Respiratory: Positive for shortness of breath. Negative for cough, chest tightness and wheezing.   Cardiovascular: Negative for chest pain.  Gastrointestinal: Negative for nausea, vomiting, abdominal pain, diarrhea and constipation.  Endocrine: Negative for polydipsia, polyphagia and polyuria.  Genitourinary: Negative for dysuria, urgency, frequency and hematuria.  Musculoskeletal: Negative for back pain and neck stiffness.  Skin: Positive for pallor. Negative for rash.  Allergic/Immunologic: Negative for immunocompromised state.  Neurological: Positive for weakness and light-headedness. Negative for syncope and headaches.  Hematological: Does not bruise/bleed easily.  Psychiatric/Behavioral: Negative for sleep disturbance. The patient is not nervous/anxious.       Allergies  Prednisone  Home Medications   Current Outpatient Rx  Name  Route  Sig  Dispense  Refill  . albuterol (PROVENTIL HFA;VENTOLIN HFA) 108 (90 BASE) MCG/ACT inhaler   Inhalation   Inhale 2 puffs into the lungs every 6 (six) hours as needed for wheezing or shortness of breath.         Marland Kitchen amLODipine (NORVASC) 5 MG tablet   Oral   Take 5 mg by mouth every morning.         Marland Kitchen glipiZIDE (GLUCOTROL) 10 MG tablet   Oral   Take 10 mg by mouth daily before breakfast.         .  HYDROcodone-acetaminophen (NORCO/VICODIN) 5-325 MG per tablet   Oral   Take 1 tablet by mouth every 6 (six) hours as needed for moderate pain.         Marland Kitchen levothyroxine (SYNTHROID, LEVOTHROID) 150 MCG tablet   Oral   Take 150 mcg by mouth daily before breakfast.         . lisinopril (PRINIVIL,ZESTRIL) 20 MG tablet   Oral    Take 20 mg by mouth every morning.         . magnesium oxide (MAG-OX) 400 MG tablet   Oral   Take 400 mg by mouth daily.         . metFORMIN (GLUCOPHAGE-XR) 500 MG 24 hr tablet   Oral   Take 500 mg by mouth 2 (two) times daily.         Marland Kitchen omeprazole (PRILOSEC) 20 MG capsule   Oral   Take 20 mg by mouth daily.          BP 121/48  Pulse 88  Temp(Src) 98 F (36.7 C) (Oral)  Resp 20  SpO2 98% Physical Exam  Nursing note and vitals reviewed. Constitutional: He is oriented to person, place, and time. He appears well-developed and well-nourished. No distress.  Awake, alert, nontoxic appearance  HENT:  Head: Normocephalic and atraumatic.  Mouth/Throat: Oropharynx is clear and moist. No oropharyngeal exudate.  Eyes: Conjunctivae and EOM are normal. Pupils are equal, round, and reactive to light. No scleral icterus.  Pallor of the mucus membranes  Neck: Normal range of motion. Neck supple.  Cardiovascular: Normal rate, regular rhythm, normal heart sounds and intact distal pulses.   No murmur heard. No tachycardia  Pulmonary/Chest: Effort normal and breath sounds normal. No respiratory distress. He has no wheezes. He has no rales.  Clear and equal breath sounds  Abdominal: Soft. Bowel sounds are normal. He exhibits no mass. There is no tenderness. There is no rebound and no guarding.  Musculoskeletal: Normal range of motion. He exhibits no edema.  Lymphadenopathy:    He has no cervical adenopathy.  Neurological: He is alert and oriented to person, place, and time. He has normal reflexes. No cranial nerve deficit. He exhibits normal muscle tone. Coordination normal.  Mental Status:  Alert, oriented, thought content appropriate. Speech fluent without evidence of aphasia. Able to follow 2 step commands without difficulty.  Cranial Nerves:  II:  Peripheral visual fields grossly normal, pupils equal, round, reactive to light III,IV, VI: ptosis not present, extra-ocular motions  intact bilaterally  V,VII: smile symmetric, facial light touch sensation equal VIII: hearing grossly normal bilaterally  IX,X: gag reflex present  XI: bilateral shoulder shrug equal and strong XII: midline tongue extension  Motor:  5/5 in upper and lower extremities bilaterally including strong and equal grip strength and dorsiflexion/plantar flexion Sensory: Pinprick and light touch normal in all extremities.  Deep Tendon Reflexes: 2+ and symmetric  Cerebellar: normal finger-to-nose with bilateral upper extremities Gait: normal gait and balance CV: distal pulses palpable throughout   Skin: Skin is warm and dry. No rash noted. He is not diaphoretic. No erythema. There is pallor.  Psychiatric: He has a normal mood and affect. His behavior is normal. Judgment and thought content normal.    ED Course  Procedures (including critical care time) Labs Review Labs Reviewed  CBC - Abnormal; Notable for the following:    RBC 3.00 (*)    Hemoglobin 6.9 (*)    HCT 22.7 (*)    MCV 75.7 (*)  MCH 23.0 (*)    RDW 16.2 (*)    All other components within normal limits  COMPREHENSIVE METABOLIC PANEL - Abnormal; Notable for the following:    Sodium 132 (*)    Glucose, Bld 244 (*)    BUN 38 (*)    Creatinine, Ser 2.46 (*)    Total Bilirubin <0.2 (*)    GFR calc non Af Amer 25 (*)    GFR calc Af Amer 29 (*)    All other components within normal limits  URINALYSIS, ROUTINE W REFLEX MICROSCOPIC - Abnormal; Notable for the following:    Bilirubin Urine SMALL (*)    Protein, ur 30 (*)    All other components within normal limits  URINE MICROSCOPIC-ADD ON - Abnormal; Notable for the following:    Casts HYALINE CASTS (*)    All other components within normal limits  CBG MONITORING, ED - Abnormal; Notable for the following:    Glucose-Capillary 231 (*)    All other components within normal limits  I-STAT TROPOININ, ED  POC OCCULT BLOOD, ED  PREPARE RBC (CROSSMATCH)  TYPE AND SCREEN    Imaging Review Dg Chest 2 View  10/05/2013   CLINICAL DATA:  Cough.  EXAM: CHEST  2 VIEW  COMPARISON:  DG CHEST PORTABLE dated 06/02/2012  FINDINGS: The lungs are clear without focal infiltrate, edema, pneumothorax or pleural effusion. Interstitial markings are diffusely coarsened with chronic features. Mild hyperexpansion is noted. The cardiopericardial silhouette is within normal limits for size. Imaged bony structures of the thorax are intact.  IMPRESSION: Mild hyperexpansion raises the question of emphysema. Otherwise no acute cardiopulmonary findings.   Electronically Signed   By: Misty Stanley M.D.   On: 10/05/2013 20:35     EKG Interpretation None      ECG:  Date: 10/05/2013  Rate: 84  Rhythm: normal sinus rhythm  QRS Axis: normal  Intervals: normal  ST/T Wave abnormalities: normal  Conduction Disutrbances:none  Narrative Interpretation: nonischemic ECG  Old EKG Reviewed: none available    MDM   Final diagnoses:  GI bleed  Symptomatic anemia   Thane Edu presents with generalized weakness with associated lightheadedness for 24 hours.  On exam patient appears pale but is alert and oriented.  Normal neurologic exam without focal neurologic deficit. Patient denies chest pain but does admit dyspnea on exertion.  Concern for possible anemia versus NSTEMI.  Doubt CVA at this time.    Pt with recent colonoscopy by Dr Benson Norway on 09/23/13.  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  Biopsy taken in the colon.  No other findings noted.   8:18 PM CBC with hemoglobin of 6.9 creaking asymptomatic anemia.  CMP with evidence of acute renal failure with a BUN of 30 and creatinine of 2.46.  Mild hyperglycemia at 244; AG is 12.  Will proceed with admission.  UA pending, but pt denies urinary symptoms.    9:03 PM Pt is admitted for acute renal failure and symptomatic anemia.    Jarrett Soho Braelyn Jenson, PA-C 10/05/13 2106

## 2013-10-05 NOTE — ED Notes (Signed)
Per EMS: Pt diabetic, reported "feeling funny." Pt appeared paler than usual. BP 110/52. Standing up, 84/44. Pt denies pain. SOB with exertion. CBG 534. Pt reports checked CBG this morning was 184, pt took metformin. Pt states sx started yesterday with multiple dizzy spells. Pt given 619mL of fluid, reports some new nausea upon arrival to ED.

## 2013-10-05 NOTE — H&P (Signed)
[facility] History and Physical  Harold Martin [id number] DOB: Nov 27, 1940 DOA: 10/05/2013  Referring physician: ER physician. PCP: Dwan Bolt, MD   Chief Complaint: Shortness of breath and dizziness.  HPI: Harold Martin is a 73 y.o. male with history of hypertension, diabetes mellitus and hypothyroidism started experiencing weakness dizziness and shortness of breath over the last couple of weeks. Denies any chest pain or loss of consciousness or any focal deficits. In the ER patient was noticed to have hemoglobin of 6.9 had dropped from 9.8 few days ago. Patient had EGD and colonoscopy done on March 20 by Dr. Benson Norway. EGD and colonoscopy showed AVMs and sigmoid polyp. Patient did not notice any blood in the stools or black stools. Patient does use Aleve routinely for pain but EGD has not shown any ulcers. Patient is hemodynamically stable and has been admitted for further management. Patient denies any nausea vomiting or diarrhea has mild epigastric discomfort.   Review of Systems: As presented in the history of presenting illness, rest negative.  Past Medical History  Diagnosis Date  . Hypertension   . Diabetes mellitus without complication   . Hypothyroidism   . Shortness of breath    Past Surgical History  Procedure Laterality Date  . Surgery for bladder cancer  yrs ago  . Esophagogastroduodenoscopy N/A 09/23/2013    Procedure: ESOPHAGOGASTRODUODENOSCOPY (EGD);  Surgeon: Beryle Beams, MD;  Location: [facility];  Service: Endoscopy;  Laterality: N/A;  . Colonoscopy N/A 09/23/2013    Procedure: COLONOSCOPY;  Surgeon: Beryle Beams, MD;  Location: [facility];  Service: Endoscopy;  Laterality: N/A;   Social History:  reports that he has never smoked. His smokeless tobacco use includes Chew. He reports that he does not drink alcohol or use illicit drugs. Where does patient live home. Can patient participate in ADLs? Yes.  Allergies  Allergen Reactions  .  Prednisone     REACTION: Reaction not known    Family History: History reviewed. No pertinent family history.    Prior to Admission medications   Medication Sig Start Date End Date Taking? Authorizing Provider  albuterol (PROVENTIL HFA;VENTOLIN HFA) 108 (90 BASE) MCG/ACT inhaler Inhale 2 puffs into the lungs every 6 (six) hours as needed for wheezing or shortness of breath.   Yes Historical Provider, MD  amLODipine (NORVASC) 5 MG tablet Take 5 mg by mouth every morning.   Yes Historical Provider, MD  glipiZIDE (GLUCOTROL) 10 MG tablet Take 10 mg by mouth daily before breakfast.   Yes Historical Provider, MD  HYDROcodone-acetaminophen (NORCO/VICODIN) 5-325 MG per tablet Take 1 tablet by mouth every 6 (six) hours as needed for moderate pain.   Yes Historical Provider, MD  levothyroxine (SYNTHROID, LEVOTHROID) 150 MCG tablet Take 150 mcg by mouth daily before breakfast.   Yes Historical Provider, MD  lisinopril (PRINIVIL,ZESTRIL) 20 MG tablet Take 20 mg by mouth every morning.   Yes Historical Provider, MD  magnesium oxide (MAG-OX) 400 MG tablet Take 400 mg by mouth daily.   Yes Historical Provider, MD  metFORMIN (GLUCOPHAGE-XR) 500 MG 24 hr tablet Take 500 mg by mouth 2 (two) times daily.   Yes Historical Provider, MD  omeprazole (PRILOSEC) 20 MG capsule Take 20 mg by mouth daily.   Yes Historical Provider, MD    Physical Exam: Filed Vitals:   10/05/13 1915 10/05/13 1921  BP: 121/48   Pulse: 88   Temp: 98 F (36.7 C)   TempSrc: Oral   Resp: 20   SpO2:  94% 98%     General:  Well-developed and nourished.  Eyes: Anicteric no pallor.  ENT: No discharge from the ears eyes nose mouth.  Neck: No mass felt.  Cardiovascular: S1-S2 heard.Marland Kitchen  Respiratory: No rhonchi or crepitations.   Abdomen: Soft nontender bowel sounds present. No guarding or rigidity.   Skin: No rash.   Musculoskeletal: No edema.   Psychiatric: Appears normal.   Neurologic: Alert awake oriented to time place  and person. Moves all extremities.  Labs on Admission:  Basic Metabolic Panel:  Recent Labs Lab 10/05/13 1935  NA 132*  K 5.1  CL 100  CO2 20  GLUCOSE 244*  BUN 38*  CREATININE 2.46*  CALCIUM 8.4   Liver Function Tests:  Recent Labs Lab 10/05/13 1935  AST 16  ALT 13  ALKPHOS 79  BILITOT <0.2*  PROT 6.2  ALBUMIN 3.6   No results found for this basename: LIPASE, AMYLASE,  in the last 168 hours No results found for this basename: AMMONIA,  in the last 168 hours CBC:  Recent Labs Lab 10/05/13 1935  WBC 5.1  HGB 6.9*  HCT 22.7*  MCV 75.7*  PLT 184   Cardiac Enzymes: No results found for this basename: CKTOTAL, CKMB, CKMBINDEX, TROPONINI,  in the last 168 hours  BNP (last 3 results) No results found for this basename: PROBNP,  in the last 8760 hours CBG:  Recent Labs Lab 10/05/13 1946  GLUCAP 231*    Radiological Exams on Admission: Dg Chest 2 View  10/05/2013   CLINICAL DATA:  Cough.  EXAM: CHEST  2 VIEW  COMPARISON:  DG CHEST PORTABLE dated 06/02/2012  FINDINGS: The lungs are clear without focal infiltrate, edema, pneumothorax or pleural effusion. Interstitial markings are diffusely coarsened with chronic features. Mild hyperexpansion is noted. The cardiopericardial silhouette is within normal limits for size. Imaged bony structures of the thorax are intact.  IMPRESSION: Mild hyperexpansion raises the question of emphysema. Otherwise no acute cardiopulmonary findings.   Electronically Signed   By: Misty Stanley M.D.   On: 10/05/2013 20:35    EKG: Independently reviewed. Normal sinus rhythm.  Assessment/Plan Principal Problem:   GI bleed Active Problems:   DIABETES, TYPE 2   HYPERLIPIDEMIA   HYPERTENSION   1. Symptomatic severe anemia - suspect possibly GI bleed for patient's severe anemia. Patient does have macrocytic hyperchromic anemia with recent EGD and, showing AVMs and also had a polypectomy. Patient has not noticed obvious GI bleed. At this  time as patient is symptomatic we are transfusing 2 units of packed red blood cells. Recheck CBC in a.m. and also we'll order LDH to rule out any hemolytic process which is less likely as patient is having normal bilirubin. I will keep patient in anticipation of possible GI procedure. Patient has been placed on Protonix infusion. I have consulted Dr. Collene Mares, gastroenterologist, colleague of Dr. Benson Norway who will be seeing patient in consult. 2. Acute renal failure - UA is pending. Probably related to anemia. Since patient has diabetes and hypertension as a possible the patient may be also having a chronic component. Closely follow intake output. 3. Uncontrolled diabetes - for now I am placing patient on sliding-scale coverage. Closely follow CBGs. 4. Non-anion gap metabolic acidosis - probably from #1 and 2. 5. Hypertension - since patient is n.p.o. I have placed patient on when necessary IV hydralazine. 6. Hypothyroidism - since patient is n.p.o. IV Synthroid.  Patient is presently hemodynamically stable. I have discussed with on-call gastroenterologist Dr.  Mann.  Code Status: Full code.  Family Communication: Patient's daughter.  Disposition Plan: Admit to inpatient.    Haya Hemler N. [facility] Pager [phone].  If 7PM-7AM, please contact night-coverage [email or web address] Password [facility] 10/05/2013, 9:38 PM

## 2013-10-05 NOTE — ED Notes (Signed)
Pt. IV site infiltrated. Per CN,Tiffany, IV removed.

## 2013-10-05 NOTE — ED Notes (Signed)
Pt. Daughter Alyse Low) left number [phone] if needed.

## 2013-10-05 NOTE — ED Notes (Signed)
Bed: WA04 Expected date:  Expected time:  Means of arrival:  Comments: ems- elderly orthostatic hypotension

## 2013-10-06 ENCOUNTER — Inpatient Hospital Stay (HOSPITAL_COMMUNITY): Payer: Medicare Other

## 2013-10-06 DIAGNOSIS — N189 Chronic kidney disease, unspecified: Secondary | ICD-10-CM

## 2013-10-06 DIAGNOSIS — E871 Hypo-osmolality and hyponatremia: Secondary | ICD-10-CM | POA: Diagnosis present

## 2013-10-06 DIAGNOSIS — N179 Acute kidney failure, unspecified: Secondary | ICD-10-CM | POA: Diagnosis present

## 2013-10-06 LAB — CBC WITH DIFFERENTIAL/PLATELET
BASOS PCT: 1 % (ref 0–1)
Basophils Absolute: 0 10*3/uL (ref 0.0–0.1)
Eosinophils Absolute: 0.4 10*3/uL (ref 0.0–0.7)
Eosinophils Relative: 7 % — ABNORMAL HIGH (ref 0–5)
HCT: 30.6 % — ABNORMAL LOW (ref 39.0–52.0)
HEMOGLOBIN: 9.6 g/dL — AB (ref 13.0–17.0)
LYMPHS PCT: 14 % (ref 12–46)
Lymphs Abs: 0.8 10*3/uL (ref 0.7–4.0)
MCH: 24.4 pg — ABNORMAL LOW (ref 26.0–34.0)
MCHC: 31.4 g/dL (ref 30.0–36.0)
MCV: 77.9 fL — ABNORMAL LOW (ref 78.0–100.0)
MONO ABS: 0.5 10*3/uL (ref 0.1–1.0)
MONOS PCT: 9 % (ref 3–12)
NEUTROS ABS: 4 10*3/uL (ref 1.7–7.7)
NEUTROS PCT: 70 % (ref 43–77)
Platelets: 181 10*3/uL (ref 150–400)
RBC: 3.93 MIL/uL — ABNORMAL LOW (ref 4.22–5.81)
RDW: 16.6 % — ABNORMAL HIGH (ref 11.5–15.5)
WBC: 5.6 10*3/uL (ref 4.0–10.5)

## 2013-10-06 LAB — GLUCOSE, CAPILLARY
GLUCOSE-CAPILLARY: 118 mg/dL — AB (ref 70–99)
GLUCOSE-CAPILLARY: 143 mg/dL — AB (ref 70–99)
Glucose-Capillary: 145 mg/dL — ABNORMAL HIGH (ref 70–99)
Glucose-Capillary: 158 mg/dL — ABNORMAL HIGH (ref 70–99)

## 2013-10-06 LAB — COMPREHENSIVE METABOLIC PANEL
ALBUMIN: 3.7 g/dL (ref 3.5–5.2)
ALK PHOS: 80 U/L (ref 39–117)
ALT: 13 U/L (ref 0–53)
AST: 18 U/L (ref 0–37)
BILIRUBIN TOTAL: 0.2 mg/dL — AB (ref 0.3–1.2)
BUN: 30 mg/dL — ABNORMAL HIGH (ref 6–23)
CHLORIDE: 105 meq/L (ref 96–112)
CO2: 21 meq/L (ref 19–32)
CREATININE: 1.86 mg/dL — AB (ref 0.50–1.35)
Calcium: 8.7 mg/dL (ref 8.4–10.5)
GFR calc Af Amer: 41 mL/min — ABNORMAL LOW (ref >90)
GFR, EST NON AFRICAN AMERICAN: 35 mL/min — AB (ref >90)
Glucose, Bld: 163 mg/dL — ABNORMAL HIGH (ref 70–99)
POTASSIUM: 5.5 meq/L — AB (ref 3.7–5.3)
Sodium: 137 mEq/L (ref 137–147)
Total Protein: 6.6 g/dL (ref 6.0–8.3)

## 2013-10-06 LAB — RETICULOCYTES
RBC.: 3.98 MIL/uL — AB (ref 4.22–5.81)
Retic Count, Absolute: 35.8 10*3/uL (ref 19.0–186.0)
Retic Ct Pct: 0.9 % (ref 0.4–3.1)

## 2013-10-06 LAB — VITAMIN B12: VITAMIN B 12: 594 pg/mL (ref 211–911)

## 2013-10-06 LAB — IRON AND TIBC
Iron: 53 ug/dL (ref 42–135)
Saturation Ratios: 12 % — ABNORMAL LOW (ref 20–55)
TIBC: 429 ug/dL (ref 215–435)
UIBC: 376 ug/dL (ref 125–400)

## 2013-10-06 LAB — TSH: TSH: 2.28 u[IU]/mL (ref 0.350–4.500)

## 2013-10-06 LAB — TROPONIN I: Troponin I: <0.3 ng/mL (ref <0.30)

## 2013-10-06 LAB — CREATININE, URINE, RANDOM: Creatinine, Urine: 65.4 mg/dL

## 2013-10-06 LAB — HEMOGLOBIN AND HEMATOCRIT, BLOOD
HCT: 31.8 % — ABNORMAL LOW (ref 39.0–52.0)
Hemoglobin: 9.8 g/dL — ABNORMAL LOW (ref 13.0–17.0)

## 2013-10-06 LAB — SODIUM, URINE, RANDOM: Sodium, Ur: 110 mEq/L

## 2013-10-06 LAB — FOLATE: FOLATE: 15.4 ng/mL

## 2013-10-06 LAB — PROTIME-INR
INR: 0.93 (ref 0.00–1.49)
Prothrombin Time: 12.3 seconds (ref 11.6–15.2)

## 2013-10-06 LAB — LACTATE DEHYDROGENASE: LDH: 133 U/L (ref 94–250)

## 2013-10-06 LAB — ABO/RH: ABO/RH(D): O POS

## 2013-10-06 LAB — FERRITIN: Ferritin: 3 ng/mL — ABNORMAL LOW (ref 22–322)

## 2013-10-06 LAB — HEMOGLOBIN A1C
HEMOGLOBIN A1C: 7 % — AB (ref <5.7)
MEAN PLASMA GLUCOSE: 154 mg/dL — AB (ref <117)

## 2013-10-06 MED ORDER — AMLODIPINE BESYLATE 5 MG PO TABS
5.0000 mg | ORAL_TABLET | Freq: Every day | ORAL | Status: DC
  Administered 2013-10-06 – 2013-10-08 (×3): 5 mg via ORAL
  Filled 2013-10-06 (×3): qty 1

## 2013-10-06 MED ORDER — SODIUM POLYSTYRENE SULFONATE 15 GM/60ML PO SUSP
15.0000 g | Freq: Once | ORAL | Status: AC
  Administered 2013-10-06: 15 g via ORAL
  Filled 2013-10-06: qty 60

## 2013-10-06 MED ORDER — HALOPERIDOL LACTATE 5 MG/ML IJ SOLN
2.0000 mg | Freq: Four times a day (QID) | INTRAMUSCULAR | Status: DC | PRN
  Administered 2013-10-06 – 2013-10-07 (×3): 2 mg via INTRAVENOUS
  Filled 2013-10-06 (×4): qty 1

## 2013-10-06 NOTE — Consult Note (Signed)
Reason for Consult: Anemia Referring Physician: [facility]  Harold Martin HPI: This is a 73 year old male who is known to me for his recent work up for [facility].  He underwent an EGD/Colonoscopy on 09/23/2013 with evidence of a small nonbleeding antral AVM and a moderately-sized faint ascending colon AVM.  Both were ablated with APC.  He is admitted at this time for dizziness and SOB.  A repeat HGB revealed that his HGB dropped down to 6.9 from 9.8.  No reports of hematochezia or melena and he is heme negative.  Past Medical History  Diagnosis Date  . Hypertension   . Diabetes mellitus without complication   . Hypothyroidism   . Shortness of breath     Past Surgical History  Procedure Laterality Date  . Surgery for bladder cancer  yrs ago  . Esophagogastroduodenoscopy N/A 09/23/2013    Procedure: ESOPHAGOGASTRODUODENOSCOPY (EGD);  Surgeon: Beryle Beams, MD;  Location: [facility];  Service: Endoscopy;  Laterality: N/A;  . Colonoscopy N/A 09/23/2013    Procedure: COLONOSCOPY;  Surgeon: Beryle Beams, MD;  Location: [facility];  Service: Endoscopy;  Laterality: N/A;    History reviewed. No pertinent family history.  Social History:  reports that he has never smoked. His smokeless tobacco use includes Chew. He reports that he does not drink alcohol or use illicit drugs.  Allergies:  Allergies  Allergen Reactions  . Prednisone     REACTION: Reaction not known    Medications:  Scheduled: . amLODipine  5 mg Oral Daily  . insulin aspart  0-9 Units Subcutaneous TID WC  . levothyroxine  75 mcg Intravenous QAC breakfast  . [START ON 10/09/2013] pantoprazole (PROTONIX) IV  40 mg Intravenous Q12H  . sodium chloride  3 mL Intravenous Q12H  . sodium polystyrene  15 g Oral Once   Continuous: . sodium chloride 75 mL/hr at 10/06/13 1212  . pantoprozole (PROTONIX) infusion 8 mg/hr (10/06/13 1306)    Results for orders placed during the hospital encounter of 10/05/13 (from the past  24 hour(s))  CBC     Status: Abnormal   Collection Time    10/05/13  7:35 PM      Result Value Ref Range   WBC 5.1  4.0 - 10.5 K/uL   RBC 3.00 (*) 4.22 - 5.81 MIL/uL   Hemoglobin 6.9 (*) 13.0 - 17.0 g/dL   HCT 22.7 (*) 39.0 - 52.0 %   MCV 75.7 (*) 78.0 - 100.0 fL   MCH 23.0 (*) 26.0 - 34.0 pg   MCHC 30.4  30.0 - 36.0 g/dL   RDW 16.2 (*) 11.5 - 15.5 %   Platelets 184  150 - 400 K/uL  COMPREHENSIVE METABOLIC PANEL     Status: Abnormal   Collection Time    10/05/13  7:35 PM      Result Value Ref Range   Sodium 132 (*) 137 - 147 mEq/L   Potassium 5.1  3.7 - 5.3 mEq/L   Chloride 100  96 - 112 mEq/L   CO2 20  19 - 32 mEq/L   Glucose, Bld 244 (*) 70 - 99 mg/dL   BUN 38 (*) 6 - 23 mg/dL   Creatinine, Ser 2.46 (*) 0.50 - 1.35 mg/dL   Calcium 8.4  8.4 - 10.5 mg/dL   Total Protein 6.2  6.0 - 8.3 g/dL   Albumin 3.6  3.5 - 5.2 g/dL   AST 16  0 - 37 U/L   ALT 13  0 - 53 U/L   Alkaline Phosphatase 79  39 - 117 U/L   Total Bilirubin <0.2 (*) 0.3 - 1.2 mg/dL   GFR calc non Af Amer 25 (*) >90 mL/min   GFR calc Af Amer 29 (*) >90 mL/min  I-STAT TROPOININ, ED     Status: None   Collection Time    10/05/13  7:43 PM      Result Value Ref Range   Troponin i, poc 0.01  0.00 - 0.08 ng/mL   Comment 3           CBG MONITORING, ED     Status: Abnormal   Collection Time    10/05/13  7:46 PM      Result Value Ref Range   Glucose-Capillary 231 (*) 70 - 99 mg/dL   Comment 1 Notify RN    POC OCCULT BLOOD, ED     Status: None   Collection Time    10/05/13  8:05 PM      Result Value Ref Range   Fecal Occult Bld NEGATIVE  NEGATIVE  URINALYSIS, ROUTINE W REFLEX MICROSCOPIC     Status: Abnormal   Collection Time    10/05/13  8:09 PM      Result Value Ref Range   Color, Urine YELLOW  YELLOW   APPearance CLEAR  CLEAR   Specific Gravity, Urine 1.026  1.005 - 1.030   pH 5.5  5.0 - 8.0   Glucose, UA NEGATIVE  NEGATIVE mg/dL   Hgb urine dipstick NEGATIVE  NEGATIVE   Bilirubin Urine SMALL (*)  NEGATIVE   Ketones, ur NEGATIVE  NEGATIVE mg/dL   Protein, ur 30 (*) NEGATIVE mg/dL   Urobilinogen, UA 0.2  0.0 - 1.0 mg/dL   Nitrite NEGATIVE  NEGATIVE   Leukocytes, UA NEGATIVE  NEGATIVE  URINE MICROSCOPIC-ADD ON     Status: Abnormal   Collection Time    10/05/13  8:09 PM      Result Value Ref Range   Squamous Epithelial / LPF RARE  RARE   WBC, UA 0-2  <3 WBC/hpf   Casts HYALINE CASTS (*) NEGATIVE  TYPE AND SCREEN     Status: None   Collection Time    10/05/13  8:20 PM      Result Value Ref Range   ABO/RH(D) O POS     Antibody Screen NEG     Sample Expiration 10/08/2013     Unit Number [id number]     Blood Component Type RED CELLS,LR     Unit division 00     Status of Unit ISSUED     Transfusion Status OK TO TRANSFUSE     Crossmatch Result Compatible     Unit Number [id number]     Blood Component Type RED CELLS,LR     Unit division 00     Status of Unit ISSUED     Transfusion Status OK TO TRANSFUSE     Crossmatch Result Compatible    ABO/RH     Status: None   Collection Time    10/05/13  8:20 PM      Result Value Ref Range   ABO/RH(D) O POS    PREPARE RBC (CROSSMATCH)     Status: None   Collection Time    10/05/13  8:30 PM      Result Value Ref Range   Order Confirmation ORDER PROCESSED BY BLOOD BANK    GLUCOSE, CAPILLARY     Status: Abnormal   Collection Time  10/05/13 11:15 PM      Result Value Ref Range   Glucose-Capillary 159 (*) 70 - 99 mg/dL   Comment 1 Documented in Chart     Comment 2 Notify RN    GLUCOSE, CAPILLARY     Status: Abnormal   Collection Time    10/06/13  4:01 AM      Result Value Ref Range   Glucose-Capillary 118 (*) 70 - 99 mg/dL   Comment 1 Documented in Chart     Comment 2 Notify RN    GLUCOSE, CAPILLARY     Status: Abnormal   Collection Time    10/06/13  7:54 AM      Result Value Ref Range   Glucose-Capillary 143 (*) 70 - 99 mg/dL  CBC WITH DIFFERENTIAL     Status: Abnormal   Collection Time    10/06/13  9:10 AM       Result Value Ref Range   WBC 5.6  4.0 - 10.5 K/uL   RBC 3.93 (*) 4.22 - 5.81 MIL/uL   Hemoglobin 9.6 (*) 13.0 - 17.0 g/dL   HCT 30.6 (*) 39.0 - 52.0 %   MCV 77.9 (*) 78.0 - 100.0 fL   MCH 24.4 (*) 26.0 - 34.0 pg   MCHC 31.4  30.0 - 36.0 g/dL   RDW 16.6 (*) 11.5 - 15.5 %   Platelets 181  150 - 400 K/uL   Neutrophils Relative % 70  43 - 77 %   Neutro Abs 4.0  1.7 - 7.7 K/uL   Lymphocytes Relative 14  12 - 46 %   Lymphs Abs 0.8  0.7 - 4.0 K/uL   Monocytes Relative 9  3 - 12 %   Monocytes Absolute 0.5  0.1 - 1.0 K/uL   Eosinophils Relative 7 (*) 0 - 5 %   Eosinophils Absolute 0.4  0.0 - 0.7 K/uL   Basophils Relative 1  0 - 1 %   Basophils Absolute 0.0  0.0 - 0.1 K/uL  COMPREHENSIVE METABOLIC PANEL     Status: Abnormal   Collection Time    10/06/13  9:10 AM      Result Value Ref Range   Sodium 137  137 - 147 mEq/L   Potassium 5.5 (*) 3.7 - 5.3 mEq/L   Chloride 105  96 - 112 mEq/L   CO2 21  19 - 32 mEq/L   Glucose, Bld 163 (*) 70 - 99 mg/dL   BUN 30 (*) 6 - 23 mg/dL   Creatinine, Ser 1.86 (*) 0.50 - 1.35 mg/dL   Calcium 8.7  8.4 - 10.5 mg/dL   Total Protein 6.6  6.0 - 8.3 g/dL   Albumin 3.7  3.5 - 5.2 g/dL   AST 18  0 - 37 U/L   ALT 13  0 - 53 U/L   Alkaline Phosphatase 80  39 - 117 U/L   Total Bilirubin 0.2 (*) 0.3 - 1.2 mg/dL   GFR calc non Af Amer 35 (*) >90 mL/min   GFR calc Af Amer 41 (*) >90 mL/min  LACTATE DEHYDROGENASE     Status: None   Collection Time    10/06/13  9:10 AM      Result Value Ref Range   LDH 133  94 - 250 U/L  PROTIME-INR     Status: None   Collection Time    10/06/13  9:10 AM      Result Value Ref Range   Prothrombin Time 12.3  11.6 -  15.2 seconds   INR 0.93  0.00 - 1.49  TROPONIN I     Status: None   Collection Time    10/06/13  9:10 AM      Result Value Ref Range   Troponin I <0.30  <0.30 ng/mL  RETICULOCYTES     Status: Abnormal   Collection Time    10/06/13 11:15 AM      Result Value Ref Range   Retic Ct Pct 0.9  0.4 - 3.1 %   RBC.  3.98 (*) 4.22 - 5.81 MIL/uL   Retic Count, Manual 35.8  19.0 - 186.0 K/uL  GLUCOSE, CAPILLARY     Status: Abnormal   Collection Time    10/06/13 11:50 AM      Result Value Ref Range   Glucose-Capillary 145 (*) 70 - 99 mg/dL     Dg Chest 2 View  10/05/2013   CLINICAL DATA:  Cough.  EXAM: CHEST  2 VIEW  COMPARISON:  DG CHEST PORTABLE dated 06/02/2012  FINDINGS: The lungs are clear without focal infiltrate, edema, pneumothorax or pleural effusion. Interstitial markings are diffusely coarsened with chronic features. Mild hyperexpansion is noted. The cardiopericardial silhouette is within normal limits for size. Imaged bony structures of the thorax are intact.  IMPRESSION: Mild hyperexpansion raises the question of emphysema. Otherwise no acute cardiopulmonary findings.   Electronically Signed   By: Misty Stanley M.D.   On: 10/05/2013 20:35   US Renal  10/06/2013   CLINICAL DATA:  Acute on chronic renal failure. History of hypertension, diabetes, bladder cancer.  EXAM: RENAL/URINARY TRACT ULTRASOUND COMPLETE  COMPARISON:  CT of the chest on 06/01/2012  FINDINGS: Right Kidney:  Length: 12.1 cm. Echogenicity within normal limits. No mass or hydronephrosis visualized.  Left Kidney:  Length: 12.1 cm. Echogenicity within normal limits. No mass or hydronephrosis visualized.  Bladder:  Appears normal for degree of bladder distention.  IMPRESSION: Normal renal ultrasound.   Electronically Signed   By: Shon Hale M.D.   On: 10/06/2013 12:09    ROS:  As stated above in the HPI otherwise negative.  Blood pressure 176/72, pulse 84, temperature 97.4 F (36.3 C), temperature source Oral, resp. rate 15, height 6' (1.829 m), weight 240 lb (108.863 kg), SpO2 100.00%.    PE: Gen: NAD, Alert and Oriented HEENT:  [place]/AT, EOMI Neck: Supple, no LAD Lungs: CTA Bilaterally CV: RRR without M/G/R ABM: Soft, NTND, +BS Ext: No C/C/E  Assessment/Plan: 1) Progressive anemia. 2) History of AVMs.   With his history of  AVMs I will pursue a capsule endoscopy.  I was expecting his hemoccult to be positive with his almost 3 gram drop in HGB.  Plan: 1) Capsule endoscopy.  Helen Cuff D 10/06/2013, 1:26 PM

## 2013-10-06 NOTE — Progress Notes (Signed)
[facility] PROGRESS NOTE  Harold Martin [id number] DOB: Nov 06, 1940 DOA: 10/05/2013 PCP: Dwan Bolt, MD Brief HPI: Harold Martin is a 73 y.o. male with history of hypertension, diabetes mellitus and hypothyroidism started experiencing weakness dizziness and shortness of breath over the last couple of weeks. In the ER patient was noticed to have hemoglobin of 6.9 had dropped from 9.8 few days ago. Patient had EGD and colonoscopy done on March 20 by Dr. Benson Norway. EGD and colonoscopy showed AVMs and sigmoid polyp. Patient did not notice any blood in the stools or black stools. Patient does use Aleve routinely for pain but EGD has not shown any ulcers. Patient is hemodynamically stable and has been admitted for further management. GI consulted and currently getting blood transfusions 2 units ordered last night. He has normal bilirubin adn LDH is pending. His stool for occult blood is negative. We will get anemia panel.    Assessment/Plan: Symptomatic anemia leading to sob and dizziness: - admitted to telemetry. Probably from the anemia.  - getting 2 units of prbc transfusion.  - gi consulted for further recommendations.  - stool occult blood negative.  - LDH and anemia panel is pending.  - ON IV PPI.  - NPO    2. Hypothyroidism: - TSH is pending.  - on synthroid.   3. Diabetes Mellitus: - hgba1c is pending.  - SSI.   4. Hypertension: - suboptimal  - on prn hydralazine.   5. 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  - getting US RENAL, urine electrolytes, and UA negative for infection. Protein is 30 .  - hydration and repeat BMP in am.   6. Hyponatremia:  - once again probably from dehydration.  - getting NS fluids.  - repeat in am.      DVT prophylaxis.   Code Status: full code Family Communication: none at bedside Disposition Plan: pending   Consultants:  GI  Procedures:  US RENAL pending.    Antibiotics:  NONE  HPI/Subjective: Comfortable.   Objective: Filed Vitals:   10/06/13 0652  BP: 173/85  Pulse: 77  Temp: 97.4 F (36.3 C)  Resp: 15    Intake/Output Summary (Last 24 hours) at 10/06/13 0913 Last data filed at 10/06/13 0800  Gross per 24 hour  Intake 781.25 ml  Output    650 ml  Net 131.25 ml   Filed Weights   10/05/13 2240  Weight: 108.863 kg (240 lb)    Exam:   General:  Alert afebrile comfortable  Cardiovascular: s1s2  Respiratory: ctab   Abdomen: soft NT ND BS+  Musculoskeletal: no pedal edema.   Data Reviewed: Basic Metabolic Panel:  Recent Labs Lab 10/05/13 1935  NA 132*  K 5.1  CL 100  CO2 20  GLUCOSE 244*  BUN 38*  CREATININE 2.46*  CALCIUM 8.4   Liver Function Tests:  Recent Labs Lab 10/05/13 1935  AST 16  ALT 13  ALKPHOS 79  BILITOT <0.2*  PROT 6.2  ALBUMIN 3.6   No results found for this basename: LIPASE, AMYLASE,  in the last 168 hours No results found for this basename: AMMONIA,  in the last 168 hours CBC:  Recent Labs Lab 10/05/13 1935  WBC 5.1  HGB 6.9*  HCT 22.7*  MCV 75.7*  PLT 184   Cardiac Enzymes: No results found for this basename: CKTOTAL, CKMB, CKMBINDEX, TROPONINI,  in the last 168 hours BNP (last 3 results) No results found for this basename: PROBNP,  in the last 8760 hours CBG:  Recent Labs Lab 10/05/13 1946 10/05/13 2315 10/06/13 0401 10/06/13 0754  GLUCAP 231* 159* 118* 143*    No results found for this or any previous visit (from the past 240 hour(s)).   Studies: Dg Chest 2 View  10/05/2013   CLINICAL DATA:  Cough.  EXAM: CHEST  2 VIEW  COMPARISON:  DG CHEST PORTABLE dated 06/02/2012  FINDINGS: The lungs are clear without focal infiltrate, edema, pneumothorax or pleural effusion. Interstitial markings are diffusely coarsened with chronic features. Mild hyperexpansion is noted. The cardiopericardial silhouette is within normal limits for size. Imaged bony structures of  the thorax are intact.  IMPRESSION: Mild hyperexpansion raises the question of emphysema. Otherwise no acute cardiopulmonary findings.   Electronically Signed   By: Misty Stanley M.D.   On: 10/05/2013 20:35    Scheduled Meds: . insulin aspart  0-9 Units Subcutaneous TID WC  . levothyroxine  75 mcg Intravenous QAC breakfast  . [START ON 10/09/2013] pantoprazole (PROTONIX) IV  40 mg Intravenous Q12H  . sodium chloride  3 mL Intravenous Q12H   Continuous Infusions: . sodium chloride 75 mL/hr at 10/05/13 2300  . pantoprozole (PROTONIX) infusion 8 mg/hr (10/06/13 0041)    Principal Problem:   GI bleed Active Problems:   DIABETES, TYPE 2   HYPERLIPIDEMIA   HYPERTENSION    Time spent: 25 min    [facility] Pager [phone] If 7PM-7AM, please contact night-coverage at [email or web address], password [facility] 10/06/2013, 9:13 AM  LOS: 1 day

## 2013-10-06 NOTE — Evaluation (Signed)
I have reviewed this note and agree with all findings. Kati Berlynn Warsame, PT, DPT Pager: [phone]   

## 2013-10-06 NOTE — Evaluation (Signed)
Physical Therapy Evaluation Patient Details Name: Harold Martin MRN: [id number] DOB: Aug 11, 1940 Today's Date: 10/06/2013   History of Present Illness  Pt is a 72 year old with a GI bleed presenting with dizziness, SOB, and anemia; PMH of hypertension, diabetes mellitus and hypothyroidism   Clinical Impression  Pt admitted with a GI bleed with dizziness, SOB, and anemia.  Pt currently with functional limitations due to the deficits listed below (see PT Problem List).  No longer dizzy with change in positions, anemia is not symptomatic.  Pt initially unsteady with ambulation in hallway however, responded to perturbations with ankle strategy and no LOB.  Needs further assessment of balance, however pt was fatiguing at end of evaluation.  Pt will benefit from skilled PT to increase their independence and safety with mobility to allow discharge.    Follow Up Recommendations No PT follow up    Equipment Recommendations  None recommended by PT    Recommendations for Other Services       Precautions / Restrictions Precautions Precautions: Fall Precaution Comments: anemia not currently symptomatic following administration of blood, however pt had previously experienced dizziness      Mobility  Bed Mobility Overal bed mobility: Needs Assistance Bed Mobility: Supine to Sit     Supine to sit: Min guard     General bed mobility comments: verbal cues for safety  Transfers Overall transfer level: Needs assistance Equipment used: Rolling walker (2 wheeled) Transfers: Sit to/from Stand Sit to Stand: Min guard         General transfer comment: verbal cues for safety; pt did not experience dizziness upon standing  Ambulation/Gait Ambulation/Gait assistance: Min guard Ambulation Distance (Feet): 200 Feet Assistive device: Rolling walker (2 wheeled) Gait Pattern/deviations: WFL(Within Functional Limits)     General Gait Details: verbal cues for safety and use of rolling walker;  initially unsteady, cued to slow gait speed twice as pt was walking very quickly  Stairs            Wheelchair Mobility    Modified Rankin (Stroke Patients Only)       Balance Overall balance assessment: Independent           Standing balance-Leahy Scale: Normal Standing balance comment: pt responded to multi-directional pertubations with an ankle strategy with both a wide and narrow BOS without LOB; was going to further assess balance but pt fatigued                             Pertinent Vitals/Pain Activity to tolerance, no complaints of pain.      Home Living Family/patient expects to be discharged to:: Private residence Living Arrangements: Spouse/significant other Available Help at Discharge: Other (Comment) (wife is not in good health, has a personal care attendant come 3x/wk to assist her) Type of Home: Mobile home Home Access: Stairs to enter   Entrance Stairs-Number of Steps: 7 Home Layout: One level Home Equipment: None      Prior Function Level of Independence: Independent               Hand Dominance        Extremity/Trunk Assessment   Upper Extremity Assessment: Overall WFL for tasks assessed           Lower Extremity Assessment: Overall WFL for tasks assessed      Cervical / Trunk Assessment: Normal  Communication   Communication: Other (comment);HOH (at times, difficult to understand; requires redirecting  to conversation)  Cognition Arousal/Alertness: Awake/alert Behavior During Therapy: WFL for tasks assessed/performed Overall Cognitive Status: Within Functional Limits for tasks assessed                      General Comments      Exercises        Assessment/Plan    PT Assessment Patient needs continued PT services  PT Diagnosis Difficulty walking   PT Problem List Decreased mobility;Decreased safety awareness;Decreased knowledge of use of DME;Decreased balance  PT Treatment Interventions DME  instruction;Gait training;Stair training;Functional mobility training;Therapeutic activities;Therapeutic exercise;Patient/family education;Balance training   PT Goals (Current goals can be found in the Care Plan section) Acute Rehab PT Goals Patient Stated Goal: return to home feeling better PT Goal Formulation: With patient Time For Goal Achievement: 10/13/13 Potential to Achieve Goals: Good    Frequency Min 3X/week   Barriers to discharge        Co-evaluation               End of Session   Activity Tolerance: Patient tolerated treatment well Patient left: in chair;with call bell/phone within reach;with chair alarm set Nurse Communication: Other (comment) (pt left in chair with chair alarm)         Time: [id number] PT Time Calculation (min): 13 min   Charges:   PT Evaluation $Initial PT Evaluation Tier I: 1 Procedure PT Treatments $Gait Training: 8-22 mins   PT G Codes:          Jacqulyn Cane 10/06/2013, 4:12 PM Jacqulyn Cane SPT 10/06/2013

## 2013-10-07 ENCOUNTER — Encounter (HOSPITAL_COMMUNITY)
Admission: EM | Disposition: A | Discharge status: Home / Self Care | Payer: Self-pay | Source: Home / Self Care | Attending: Internal Medicine

## 2013-10-07 DIAGNOSIS — E871 Hypo-osmolality and hyponatremia: Secondary | ICD-10-CM

## 2013-10-07 DIAGNOSIS — N189 Chronic kidney disease, unspecified: Secondary | ICD-10-CM

## 2013-10-07 DIAGNOSIS — N179 Acute kidney failure, unspecified: Secondary | ICD-10-CM

## 2013-10-07 HISTORY — PX: GIVENS CAPSULE STUDY: SHX5432

## 2013-10-07 LAB — TYPE AND SCREEN
ABO/RH(D): O POS
ANTIBODY SCREEN: NEGATIVE
Unit division: 0
Unit division: 0

## 2013-10-07 LAB — CBC
HCT: 29.3 % — ABNORMAL LOW (ref 39.0–52.0)
Hemoglobin: 9.2 g/dL — ABNORMAL LOW (ref 13.0–17.0)
MCH: 24.5 pg — AB (ref 26.0–34.0)
MCHC: 31.4 g/dL (ref 30.0–36.0)
MCV: 77.9 fL — ABNORMAL LOW (ref 78.0–100.0)
PLATELETS: 174 10*3/uL (ref 150–400)
RBC: 3.76 MIL/uL — AB (ref 4.22–5.81)
RDW: 16.6 % — ABNORMAL HIGH (ref 11.5–15.5)
WBC: 6.4 10*3/uL (ref 4.0–10.5)

## 2013-10-07 LAB — BASIC METABOLIC PANEL
BUN: 18 mg/dL (ref 6–23)
CALCIUM: 8.7 mg/dL (ref 8.4–10.5)
CO2: 20 meq/L (ref 19–32)
Chloride: 105 mEq/L (ref 96–112)
Creatinine, Ser: 1.46 mg/dL — ABNORMAL HIGH (ref 0.50–1.35)
GFR calc Af Amer: 54 mL/min — ABNORMAL LOW (ref >90)
GFR calc non Af Amer: 47 mL/min — ABNORMAL LOW (ref >90)
Glucose, Bld: 160 mg/dL — ABNORMAL HIGH (ref 70–99)
POTASSIUM: 4.5 meq/L (ref 3.7–5.3)
SODIUM: 136 meq/L — AB (ref 137–147)

## 2013-10-07 LAB — GLUCOSE, CAPILLARY
Glucose-Capillary: 148 mg/dL — ABNORMAL HIGH (ref 70–99)
Glucose-Capillary: 146 mg/dL — ABNORMAL HIGH (ref 70–99)
Glucose-Capillary: 137 mg/dL — ABNORMAL HIGH (ref 70–99)
Glucose-Capillary: 153 mg/dL — ABNORMAL HIGH (ref 70–99)

## 2013-10-07 SURGERY — IMAGING PROCEDURE, GI TRACT, INTRALUMINAL, VIA CAPSULE
Anesthesia: LOCAL

## 2013-10-07 MED ORDER — LORAZEPAM 2 MG/ML IJ SOLN
2.0000 mg | Freq: Once | INTRAMUSCULAR | Status: DC
Start: 2013-10-07 — End: 2013-10-08

## 2013-10-07 MED ORDER — LEVOTHYROXINE SODIUM 150 MCG PO TABS
150.0000 ug | ORAL_TABLET | Freq: Every day | ORAL | Status: DC
  Administered 2013-10-08: 150 ug via ORAL
  Filled 2013-10-07 (×2): qty 1

## 2013-10-07 MED ORDER — HALOPERIDOL LACTATE 5 MG/ML IJ SOLN
3.0000 mg | Freq: Four times a day (QID) | INTRAMUSCULAR | Status: DC | PRN
  Administered 2013-10-07: 3 mg via INTRAVENOUS

## 2013-10-07 MED ORDER — ALPRAZOLAM 0.5 MG PO TABS
0.5000 mg | ORAL_TABLET | Freq: Every day | ORAL | Status: DC | PRN
  Administered 2013-10-07: 0.5 mg via ORAL
  Filled 2013-10-07: qty 1

## 2013-10-07 MED ORDER — HALOPERIDOL LACTATE 5 MG/ML IJ SOLN: 5.0000 mg | Freq: Four times a day (QID) | INTRAMUSCULAR | Status: DC | PRN

## 2013-10-07 MED ORDER — SODIUM CHLORIDE 0.9 % IV SOLN
INTRAVENOUS | Status: DC
  Administered 2013-10-07: 05:00:00 via INTRAVENOUS

## 2013-10-07 SURGICAL SUPPLY — 1 items: TOWEL COTTON PACK 4EA (MISCELLANEOUS) ×4 IMPLANT

## 2013-10-07 NOTE — Progress Notes (Signed)
[facility] Progress Note (for Drs. Hung/Mann)    Since last GI note: Per RN, no overt GI bleeding overnight.  Was heme negative.  He is to be getting pSB pill camera today to interogate small bowel for ?source of his anemia.  Tells me this AM he feels well, no overt bleeding, no abd pains.  Objective: Vital signs in last 24 hours: Temp:  [97.6 F (36.4 C)-97.7 F (36.5 C)] 97.6 F (36.4 C) (04/03 0345) Pulse Rate:  [84-99] 99 (04/03 0345) Resp:  [18] 18 (04/03 0345) BP: (151-176)/(67-74) 155/74 mmHg (04/03 0345) SpO2:  [99 %-100 %] 99 % (04/03 0345) Last BM Date: 10/06/13 General: alert and oriented times 3 Heart: regular rate and rythm Abdomen: soft, non-tender, non-distended, normal bowel sounds   Lab Results:  Recent Labs  10/05/13 1935 10/06/13 0910 10/06/13 1427 10/07/13 0401  WBC 5.1 5.6  --  6.4  HGB 6.9* 9.6* 9.8* 9.2*  PLT 184 181  --  174  MCV 75.7* 77.9*  --  77.9*    Recent Labs  10/05/13 1935 10/06/13 0910 10/07/13 0401  NA 132* 137 136*  K 5.1 5.5* 4.5  CL 100 105 105  CO2 20 21 20   GLUCOSE 244* 163* 160*  BUN 38* 30* 18  CREATININE 2.46* 1.86* 1.46*  CALCIUM 8.4 8.7 8.7    Recent Labs  10/05/13 1935 10/06/13 0910  PROT 6.2 6.6  ALBUMIN 3.6 3.7  AST 16 18  ALT 13 13  ALKPHOS 79 80  BILITOT <0.2* 0.2*    Recent Labs  10/06/13 0910  INR 0.93     Studies/Results: Dg Chest 2 View  10/05/2013   CLINICAL DATA:  Cough.  EXAM: CHEST  2 VIEW  COMPARISON:  DG CHEST PORTABLE dated 06/02/2012  FINDINGS: The lungs are clear without focal infiltrate, edema, pneumothorax or pleural effusion. Interstitial markings are diffusely coarsened with chronic features. Mild hyperexpansion is noted. The cardiopericardial silhouette is within normal limits for size. Imaged bony structures of the thorax are intact.  IMPRESSION: Mild hyperexpansion raises the question of emphysema. Otherwise no acute cardiopulmonary findings.   Electronically  Signed   By: Misty Stanley M.D.   On: 10/05/2013 20:35   US Renal  10/06/2013   CLINICAL DATA:  Acute on chronic renal failure. History of hypertension, diabetes, bladder cancer.  EXAM: RENAL/URINARY TRACT ULTRASOUND COMPLETE  COMPARISON:  CT of the chest on 06/01/2012  FINDINGS: Right Kidney:  Length: 12.1 cm. Echogenicity within normal limits. No mass or hydronephrosis visualized.  Left Kidney:  Length: 12.1 cm. Echogenicity within normal limits. No mass or hydronephrosis visualized.  Bladder:  Appears normal for degree of bladder distention.  IMPRESSION: Normal renal ultrasound.   Electronically Signed   By: Shon Hale M.D.   On: 10/06/2013 12:09     Medications: Scheduled Meds: . amLODipine  5 mg Oral Daily  . insulin aspart  0-9 Units Subcutaneous TID WC  . levothyroxine  75 mcg Intravenous QAC breakfast  . [START ON 10/09/2013] pantoprazole (PROTONIX) IV  40 mg Intravenous Q12H  . sodium chloride  3 mL Intravenous Q12H   Continuous Infusions: . sodium chloride 20 mL/hr at 10/07/13 0522  . pantoprozole (PROTONIX) infusion 8 mg/hr (10/07/13 0540)   PRN Meds:.acetaminophen, acetaminophen, albuterol, haloperidol lactate, hydrALAZINE, ondansetron (ZOFRAN) IV, ondansetron    Assessment/Plan: 73 y.o. male with anemia.  No clear that he has had bleeding from GI tract to account for his new drop in Hb.  Good bump  in Hb from 6.9 to 9's after 2 units PRBC. He is getting SB capsule endo today, result will not be ready until Monday.  IF no overt GI bleeding, he is safe from GI perspective to go home after that pill camera study is complete.  SHould consider other source (non-GI) of his anemia (renal insuff, ?retroperitoneal bleed).  I will order celiac sprue panel today (can cause IDA)    Milus Banister, MD  10/07/2013, 7:24 AM [facility] Pager [phone]

## 2013-10-07 NOTE — Progress Notes (Signed)
[facility] PROGRESS NOTE  DEMERIUS Martin [id number] DOB: Dec 16, 1940 DOA: 10/05/2013 PCP: Dwan Bolt, MD Brief HPI: Harold Martin is a 73 y.o. male with history of hypertension, diabetes mellitus and hypothyroidism started experiencing weakness dizziness and shortness of breath over the last couple of weeks. In the ER patient was noticed to have hemoglobin of 6.9 had dropped from 9.8 few days ago. Patient had EGD and colonoscopy done on March 20 by Dr. Benson Norway. EGD and colonoscopy showed AVMs and sigmoid polyp. Patient did not notice any blood in the stools or black stools. Patient does use Aleve routinely for pain but EGD has not shown any ulcers. Patient is hemodynamically stable and has been admitted for further management. GI consulted and currently getting blood transfusions 2 units ordered last night. He has normal bilirubin adn LDH is pending. His stool for occult blood is negative. We will get anemia panel.    Assessment/Plan: Symptomatic anemia leading to sob and dizziness: - admitted to telemetry. Probably from the anemia.  - received 2 units of prbc transfusion and his repeat H&h is around 9. No obvious signs of bleed.  gi consulted for further recommendations and he is scheduled to get capsule endoscopy on 4.3.15.   - stool occult blood negative.  - LDH is normal, total bili is 0.2. Anemia panel reveals ferritin of 3. And normal folate and B12 LEVELS.     2. Hypothyroidism: - TSH is normal  - on synthroid.   3. Diabetes Mellitus: - hgba1c is 7 - SSI.  - will resume glipizide on discharge.   4. Hypertension: - suboptimal  - on prn hydralazine.   5. 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  -  US RENAL negative for hydronephrosis. ,  UA negative for infection. Protein is 30 . He was starte don gentle hydration and his BUN is normal and creatinine improved to 1.46. Stopped ACE inhibitor and metformin.    6. Hyponatremia:  - once again probably from  dehydration.  - getting NS fluids.  - repeat in am shows improvement.   7. Overnight agitation and delirious: probably from being in the hospital.  - haldol given.      DVT prophylaxis.   Code Status: full code Family Communication: none at bedside Disposition Plan: pending   Consultants:  GI  Procedures:  US RENAL neg for hydroneprosis.   Capsule endoscopy on 4.3    Antibiotics:  NONE  HPI/Subjective: Comfortable. Slightly restless and agitated.   Objective: Filed Vitals:   10/07/13 0345  BP: 155/74  Pulse: 99  Temp: 97.6 F (36.4 C)  Resp: 18    Intake/Output Summary (Last 24 hours) at 10/07/13 1058 Last data filed at 10/07/13 0523  Gross per 24 hour  Intake   1480 ml  Output   1750 ml  Net   -270 ml   Filed Weights   10/05/13 2240  Weight: 108.863 kg (240 lb)    Exam:   General:  Alert afebrile comfortable  Cardiovascular: s1s2  Respiratory: ctab   Abdomen: soft NT ND BS+  Musculoskeletal: no pedal edema.   Data Reviewed: Basic Metabolic Panel:  Recent Labs Lab 10/05/13 1935 10/06/13 0910 10/07/13 0401  NA 132* 137 136*  K 5.1 5.5* 4.5  CL 100 105 105  CO2 20 21 20   GLUCOSE 244* 163* 160*  BUN 38* 30* 18  CREATININE 2.46* 1.86* 1.46*  CALCIUM 8.4 8.7 8.7   Liver Function Tests:  Recent  Labs Lab 10/05/13 1935 10/06/13 0910  AST 16 18  ALT 13 13  ALKPHOS 79 80  BILITOT <0.2* 0.2*  PROT 6.2 6.6  ALBUMIN 3.6 3.7   No results found for this basename: LIPASE, AMYLASE,  in the last 168 hours No results found for this basename: AMMONIA,  in the last 168 hours CBC:  Recent Labs Lab 10/05/13 1935 10/06/13 0910 10/06/13 1427 10/07/13 0401  WBC 5.1 5.6  --  6.4  NEUTROABS  --  4.0  --   --   HGB 6.9* 9.6* 9.8* 9.2*  HCT 22.7* 30.6* 31.8* 29.3*  MCV 75.7* 77.9*  --  77.9*  PLT 184 181  --  174   Cardiac Enzymes:  Recent Labs Lab 10/06/13 0910  TROPONINI <0.30   BNP (last 3 results) No results found for  this basename: PROBNP,  in the last 8760 hours CBG:  Recent Labs Lab 10/06/13 0754 10/06/13 1150 10/06/13 1705 10/07/13 0343 10/07/13 0749  GLUCAP 143* 145* 158* 148* 146*    No results found for this or any previous visit (from the past 240 hour(s)).   Studies: Dg Chest 2 View  10/05/2013   CLINICAL DATA:  Cough.  EXAM: CHEST  2 VIEW  COMPARISON:  DG CHEST PORTABLE dated 06/02/2012  FINDINGS: The lungs are clear without focal infiltrate, edema, pneumothorax or pleural effusion. Interstitial markings are diffusely coarsened with chronic features. Mild hyperexpansion is noted. The cardiopericardial silhouette is within normal limits for size. Imaged bony structures of the thorax are intact.  IMPRESSION: Mild hyperexpansion raises the question of emphysema. Otherwise no acute cardiopulmonary findings.   Electronically Signed   By: Misty Stanley M.D.   On: 10/05/2013 20:35   US Renal  10/06/2013   CLINICAL DATA:  Acute on chronic renal failure. History of hypertension, diabetes, bladder cancer.  EXAM: RENAL/URINARY TRACT ULTRASOUND COMPLETE  COMPARISON:  CT of the chest on 06/01/2012  FINDINGS: Right Kidney:  Length: 12.1 cm. Echogenicity within normal limits. No mass or hydronephrosis visualized.  Left Kidney:  Length: 12.1 cm. Echogenicity within normal limits. No mass or hydronephrosis visualized.  Bladder:  Appears normal for degree of bladder distention.  IMPRESSION: Normal renal ultrasound.   Electronically Signed   By: Shon Hale M.D.   On: 10/06/2013 12:09    Scheduled Meds: . amLODipine  5 mg Oral Daily  . insulin aspart  0-9 Units Subcutaneous TID WC  . levothyroxine  75 mcg Intravenous QAC breakfast  . [START ON 10/09/2013] pantoprazole (PROTONIX) IV  40 mg Intravenous Q12H  . sodium chloride  3 mL Intravenous Q12H   Continuous Infusions: . sodium chloride 20 mL/hr at 10/07/13 0522  . pantoprozole (PROTONIX) infusion 8 mg/hr (10/07/13 0540)    Principal Problem:   GI  bleed Active Problems:   DIABETES, TYPE 2   HYPERLIPIDEMIA   HYPERTENSION   DYSPNEA ON EXERTION   Acute on chronic renal failure   Hyponatremia    Time spent: 25 min    Harold Martin  [facility] Pager [phone] If 7PM-7AM, please contact night-coverage at [email or web address], password [facility] 10/07/2013, 10:58 AM  LOS: 2 days

## 2013-10-07 NOTE — Progress Notes (Signed)
PT Cancellation Note  ___Treatment cancelled today due to medical issues with patient which prohibited therapy  _X_ Treatment cancelled today due to patient receiving procedure or test ....... Enoscopy  ___ Treatment cancelled today due to patient's refusal to participate   ___ Treatment cancelled today due to  Rica Koyanagi  PTA [facility]  Acute  Rehab Pager      [phone]

## 2013-10-08 LAB — CBC
HCT: 29.4 % — ABNORMAL LOW (ref 39.0–52.0)
HEMOGLOBIN: 9 g/dL — AB (ref 13.0–17.0)
MCH: 23.9 pg — AB (ref 26.0–34.0)
MCHC: 30.6 g/dL (ref 30.0–36.0)
MCV: 78.2 fL (ref 78.0–100.0)
PLATELETS: 171 10*3/uL (ref 150–400)
RBC: 3.76 MIL/uL — ABNORMAL LOW (ref 4.22–5.81)
RDW: 17.2 % — AB (ref 11.5–15.5)
WBC: 4.8 10*3/uL (ref 4.0–10.5)

## 2013-10-08 LAB — GLUCOSE, CAPILLARY
GLUCOSE-CAPILLARY: 155 mg/dL — AB (ref 70–99)
Glucose-Capillary: 147 mg/dL — ABNORMAL HIGH (ref 70–99)

## 2013-10-08 MED ORDER — OMEPRAZOLE 20 MG PO CPDR: 40.0000 mg | DELAYED_RELEASE_CAPSULE | Freq: Every day | ORAL | Status: DC

## 2013-10-08 MED ORDER — PANTOPRAZOLE SODIUM 40 MG PO TBEC
40.0000 mg | DELAYED_RELEASE_TABLET | Freq: Every day | ORAL | Status: DC
  Administered 2013-10-08: 40 mg via ORAL
  Filled 2013-10-08: qty 1
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
## 2013-10-10 ENCOUNTER — Encounter (HOSPITAL_COMMUNITY): Payer: Self-pay | Admitting: Gastroenterology

## 2013-10-10 LAB — TISSUE TRANSGLUTAMINASE, IGA: Tissue Transglutaminase Ab, IgA: 4.2 U/mL (ref <20)

## 2013-10-10 LAB — GLIADIN ANTIBODIES, SERUM
Gliadin IgA: 4.3 U/mL (ref <20)
Gliadin IgG: 5 U/mL (ref <20)

## 2013-10-11 LAB — RETICULIN ANTIBODIES, IGA W TITER: Reticulin Ab, IgA: NEGATIVE
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
## 2015-01-16 ENCOUNTER — Emergency Department (HOSPITAL_COMMUNITY): Payer: Medicare Other

## 2015-01-16 ENCOUNTER — Encounter (HOSPITAL_COMMUNITY): Payer: Self-pay | Admitting: Neurology

## 2015-01-16 ENCOUNTER — Inpatient Hospital Stay (HOSPITAL_COMMUNITY)
Admission: EM | Admit: 2015-01-16 | Discharge: 2015-01-25 | DRG: 640 | Disposition: A | Discharge status: Skilled Nursing Facility | Payer: Medicare Other | Attending: Internal Medicine | Admitting: Internal Medicine

## 2015-01-16 DIAGNOSIS — I10 Essential (primary) hypertension: Secondary | ICD-10-CM | POA: Diagnosis not present

## 2015-01-16 DIAGNOSIS — R1310 Dysphagia, unspecified: Secondary | ICD-10-CM | POA: Diagnosis present

## 2015-01-16 DIAGNOSIS — D509 Iron deficiency anemia, unspecified: Secondary | ICD-10-CM | POA: Diagnosis present

## 2015-01-16 DIAGNOSIS — Z8551 Personal history of malignant neoplasm of bladder: Secondary | ICD-10-CM

## 2015-01-16 DIAGNOSIS — J209 Acute bronchitis, unspecified: Secondary | ICD-10-CM | POA: Diagnosis not present

## 2015-01-16 DIAGNOSIS — R410 Disorientation, unspecified: Secondary | ICD-10-CM | POA: Diagnosis present

## 2015-01-16 DIAGNOSIS — E039 Hypothyroidism, unspecified: Secondary | ICD-10-CM | POA: Diagnosis present

## 2015-01-16 DIAGNOSIS — M79606 Pain in leg, unspecified: Secondary | ICD-10-CM

## 2015-01-16 DIAGNOSIS — E872 Acidosis: Principal | ICD-10-CM | POA: Diagnosis present

## 2015-01-16 DIAGNOSIS — Z6831 Body mass index (BMI) 31.0-31.9, adult: Secondary | ICD-10-CM

## 2015-01-16 DIAGNOSIS — J9602 Acute respiratory failure with hypercapnia: Secondary | ICD-10-CM | POA: Diagnosis not present

## 2015-01-16 DIAGNOSIS — G9340 Encephalopathy, unspecified: Secondary | ICD-10-CM | POA: Diagnosis present

## 2015-01-16 DIAGNOSIS — Z781 Physical restraint status: Secondary | ICD-10-CM | POA: Diagnosis not present

## 2015-01-16 DIAGNOSIS — N289 Disorder of kidney and ureter, unspecified: Secondary | ICD-10-CM

## 2015-01-16 DIAGNOSIS — Z79899 Other long term (current) drug therapy: Secondary | ICD-10-CM

## 2015-01-16 DIAGNOSIS — D649 Anemia, unspecified: Secondary | ICD-10-CM

## 2015-01-16 DIAGNOSIS — N179 Acute kidney failure, unspecified: Secondary | ICD-10-CM | POA: Diagnosis present

## 2015-01-16 DIAGNOSIS — E1122 Type 2 diabetes mellitus with diabetic chronic kidney disease: Secondary | ICD-10-CM | POA: Diagnosis present

## 2015-01-16 DIAGNOSIS — E1165 Type 2 diabetes mellitus with hyperglycemia: Secondary | ICD-10-CM | POA: Diagnosis present

## 2015-01-16 DIAGNOSIS — N183 Chronic kidney disease, stage 3 unspecified: Secondary | ICD-10-CM | POA: Diagnosis present

## 2015-01-16 DIAGNOSIS — E860 Dehydration: Secondary | ICD-10-CM | POA: Diagnosis present

## 2015-01-16 DIAGNOSIS — R4182 Altered mental status, unspecified: Secondary | ICD-10-CM

## 2015-01-16 DIAGNOSIS — E669 Obesity, unspecified: Secondary | ICD-10-CM | POA: Diagnosis present

## 2015-01-16 DIAGNOSIS — R197 Diarrhea, unspecified: Secondary | ICD-10-CM | POA: Diagnosis present

## 2015-01-16 DIAGNOSIS — R0989 Other specified symptoms and signs involving the circulatory and respiratory systems: Secondary | ICD-10-CM

## 2015-01-16 DIAGNOSIS — R05 Cough: Secondary | ICD-10-CM

## 2015-01-16 DIAGNOSIS — R059 Cough, unspecified: Secondary | ICD-10-CM

## 2015-01-16 DIAGNOSIS — K529 Noninfective gastroenteritis and colitis, unspecified: Secondary | ICD-10-CM | POA: Diagnosis present

## 2015-01-16 DIAGNOSIS — J9690 Respiratory failure, unspecified, unspecified whether with hypoxia or hypercapnia: Secondary | ICD-10-CM

## 2015-01-16 DIAGNOSIS — J441 Chronic obstructive pulmonary disease with (acute) exacerbation: Secondary | ICD-10-CM | POA: Diagnosis present

## 2015-01-16 DIAGNOSIS — I129 Hypertensive chronic kidney disease with stage 1 through stage 4 chronic kidney disease, or unspecified chronic kidney disease: Secondary | ICD-10-CM | POA: Diagnosis present

## 2015-01-16 DIAGNOSIS — F17220 Nicotine dependence, chewing tobacco, uncomplicated: Secondary | ICD-10-CM | POA: Diagnosis present

## 2015-01-16 DIAGNOSIS — Z8673 Personal history of transient ischemic attack (TIA), and cerebral infarction without residual deficits: Secondary | ICD-10-CM | POA: Diagnosis not present

## 2015-01-16 DIAGNOSIS — J440 Chronic obstructive pulmonary disease with acute lower respiratory infection: Secondary | ICD-10-CM | POA: Diagnosis not present

## 2015-01-16 HISTORY — DX: Chronic obstructive pulmonary disease, unspecified: J44.9

## 2015-01-16 HISTORY — DX: Transient cerebral ischemic attack, unspecified: G45.9

## 2015-01-16 LAB — I-STAT CHEM 8, ED
BUN: 26 mg/dL — ABNORMAL HIGH (ref 6–20)
CALCIUM ION: 1.03 mmol/L — AB (ref 1.13–1.30)
Chloride: 97 mmol/L — ABNORMAL LOW (ref 101–111)
Creatinine, Ser: 2.2 mg/dL — ABNORMAL HIGH (ref 0.61–1.24)
Glucose, Bld: 108 mg/dL — ABNORMAL HIGH (ref 65–99)
HCT: 30 % — ABNORMAL LOW (ref 39.0–52.0)
Hemoglobin: 10.2 g/dL — ABNORMAL LOW (ref 13.0–17.0)
Potassium: 4.9 mmol/L (ref 3.5–5.1)
Sodium: 136 mmol/L (ref 135–145)
TCO2: 29 mmol/L (ref 0–100)

## 2015-01-16 LAB — CBC
HCT: 30.5 % — ABNORMAL LOW (ref 39.0–52.0)
Hemoglobin: 9 g/dL — ABNORMAL LOW (ref 13.0–17.0)
MCH: 24.6 pg — AB (ref 26.0–34.0)
MCHC: 29.5 g/dL — AB (ref 30.0–36.0)
MCV: 83.3 fL (ref 78.0–100.0)
Platelets: 278 10*3/uL (ref 150–400)
RBC: 3.66 MIL/uL — AB (ref 4.22–5.81)
RDW: 15.3 % (ref 11.5–15.5)
WBC: 8.5 10*3/uL (ref 4.0–10.5)

## 2015-01-16 LAB — COMPREHENSIVE METABOLIC PANEL
ALT: 18 U/L (ref 17–63)
AST: 18 U/L (ref 15–41)
Albumin: 3.5 g/dL (ref 3.5–5.0)
Alkaline Phosphatase: 92 U/L (ref 38–126)
Anion gap: 7 (ref 5–15)
BUN: 24 mg/dL — ABNORMAL HIGH (ref 6–20)
CALCIUM: 7.9 mg/dL — AB (ref 8.9–10.3)
CHLORIDE: 97 mmol/L — AB (ref 101–111)
CO2: 32 mmol/L (ref 22–32)
CREATININE: 2.32 mg/dL — AB (ref 0.61–1.24)
GFR calc Af Amer: 31 mL/min — ABNORMAL LOW (ref >60)
GFR calc non Af Amer: 26 mL/min — ABNORMAL LOW (ref >60)
Glucose, Bld: 108 mg/dL — ABNORMAL HIGH (ref 65–99)
POTASSIUM: 5 mmol/L (ref 3.5–5.1)
Sodium: 136 mmol/L (ref 135–145)
Total Bilirubin: 0.6 mg/dL (ref 0.3–1.2)
Total Protein: 6.4 g/dL — ABNORMAL LOW (ref 6.5–8.1)

## 2015-01-16 LAB — PROTIME-INR
INR: 1.04 (ref 0.00–1.49)
Prothrombin Time: 13.8 seconds (ref 11.6–15.2)

## 2015-01-16 LAB — DIFFERENTIAL
BASOS PCT: 0 % (ref 0–1)
Basophils Absolute: 0 10*3/uL (ref 0.0–0.1)
EOS PCT: 5 % (ref 0–5)
Eosinophils Absolute: 0.5 10*3/uL (ref 0.0–0.7)
LYMPHS ABS: 0.6 10*3/uL — AB (ref 0.7–4.0)
Lymphocytes Relative: 7 % — ABNORMAL LOW (ref 12–46)
Monocytes Absolute: 0.8 10*3/uL (ref 0.1–1.0)
Monocytes Relative: 9 % (ref 3–12)
Neutro Abs: 6.7 10*3/uL (ref 1.7–7.7)
Neutrophils Relative %: 79 % — ABNORMAL HIGH (ref 43–77)

## 2015-01-16 LAB — VITAMIN B12: Vitamin B-12: 1485 pg/mL — ABNORMAL HIGH (ref 180–914)

## 2015-01-16 LAB — CBG MONITORING, ED: Glucose-Capillary: 98 mg/dL (ref 65–99)

## 2015-01-16 LAB — GLUCOSE, CAPILLARY: Glucose-Capillary: 181 mg/dL — ABNORMAL HIGH (ref 65–99)

## 2015-01-16 LAB — APTT: APTT: 27 s (ref 24–37)

## 2015-01-16 LAB — I-STAT TROPONIN, ED: Troponin i, poc: 0 ng/mL (ref 0.00–0.08)

## 2015-01-16 LAB — TSH: TSH: 0.398 u[IU]/mL (ref 0.350–4.500)

## 2015-01-16 LAB — AMMONIA: Ammonia: 12 umol/L (ref 9–35)

## 2015-01-16 MED ORDER — ONDANSETRON HCL 4 MG/2ML IJ SOLN: 4.0000 mg | Freq: Four times a day (QID) | INTRAMUSCULAR | Status: DC | PRN

## 2015-01-16 MED ORDER — PANTOPRAZOLE SODIUM 40 MG PO TBEC
80.0000 mg | DELAYED_RELEASE_TABLET | Freq: Every day | ORAL | Status: DC
  Administered 2015-01-16 – 2015-01-19 (×3): 80 mg via ORAL
  Filled 2015-01-16 (×3): qty 2

## 2015-01-16 MED ORDER — ALBUTEROL SULFATE (2.5 MG/3ML) 0.083% IN NEBU
2.5000 mg | INHALATION_SOLUTION | Freq: Four times a day (QID) | RESPIRATORY_TRACT | Status: DC | PRN
  Administered 2015-01-17: 2.5 mg via RESPIRATORY_TRACT
  Filled 2015-01-16: qty 3

## 2015-01-16 MED ORDER — SODIUM CHLORIDE 0.9 % IJ SOLN
3.0000 mL | Freq: Two times a day (BID) | INTRAMUSCULAR | Status: DC
  Administered 2015-01-18 – 2015-01-24 (×12): 3 mL via INTRAVENOUS

## 2015-01-16 MED ORDER — ALBUTEROL SULFATE HFA 108 (90 BASE) MCG/ACT IN AERS: 2.0000 | INHALATION_SPRAY | Freq: Four times a day (QID) | RESPIRATORY_TRACT | Status: DC | PRN

## 2015-01-16 MED ORDER — LEVOTHYROXINE SODIUM 150 MCG PO TABS
150.0000 ug | ORAL_TABLET | Freq: Every day | ORAL | Status: DC
  Administered 2015-01-17 – 2015-01-19 (×3): 150 ug via ORAL
  Filled 2015-01-16 (×7): qty 1

## 2015-01-16 MED ORDER — ACETAMINOPHEN 650 MG RE SUPP: 650.0000 mg | Freq: Four times a day (QID) | RECTAL | Status: DC | PRN

## 2015-01-16 MED ORDER — INSULIN ASPART 100 UNIT/ML ~~LOC~~ SOLN
0.0000 [IU] | Freq: Three times a day (TID) | SUBCUTANEOUS | Status: DC
  Administered 2015-01-17 (×2): 1 [IU] via SUBCUTANEOUS
  Administered 2015-01-17 – 2015-01-19 (×7): 2 [IU] via SUBCUTANEOUS
  Administered 2015-01-20: 5 [IU] via SUBCUTANEOUS
  Administered 2015-01-20: 2 [IU] via SUBCUTANEOUS
  Administered 2015-01-20: 0 [IU] via SUBCUTANEOUS
  Administered 2015-01-21: 2 [IU] via SUBCUTANEOUS

## 2015-01-16 MED ORDER — ONDANSETRON HCL 4 MG PO TABS: 4.0000 mg | ORAL_TABLET | Freq: Four times a day (QID) | ORAL | Status: DC | PRN

## 2015-01-16 MED ORDER — ENOXAPARIN SODIUM 40 MG/0.4ML ~~LOC~~ SOLN
40.0000 mg | SUBCUTANEOUS | Status: DC
  Administered 2015-01-16 – 2015-01-24 (×8): 40 mg via SUBCUTANEOUS
  Filled 2015-01-16 (×10): qty 0.4

## 2015-01-16 MED ORDER — ACETAMINOPHEN 325 MG PO TABS
650.0000 mg | ORAL_TABLET | Freq: Four times a day (QID) | ORAL | Status: DC | PRN
  Administered 2015-01-16: 650 mg via ORAL
  Filled 2015-01-16: qty 2

## 2015-01-16 MED ORDER — SODIUM CHLORIDE 0.9 % IV SOLN
INTRAVENOUS | Status: DC
  Administered 2015-01-16 – 2015-01-24 (×4): via INTRAVENOUS
  Filled 2015-01-16: qty 1000

## 2015-01-16 MED ORDER — MAGNESIUM OXIDE 400 MG PO TABS
400.0000 mg | ORAL_TABLET | Freq: Every day | ORAL | Status: DC
  Administered 2015-01-16 – 2015-01-19 (×3): 400 mg via ORAL
  Filled 2015-01-16 (×7): qty 1

## 2015-01-16 MED ORDER — INSULIN ASPART 100 UNIT/ML ~~LOC~~ SOLN
0.0000 [IU] | Freq: Every day | SUBCUTANEOUS | Status: DC
  Administered 2015-01-18: 0 [IU] via SUBCUTANEOUS

## 2015-01-16 NOTE — ED Provider Notes (Signed)
CSN: [id number]     Arrival date & time 01/16/15  1445 History   First MD Initiated Contact with Patient 01/16/15 1537     Chief complaint: Altered mental status  (Consider location/radiation/quality/duration/timing/severity/associated sxs/prior Treatment) The history is provided by the patient and a relative. The history is limited by the condition of the patient (Altered mental status).   74 year old male was last seen normal last night. He was confused when he woke up and had fallen several times at home and speech was slurred. Family also thought they noted some tremor. May wash him at home to see if would improve and it did not so they brought him to the ED. There were a total of 3 separate falls. He has had history of falls in the past but the last one was several months ago. He had seen his PCP yesterday who had taken him off of metformin but there were no other medication changes. His home medications include albuterol, amlodipine, lisinopril, glipizide, Januvia, Norco, levothyroxine, magnesium oxide, omeprazole, tamsulosin, and loperamide. He is not on any anti-coagulates or antiplatelets regions. There is been no fever and no cough and no urinary symptoms. He denies headache or pain anywhere. Family states that he has had prior falls but none in the last several months.  Past Medical History  Diagnosis Date  . Hypertension   . Diabetes mellitus without complication   . Hypothyroidism   . Shortness of breath   . TIA (transient ischemic attack)    Past Surgical History  Procedure Laterality Date  . Surgery for bladder cancer  yrs ago  . Esophagogastroduodenoscopy N/A 09/23/2013    Procedure: ESOPHAGOGASTRODUODENOSCOPY (EGD);  Surgeon: Beryle Beams, MD;  Location: [facility];  Service: Endoscopy;  Laterality: N/A;  . Colonoscopy N/A 09/23/2013    Procedure: COLONOSCOPY;  Surgeon: Beryle Beams, MD;  Location: [facility];  Service: Endoscopy;  Laterality: N/A;  . Givens capsule  study N/A 10/07/2013    Procedure: GIVENS CAPSULE STUDY;  Surgeon: Beryle Beams, MD;  Location: [facility];  Service: Endoscopy;  Laterality: N/A;   No family history on file. History  Substance Use Topics  . Smoking status: Never Smoker   . Smokeless tobacco: Current User    Types: Chew  . Alcohol Use: No    Review of Systems  Unable to perform ROS: Mental status change      Allergies  Prednisone  Home Medications   Prior to Admission medications   Medication Sig Start Date End Date Taking? Authorizing Provider  albuterol (PROVENTIL HFA;VENTOLIN HFA) 108 (90 BASE) MCG/ACT inhaler Inhale 2 puffs into the lungs every 6 (six) hours as needed for wheezing or shortness of breath.    Historical Provider, MD  amLODipine (NORVASC) 5 MG tablet Take 5 mg by mouth every morning.    Historical Provider, MD  glipiZIDE (GLUCOTROL) 10 MG tablet Take 10 mg by mouth daily before breakfast.    Historical Provider, MD  HYDROcodone-acetaminophen (NORCO/VICODIN) 5-325 MG per tablet Take 1 tablet by mouth every 6 (six) hours as needed for moderate pain.    Historical Provider, MD  levothyroxine (SYNTHROID, LEVOTHROID) 150 MCG tablet Take 150 mcg by mouth daily before breakfast.    Historical Provider, MD  magnesium oxide (MAG-OX) 400 MG tablet Take 400 mg by mouth daily.    Historical Provider, MD  omeprazole (PRILOSEC) 20 MG capsule Take 2 capsules (40 mg total) by mouth daily. 10/08/13   Hosie Poisson, MD   BP 111/48  mmHg  Pulse 75  Temp(Src) 98.5 F (36.9 C) (Oral)  Resp 14  SpO2 93% Physical Exam  Nursing note and vitals reviewed.  74 year old male, resting comfortably and in no acute distress. Vital signs are normal. Oxygen saturation is 93%, which is normal. Head is normocephalic and atraumatic. PERRLA, EOMI. Oropharynx is clear. Fundi show no hemorrhage, exudate, or papilledema. Neck is nontender and supple without adenopathy or JVD. There are no carotid bruits. Back is nontender and  there is no CVA tenderness. Lungs are clear without rales, wheezes, or rhonchi. Chest is nontender. Heart has regular rate and rhythm without murmur. Abdomen is soft, flat, nontender without masses or hepatosplenomegaly and peristalsis is normoactive. Extremities have no cyanosis or edema, full range of motion is present. Skin is warm and dry without rash. Neurologic: He is awake and alert but speech is somewhat dysarthric. He is oriented to person and place but not time. Cranial nerves are intact, there are no motor or sensory deficits.  ED Course  Procedures (including critical care time) Labs Review Results for orders placed or performed during the hospital encounter of 01/16/15  Protime-INR  Result Value Ref Range   Prothrombin Time 13.8 11.6 - 15.2 seconds   INR 1.04 0.00 - 1.49  APTT  Result Value Ref Range   aPTT 27 24 - 37 seconds  CBC  Result Value Ref Range   WBC 8.5 4.0 - 10.5 K/uL   RBC 3.66 (L) 4.22 - 5.81 MIL/uL   Hemoglobin 9.0 (L) 13.0 - 17.0 g/dL   HCT 30.5 (L) 39.0 - 52.0 %   MCV 83.3 78.0 - 100.0 fL   MCH 24.6 (L) 26.0 - 34.0 pg   MCHC 29.5 (L) 30.0 - 36.0 g/dL   RDW 15.3 11.5 - 15.5 %   Platelets 278 150 - 400 K/uL  Differential  Result Value Ref Range   Neutrophils Relative % 79 (H) 43 - 77 %   Neutro Abs 6.7 1.7 - 7.7 K/uL   Lymphocytes Relative 7 (L) 12 - 46 %   Lymphs Abs 0.6 (L) 0.7 - 4.0 K/uL   Monocytes Relative 9 3 - 12 %   Monocytes Absolute 0.8 0.1 - 1.0 K/uL   Eosinophils Relative 5 0 - 5 %   Eosinophils Absolute 0.5 0.0 - 0.7 K/uL   Basophils Relative 0 0 - 1 %   Basophils Absolute 0.0 0.0 - 0.1 K/uL  Comprehensive metabolic panel  Result Value Ref Range   Sodium 136 135 - 145 mmol/L   Potassium 5.0 3.5 - 5.1 mmol/L   Chloride 97 (L) 101 - 111 mmol/L   CO2 32 22 - 32 mmol/L   Glucose, Bld 108 (H) 65 - 99 mg/dL   BUN 24 (H) 6 - 20 mg/dL   Creatinine, Ser 2.32 (H) 0.61 - 1.24 mg/dL   Calcium 7.9 (L) 8.9 - 10.3 mg/dL   Total Protein  6.4 (L) 6.5 - 8.1 g/dL   Albumin 3.5 3.5 - 5.0 g/dL   AST 18 15 - 41 U/L   ALT 18 17 - 63 U/L   Alkaline Phosphatase 92 38 - 126 U/L   Total Bilirubin 0.6 0.3 - 1.2 mg/dL   GFR calc non Af Amer 26 (L) >60 mL/min   GFR calc Af Amer 31 (L) >60 mL/min   Anion gap 7 5 - 15  I-stat troponin, ED (not at [facility], [facility])  Result Value Ref Range   Troponin i, poc 0.00 0.00 -  0.08 ng/mL   Comment 3          CBG monitoring, ED  Result Value Ref Range   Glucose-Capillary 98 65 - 99 mg/dL  I-Stat Chem 8, ED  (not at [facility])  Result Value Ref Range   Sodium 136 135 - 145 mmol/L   Potassium 4.9 3.5 - 5.1 mmol/L   Chloride 97 (L) 101 - 111 mmol/L   BUN 26 (H) 6 - 20 mg/dL   Creatinine, Ser 2.20 (H) 0.61 - 1.24 mg/dL   Glucose, Bld 108 (H) 65 - 99 mg/dL   Calcium, Ion 1.03 (L) 1.13 - 1.30 mmol/L   TCO2 29 0 - 100 mmol/L   Hemoglobin 10.2 (L) 13.0 - 17.0 g/dL   HCT 30.0 (L) 39.0 - 52.0 %   Imaging Review Dg Chest 2 View  01/16/2015   CLINICAL DATA:  Altered mental status for 1 day  EXAM: CHEST - 2 VIEW  COMPARISON:  09/09/14  FINDINGS: Cardiac shadow is stable. The lungs are clear bilaterally. No focal infiltrate is seen. No acute bony abnormality is noted.  IMPRESSION: No active disease.   Electronically Signed   By: Inez Catalina M.D.   On: 01/16/2015 16:43   Ct Head Wo Contrast  01/16/2015   CLINICAL DATA:  Initial encounter for confusion and weakness since 6 a.m. this morning.  EXAM: CT HEAD WITHOUT CONTRAST  TECHNIQUE: Contiguous axial images were obtained from the base of the skull through the vertex without intravenous contrast.  COMPARISON:  Brain MRI from 09/11/2014.  Head CT from 09/09/2014.  FINDINGS: There is no evidence for acute hemorrhage, hydrocephalus, mass lesion, or abnormal extra-axial fluid collection. No definite CT evidence for acute infarction. Diffuse loss of parenchymal volume is consistent with atrophy. Patchy low attenuation in the deep hemispheric and periventricular white  matter is nonspecific, but likely reflects chronic microvascular ischemic demyelination. The visualized paranasal sinuses and mastoid air cells are clear.  IMPRESSION: Stable. Atrophy with mild chronic small vessel white matter disease. No acute intracranial abnormality.   Electronically Signed   By: Misty Stanley M.D.   On: 01/16/2015 16:21     EKG Interpretation   Date/Time:  Tuesday January 16 2015 14:49:38 EDT Ventricular Rate:  77 PR Interval:  196 QRS Duration: 96 QT Interval:  410 QTC Calculation: 463 R Axis:   31 Text Interpretation:  Normal sinus rhythm Incomplete right bundle branch  block Borderline ECG When compared with ECG of 10/05/2013, No significant  change was found Confirmed by [facility]  MD, Zayd Bonet ([id number]) on 01/16/2015  3:41:59 PM      MDM   Final diagnoses:  Altered mental status  Renal insufficiency  Normocytic anemia    Altered mental status. No clear evidence of stroke. Even if stroke were present, he is outside the window for code stroke activation since he was last seen normal last night-at least 17 hours ago. Workup is initiated including a CT scan. Also noted is worsening of chronic renal failure, although last value to compare with was over one year ago.   CT is unremarkable. Anemia is present and unchanged from baseline. Case is discussed with Dr. Carles Collet of [facility] who agrees to admit the patient.    Delora Fuel, MD [id number]

## 2015-01-16 NOTE — Progress Notes (Addendum)
Patient is extremely confused and impulsive. Have requested sitter to be at bedside with patient. Patient would like ibruprofen to sleep. Patient has had two month history of diarrhea. Notified Baltazar Najjar NP  [phone]- Patient has knocked 2nd Iv d/t anxiety and agitation. Will defer to am nurse.

## 2015-01-16 NOTE — H&P (Signed)
History and Physical  Harold Martin [id number] DOB: 1941-02-24 DOA: 01/16/2015   PCP: Dwan Bolt, MD  Referring Physician: ED/ Dr. Roxanne Mins  Chief Complaint: Confusion  HPI:  74 year old male with a history of hypertension, diabetes mellitus, CKD stage II-III, hypothyroidism presents with one-day history of confusion. According to the patient's family at the bedside, the patient was his usual self on the night before admission. When the patient woke up on the morning of admission, the patient was noted to be confused. In addition, he's been unstable and his gait but has not fallen recently at home. There has been no recent fevers, chills, vomiting, but the patient endorses chronic loose stools for the past 2 months without any hematochezia or melena. The patient denies any chest pain, harsh breath, coughing, hemoptysis. Patient denies any recent antibiotics. He denies any headaches or visual disturbance or focal extremity weakness. There is no dysarthria. In the emergency department, patient was noted to have a serum creatinine of 2.3 to a potassium of 5.0. WBC was 8.5 with hemoglobin 9.0. INR was 1.04. Chest x-ray was negative. CT of the brain was unremarkable. Assessment/Plan: Acute encephalopathy -Likely secondary to dehydration and acute on chronic renal failure -Serum B12 -ammonia -Check TSH -RBC folate -RPR -Urinalysis -CT brain negative Acute on chronic renal failure (CKD stage III) -Likely secondary to volume depletion as well as recent NSAID use -The patient has been taking naproxen daily for the past 2 weeks for leg pains -IV fluids -Baseline creatinine 1.5-1.6 Diabetes mellitus type 2 -NovoLog sliding scale -Hemoglobin A1c -Hold glipizide Hypertension -Holld amlodipine as the patient's blood pressure is a little soft and emergency department Anemia--iron deficiency -Baseline hemoglobin 8-9 -10/06/2013 iron saturation 3% -09/23/2013 colonoscopy showed  descending colon AVMs -09/23/2013 EGD showed antral AVMs -Iron supplementation Loose stools -C. difficile PCR -Although this may be due to the patient's metformin that was discontinued by his primary care provider 2 days prior to this admission Leg pain and edema -Venous duplex r/o DVT     Past Medical History  Diagnosis Date  . Hypertension   . Diabetes mellitus without complication   . Hypothyroidism   . Shortness of breath   . TIA (transient ischemic attack)    Past Surgical History  Procedure Laterality Date  . Surgery for bladder cancer  yrs ago  . Esophagogastroduodenoscopy N/A 09/23/2013    Procedure: ESOPHAGOGASTRODUODENOSCOPY (EGD);  Surgeon: Beryle Beams, MD;  Location: [facility];  Service: Endoscopy;  Laterality: N/A;  . Colonoscopy N/A 09/23/2013    Procedure: COLONOSCOPY;  Surgeon: Beryle Beams, MD;  Location: [facility];  Service: Endoscopy;  Laterality: N/A;  . Givens capsule study N/A 10/07/2013    Procedure: GIVENS CAPSULE STUDY;  Surgeon: Beryle Beams, MD;  Location: [facility];  Service: Endoscopy;  Laterality: N/A;   Social History:  reports that he has never smoked. His smokeless tobacco use includes Chew. He reports that he does not drink alcohol or use illicit drugs.   Family history:  Family history reviewed. No pertinent family history  Allergies  Allergen Reactions  . Prednisone     REACTION: Reaction not known      Prior to Admission medications   Medication Sig Start Date End Date Taking? Authorizing Provider  albuterol (PROVENTIL HFA;VENTOLIN HFA) 108 (90 BASE) MCG/ACT inhaler Inhale 2 puffs into the lungs every 6 (six) hours as needed for wheezing or shortness of breath.    Historical Provider, MD  amLODipine ([place])  5 MG tablet Take 5 mg by mouth every morning.    Historical Provider, MD  glipiZIDE (GLUCOTROL) 10 MG tablet Take 10 mg by mouth daily before breakfast.    Historical Provider, MD  HYDROcodone-acetaminophen  (NORCO/VICODIN) 5-325 MG per tablet Take 1 tablet by mouth every 6 (six) hours as needed for moderate pain.    Historical Provider, MD  levothyroxine (SYNTHROID, LEVOTHROID) 150 MCG tablet Take 150 mcg by mouth daily before breakfast.    Historical Provider, MD  magnesium oxide (MAG-OX) 400 MG tablet Take 400 mg by mouth daily.    Historical Provider, MD  omeprazole (PRILOSEC) 20 MG capsule Take 2 capsules (40 mg total) by mouth daily. 10/08/13   Hosie Poisson, MD    Review of Systems:  Constitutional:  No weight loss, night sweats, Fevers, chills, fatigue.  Head&Eyes: No headache.  No vision loss.  No eye pain or scotoma ENT:  No Difficulty swallowing,Tooth/dental problems,Sore throat,   Cardio-vascular:  No chest pain, Orthopnea, PND, swelling in lower extremities,  dizziness, palpitations  GI:  No  abdominal pain, nausea, vomiting, diarrhea, loss of appetite, hematochezia, melena, heartburn, indigestion, Resp:  No shortness of breath with exertion or at rest. No cough. No coughing up of blood .No wheezing.No chest wall deformity  Skin:  no rash or lesions.  GU:  no dysuria, change in color of urine, no urgency or frequency. No flank pain.  Musculoskeletal:  No joint pain or swelling. No decreased range of motion. No back pain.  Psych:  No change in mood or affect. No depression or anxiety. Neurologic: No headache, no dysesthesia, no focal weakness, no vision loss. No syncope  Physical Exam: Filed Vitals:   01/16/15 1455 01/16/15 1548 01/16/15 1700  BP: 111/48 124/57 115/51  Pulse: 75 79 76  Temp: 98.5 F (36.9 C)    TempSrc: Oral    Resp: 14 21 23   SpO2: 93% 93% 95%   General:  A&O x 3, NAD, nontoxic, pleasant/cooperative Head/Eye: No conjunctival hemorrhage, no icterus, [place]/AT, No nystagmus ENT:  No icterus,  No thrush, good dentition, no pharyngeal exudate Neck:  No masses, no lymphadenpathy, no bruits CV:  RRR, no rub, no gallop, no S3 Lung:  Bibasilar rales without  wheezing. Good air movement. Abdomen: soft/NT, +BS, nondistended, no peritoneal signs Ext: No cyanosis, No rashes, No petechiae, No lymphangitis, No edema Neuro: CNII-XII intact, strength 4/5 in bilateral upper and lower extremities, no dysmetria  Labs on Admission:  Basic Metabolic Panel:  Recent Labs Lab 01/16/15 1510 01/16/15 1519  NA 136 136  K 5.0 4.9  CL 97* 97*  CO2 32  --   GLUCOSE 108* 108*  BUN 24* 26*  CREATININE 2.32* 2.20*  CALCIUM 7.9*  --    Liver Function Tests:  Recent Labs Lab 01/16/15 1510  AST 18  ALT 18  ALKPHOS 92  BILITOT 0.6  PROT 6.4*  ALBUMIN 3.5   No results for input(s): LIPASE, AMYLASE in the last 168 hours. No results for input(s): AMMONIA in the last 168 hours. CBC:  Recent Labs Lab 01/16/15 1510 01/16/15 1519  WBC 8.5  --   NEUTROABS 6.7  --   HGB 9.0* 10.2*  HCT 30.5* 30.0*  MCV 83.3  --   PLT 278  --    Cardiac Enzymes: No results for input(s): CKTOTAL, CKMB, CKMBINDEX, TROPONINI in the last 168 hours. BNP: Invalid input(s): POCBNP CBG:  Recent Labs Lab 01/16/15 1541  GLUCAP 98    Radiological  Exams on Admission: Dg Chest 2 View  01/16/2015   CLINICAL DATA:  Altered mental status for 1 day  EXAM: CHEST - 2 VIEW  COMPARISON:  09/09/14  FINDINGS: Cardiac shadow is stable. The lungs are clear bilaterally. No focal infiltrate is seen. No acute bony abnormality is noted.  IMPRESSION: No active disease.   Electronically Signed   By: Inez Catalina M.D.   On: 01/16/2015 16:43   Ct Head Wo Contrast  01/16/2015   CLINICAL DATA:  Initial encounter for confusion and weakness since 6 a.m. this morning.  EXAM: CT HEAD WITHOUT CONTRAST  TECHNIQUE: Contiguous axial images were obtained from the base of the skull through the vertex without intravenous contrast.  COMPARISON:  Brain MRI from 09/11/2014.  Head CT from 09/09/2014.  FINDINGS: There is no evidence for acute hemorrhage, hydrocephalus, mass lesion, or abnormal extra-axial fluid  collection. No definite CT evidence for acute infarction. Diffuse loss of parenchymal volume is consistent with atrophy. Patchy low attenuation in the deep hemispheric and periventricular white matter is nonspecific, but likely reflects chronic microvascular ischemic demyelination. The visualized paranasal sinuses and mastoid air cells are clear.  IMPRESSION: Stable. Atrophy with mild chronic small vessel white matter disease. No acute intracranial abnormality.   Electronically Signed   By: Misty Stanley M.D.   On: 01/16/2015 16:21    EKG: Independently reviewed. Sinus rhythm, nonspecific ST changes    Time spent:70 minutes Code Status:   FULL Family Communication:   Family at bedside   Henslee Lottman, DO  [facility] Pager [phone]  If 7PM-7AM, please contact night-coverage [email or web address] Password [facility] 01/16/2015, 5:57 PM

## 2015-01-16 NOTE — ED Notes (Signed)
Pt given urinal and asked to provide urine

## 2015-01-16 NOTE — ED Notes (Signed)
Attempted report 

## 2015-01-16 NOTE — ED Notes (Signed)
Per family, this morning pt woke up at 0600 and was confused. Was weak, stood to get into chair and fell. He also had slurred speech, at 0830 pt fell again in the bathroom, followed by a 3rd fall in the kitchen with continued slurred speech. Currently pt is alert, speech is slurred. Denies pain. Equal grips, family thinks left eye is droopy.

## 2015-01-16 NOTE — ED Notes (Signed)
Pt at xray

## 2015-01-16 NOTE — ED Notes (Signed)
Dr. Roxanne Mins updating pt and family on admission.

## 2015-01-17 ENCOUNTER — Inpatient Hospital Stay (HOSPITAL_COMMUNITY): Payer: Medicare Other

## 2015-01-17 LAB — URINALYSIS, ROUTINE W REFLEX MICROSCOPIC
Glucose, UA: NEGATIVE mg/dL
HGB URINE DIPSTICK: NEGATIVE
KETONES UR: 15 mg/dL — AB
LEUKOCYTES UA: NEGATIVE
Nitrite: NEGATIVE
Protein, ur: 30 mg/dL — AB
Specific Gravity, Urine: 1.029 (ref 1.005–1.030)
UROBILINOGEN UA: 0.2 mg/dL (ref 0.0–1.0)
pH: 6 (ref 5.0–8.0)

## 2015-01-17 LAB — COMPREHENSIVE METABOLIC PANEL
ALBUMIN: 3.3 g/dL — AB (ref 3.5–5.0)
ALK PHOS: 81 U/L (ref 38–126)
ALT: 17 U/L (ref 17–63)
AST: 18 U/L (ref 15–41)
Anion gap: 8 (ref 5–15)
BILIRUBIN TOTAL: 0.4 mg/dL (ref 0.3–1.2)
BUN: 24 mg/dL — AB (ref 6–20)
CALCIUM: 7.8 mg/dL — AB (ref 8.9–10.3)
CO2: 31 mmol/L (ref 22–32)
Chloride: 98 mmol/L — ABNORMAL LOW (ref 101–111)
Creatinine, Ser: 2.13 mg/dL — ABNORMAL HIGH (ref 0.61–1.24)
GFR calc non Af Amer: 29 mL/min — ABNORMAL LOW (ref >60)
GFR, EST AFRICAN AMERICAN: 34 mL/min — AB (ref >60)
Glucose, Bld: 129 mg/dL — ABNORMAL HIGH (ref 65–99)
Potassium: 4.7 mmol/L (ref 3.5–5.1)
Sodium: 137 mmol/L (ref 135–145)
Total Protein: 6.2 g/dL — ABNORMAL LOW (ref 6.5–8.1)

## 2015-01-17 LAB — CBC
HCT: 29.9 % — ABNORMAL LOW (ref 39.0–52.0)
Hemoglobin: 8.9 g/dL — ABNORMAL LOW (ref 13.0–17.0)
MCH: 25.4 pg — AB (ref 26.0–34.0)
MCHC: 29.8 g/dL — AB (ref 30.0–36.0)
MCV: 85.2 fL (ref 78.0–100.0)
Platelets: 266 10*3/uL (ref 150–400)
RBC: 3.51 MIL/uL — ABNORMAL LOW (ref 4.22–5.81)
RDW: 15.5 % (ref 11.5–15.5)
WBC: 9.5 10*3/uL (ref 4.0–10.5)

## 2015-01-17 LAB — RPR: RPR Ser Ql: NONREACTIVE

## 2015-01-17 LAB — GLUCOSE, CAPILLARY
GLUCOSE-CAPILLARY: 158 mg/dL — AB (ref 65–99)
GLUCOSE-CAPILLARY: 217 mg/dL — AB (ref 65–99)
Glucose-Capillary: 125 mg/dL — ABNORMAL HIGH (ref 65–99)
Glucose-Capillary: 147 mg/dL — ABNORMAL HIGH (ref 65–99)

## 2015-01-17 LAB — RAPID URINE DRUG SCREEN, HOSP PERFORMED
Amphetamines: NOT DETECTED
BENZODIAZEPINES: NOT DETECTED
Barbiturates: NOT DETECTED
Cocaine: NOT DETECTED
OPIATES: NOT DETECTED
Tetrahydrocannabinol: NOT DETECTED

## 2015-01-17 LAB — URINE MICROSCOPIC-ADD ON

## 2015-01-17 LAB — CLOSTRIDIUM DIFFICILE BY PCR: Toxigenic C. Difficile by PCR: NEGATIVE

## 2015-01-17 LAB — MAGNESIUM: MAGNESIUM: 5 mg/dL — AB (ref 1.7–2.4)

## 2015-01-17 LAB — HIV ANTIBODY (ROUTINE TESTING W REFLEX): HIV Screen 4th Generation wRfx: NONREACTIVE

## 2015-01-17 MED ORDER — LORAZEPAM BOLUS VIA INFUSION: 1.0000 mg | Freq: Once | INTRAVENOUS | Status: DC

## 2015-01-17 MED ORDER — SODIUM CHLORIDE 0.9 % IV SOLN
INTRAVENOUS | Status: DC
  Administered 2015-01-17: 17:00:00 via INTRAVENOUS

## 2015-01-17 MED ORDER — DIPHENOXYLATE-ATROPINE 2.5-0.025 MG PO TABS
1.0000 | ORAL_TABLET | Freq: Two times a day (BID) | ORAL | Status: DC
  Administered 2015-01-17 – 2015-01-19 (×4): 1 via ORAL
  Filled 2015-01-17 (×4): qty 1

## 2015-01-17 MED ORDER — HALOPERIDOL LACTATE 5 MG/ML IJ SOLN: 5.0000 mg | Freq: Once | INTRAMUSCULAR | Status: DC

## 2015-01-17 MED ORDER — TAMSULOSIN HCL 0.4 MG PO CAPS
0.4000 mg | ORAL_CAPSULE | Freq: Every day | ORAL | Status: DC
Start: 2015-01-18 — End: 2015-01-20
  Administered 2015-01-19: 0.4 mg via ORAL
  Filled 2015-01-17 (×2): qty 1

## 2015-01-17 MED ORDER — LORAZEPAM 2 MG/ML IJ SOLN
1.0000 mg | Freq: Once | INTRAMUSCULAR | Status: AC
  Administered 2015-01-17: 1 mg via INTRAVENOUS
  Filled 2015-01-17: qty 1

## 2015-01-17 MED ORDER — SODIUM CHLORIDE 0.9 % IV BOLUS (SEPSIS)
1000.0000 mL | Freq: Once | INTRAVENOUS | Status: AC
  Administered 2015-01-17: 1000 mL via INTRAVENOUS

## 2015-01-17 MED ORDER — LORAZEPAM 2 MG/ML IJ SOLN: 1.0000 mg | Freq: Once | INTRAMUSCULAR | Status: AC | PRN

## 2015-01-17 NOTE — Evaluation (Signed)
Physical Therapy Evaluation Patient Details Name: Harold Martin MRN: [id number] DOB: May 31, 1941 Today's Date: 01/17/2015   History of Present Illness  74 year old male with a history of hypertension, diabetes mellitus, CKD stage II-III, hypothyroidism presents with one-day history of confusion. Admitted for acute encaphalopathy.  Clinical Impression  Pt admitted with the above diagnosis. Pt currently with functional limitations due to the deficits listed below (see PT Problem List). Impulsive and disoriented to time place and situation. Difficulty following commands at times and trouble sequencing with a rolling walker requiring min assist and verbal cues for safety. 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 Pt will benefit from skilled PT to increase their independence and safety with mobility to allow discharge to the venue listed below.       Follow Up Recommendations SNF;Supervision/Assistance - 24 hour (Unless pt can arrange 24 hour care at home, in that case HHPT would be appropriate)    Equipment Recommendations  None recommended by PT    Recommendations for Other Services OT consult     Precautions / Restrictions Precautions Precautions: Fall Precaution Comments: monitor O2 Restrictions Weight Bearing Restrictions: No      Mobility  Bed Mobility               General bed mobility comments: sitting in chair  Transfers Overall transfer level: Needs assistance Equipment used: Rolling walker (2 wheeled) Transfers: Sit to/from Stand Sit to Stand: Min guard         General transfer comment: Min guard for safety. Impulsive to stand prior to given instructions. VC for hand placement. Mild sway noted but able to self correct with support on RW.  Ambulation/Gait Ambulation/Gait assistance: Min assist Ambulation Distance (Feet): 80 Feet Assistive device: Rolling  walker (2 wheeled) Gait Pattern/deviations: Step-through pattern;Decreased stride length;Drifts right/left;Trunk flexed Gait velocity: cues to slow down at times Gait velocity interpretation: at or above normal speed for age/gender General Gait Details: Impulsive with poor control of RW at times. Min assist to slow patient and bring RW within base of support. Able to do several times with verbal cues for walker placement however continues to move RW out of safe base of support. No buckling noted from LEs. Balance and control improve with VC to maintain slow bace and ambulate in straight line. SpO2 down to 88% while ambulating on room air.  Stairs            Wheelchair Mobility    Modified Rankin (Stroke Patients Only)       Balance Overall balance assessment: Needs assistance Sitting-balance support: No upper extremity supported;Feet supported Sitting balance-Leahy Scale: Good     Standing balance support: No upper extremity supported Standing balance-Leahy Scale: Fair                               Pertinent Vitals/Pain Pain Assessment: No/denies pain    Home Living Family/patient expects to be discharged to:: Private residence Living Arrangements:  (Previous note states pt is caregiver for wife)   Type of Home: Mobile home Home Access: Stairs to enter Entrance Stairs-Rails: Psychiatric nurse of Steps: 7 Home Layout: One level Home Equipment: Environmental consultant - 2 wheels;Tub bench Additional Comments: Pt poor historian. Some information pulled from previous notes.    Prior Function Level of Independence: Independent  Comments: Assume pt was independent if able to care for wife which was mentioned in a previous note.     Hand Dominance   Dominant Hand: Left    Extremity/Trunk Assessment   Upper Extremity Assessment: Defer to OT evaluation           Lower Extremity Assessment: Overall WFL for tasks assessed          Communication   Communication: HOH  Cognition Arousal/Alertness: Awake/alert Behavior During Therapy: Restless;Impulsive Overall Cognitive Status: Impaired/Different from baseline Area of Impairment: Orientation;Following commands;Problem solving;Safety/judgement Orientation Level: Disoriented to;Place;Time;Situation     Following Commands: Follows one step commands inconsistently;Follows one step commands with increased time Safety/Judgement: Decreased awareness of safety;Decreased awareness of deficits   Problem Solving: Slow processing;Requires verbal cues;Difficulty sequencing      General Comments General comments (skin integrity, edema, etc.): SpO2 93% at rest on room air, down to 88% after ambulation. Returns to low 90s with cues for pursed lip breathing and rest break.    Exercises        Assessment/Plan    PT Assessment Patient needs continued PT services  PT Diagnosis Difficulty walking   PT Problem List Decreased mobility;Decreased safety awareness;Decreased knowledge of use of DME;Decreased balance;Decreased activity tolerance;Decreased coordination;Decreased cognition;Cardiopulmonary status limiting activity  PT Treatment Interventions DME instruction;Gait training;Stair training;Functional mobility training;Therapeutic activities;Therapeutic exercise;Patient/family education;Balance training;Cognitive remediation   PT Goals (Current goals can be found in the Care Plan section) Acute Rehab PT Goals Patient Stated Goal: none stated PT Goal Formulation: With patient Time For Goal Achievement: 01/31/15 Potential to Achieve Goals: Good    Frequency Min 3X/week   Barriers to discharge Decreased caregiver support From previous admission. pt is primary caregiver for wife.    Co-evaluation               End of Session Equipment Utilized During Treatment: Gait belt Activity Tolerance: Patient tolerated treatment well Patient left: in chair;with call  bell/phone within reach;with nursing/sitter in room Nurse Communication: Mobility status         Time: [id number] PT Time Calculation (min) (ACUTE ONLY): 16 min   Charges:   PT Evaluation $Initial PT Evaluation Tier I: 1 Procedure     PT G CodesEllouise Newer 01/17/2015, 5:43 PM  Camille Bal [place], [place]

## 2015-01-17 NOTE — Progress Notes (Signed)
PT Cancellation Note  Patient Details Name: Harold Martin MRN: [id number] DOB: [birth year]   Cancelled Treatment:    Reason Eval/Treat Not Completed: Patient at procedure or test/unavailable Pt having in-room procedure performed at this time. Will follow up as time allows for physical therapy evaluation.  Ellouise Newer 01/17/2015, 4:17 PM Camille Bal [place], [place]

## 2015-01-17 NOTE — Progress Notes (Signed)
[facility] PROGRESS NOTE  Harold Martin [id number] DOB: 11-12-1940 DOA: 01/16/2015 PCP: Dwan Bolt, MD  Assessment/Plan:  Principal Problem:   Acute encephalopathy:  Etiology not clear. Could be somewhat related to volume depletion and acute renal failure, but seems out of proportion to both. Ammonia level normal.  Family reports no history of alcohol use or withdrawal. No history of chronic benzodiazepines. CAT scan brain negative. Infectious workup negative. Will get EEG and MRI. Daughter reports that patient had a similar presentation which was actually worse several months ago at [facility] and had a "mini stroke". Will obtain records. Needs IV fluids.  B-12, TSH normal. Also has asterixis. Family reports that patient has a tremor at baseline which has worsened recently. They report he has no memory problems at baseline. Patient is impulsive and taken off telemetry leads. Will DC. Ripped out IV 3 times last night. We'll give a dose of Ativan and give a bolus of fluids.  Active Problems:   Acute renal failure superimposed on stage 3 chronic kidney disease   Type 2 diabetes mellitus with hyperglycemia:  stable    Essential hypertension stable    Normocytic anemia chronic: No signs of bleeding. Monitor.   chronic diarrhea: Has been worked up extensively in the past. Celiac workup negative. C. difficile negative. Will continue Lomotil.   Code Status:  full Family Communication:  multiple at bedside  Disposition Plan:    HPI/Subjective:  patient has no complaints. Family reports that he remains confused but slightly improved from yesterday otherwise see above.   Objective: Filed Vitals:   01/17/15 1300  BP: 118/54  Pulse: 87  Temp: 99.1 F (37.3 C)  Resp: 20    Intake/Output Summary (Last 24 hours) at 01/17/15 1543 Last data filed at 01/17/15 1202  Gross per 24 hour  Intake    240 ml  Output      0 ml  Net    240 ml   There were no vitals filed for  this visit.  Exam:   General:  In chair. Somewhat fidgety. Able to answer questions. Disoriented to person only. Fairly cooperative.   Cardiovascular: regular rate rhythm without murmurs gallops rubs   Respiratory:  clear to auscultation bilaterally without wheezes rhonchi or rales   Abdomen:  soft nontender nondistended   Ext: No clubbing cyanosis or edema  Neurologic: Asterixis noted. Myoclonic jerks. No focal deficits.  Basic Metabolic Panel:  Recent Labs Lab 01/16/15 1510 01/16/15 1519 01/17/15 0515  NA 136 136 137  K 5.0 4.9 4.7  CL 97* 97* 98*  CO2 32  --  31  GLUCOSE 108* 108* 129*  BUN 24* 26* 24*  CREATININE 2.32* 2.20* 2.13*  CALCIUM 7.9*  --  7.8*  MG  --   --  5.0*   Liver Function Tests:  Recent Labs Lab 01/16/15 1510 01/17/15 0515  AST 18 18  ALT 18 17  ALKPHOS 92 81  BILITOT 0.6 0.4  PROT 6.4* 6.2*  ALBUMIN 3.5 3.3*   No results for input(s): LIPASE, AMYLASE in the last 168 hours.  Recent Labs Lab 01/16/15 2046  AMMONIA 12   CBC:  Recent Labs Lab 01/16/15 1510 01/16/15 1519 01/17/15 0515  WBC 8.5  --  9.5  NEUTROABS 6.7  --   --   HGB 9.0* 10.2* 8.9*  HCT 30.5* 30.0* 29.9*  MCV 83.3  --  85.2  PLT 278  --  266   Cardiac Enzymes: No results for input(s):  CKTOTAL, CKMB, CKMBINDEX, TROPONINI in the last 168 hours. BNP (last 3 results) No results for input(s): BNP in the last 8760 hours.  ProBNP (last 3 results) No results for input(s): PROBNP in the last 8760 hours.  CBG:  Recent Labs Lab 01/16/15 1541 01/16/15 2126 01/17/15 0629 01/17/15 1108  GLUCAP 98 181* 158* 125*    Recent Results (from the past 240 hour(s))  Clostridium Difficile by PCR (not at [facility])     Status: None   Collection Time: 01/17/15 10:00 AM  Result Value Ref Range Status   C difficile by pcr NEGATIVE NEGATIVE Final     Studies: Dg Chest 2 View  01/16/2015   CLINICAL DATA:  Altered mental status for 1 day  EXAM: CHEST - 2 VIEW  COMPARISON:   09/09/14  FINDINGS: Cardiac shadow is stable. The lungs are clear bilaterally. No focal infiltrate is seen. No acute bony abnormality is noted.  IMPRESSION: No active disease.   Electronically Signed   By: Inez Catalina M.D.   On: 01/16/2015 16:43   Ct Head Wo Contrast  01/16/2015   CLINICAL DATA:  Initial encounter for confusion and weakness since 6 a.m. this morning.  EXAM: CT HEAD WITHOUT CONTRAST  TECHNIQUE: Contiguous axial images were obtained from the base of the skull through the vertex without intravenous contrast.  COMPARISON:  Brain MRI from 09/11/2014.  Head CT from 09/09/2014.  FINDINGS: There is no evidence for acute hemorrhage, hydrocephalus, mass lesion, or abnormal extra-axial fluid collection. No definite CT evidence for acute infarction. Diffuse loss of parenchymal volume is consistent with atrophy. Patchy low attenuation in the deep hemispheric and periventricular white matter is nonspecific, but likely reflects chronic microvascular ischemic demyelination. The visualized paranasal sinuses and mastoid air cells are clear.  IMPRESSION: Stable. Atrophy with mild chronic small vessel white matter disease. No acute intracranial abnormality.   Electronically Signed   By: Misty Stanley M.D.   On: 01/16/2015 16:21    Scheduled Meds: . enoxaparin (LOVENOX) injection  40 mg Subcutaneous Q24H  . insulin aspart  0-5 Units Subcutaneous QHS  . insulin aspart  0-9 Units Subcutaneous TID WC  . levothyroxine  150 mcg Oral QAC breakfast  . LORazepam  1 mg Intravenous Once  . magnesium oxide  400 mg Oral Daily  . pantoprazole  80 mg Oral Daily  . sodium chloride  1,000 mL Intravenous Once  . sodium chloride  3 mL Intravenous Q12H   Continuous Infusions: . sodium chloride 0.9 % 1,000 mL infusion 75 mL/hr at 01/16/15 2014    Time spent: 35 minutes  [place] [facility] [email or web address], password [facility] 01/17/2015, 3:43 PM  LOS: 1 day    Addendum: Patient has become increasingly  agitated despite Ativan. Now has IV. Will give a dose of Haldol. Trying to pull out wires and tubes. Temporary physical restraints.   Doree Barthel, MD

## 2015-01-17 NOTE — Progress Notes (Signed)
EEG completed; results pending.    

## 2015-01-17 NOTE — Procedures (Signed)
ELECTROENCEPHALOGRAM REPORT   Patient: Harold Martin      Room #: [id number] Age: 73 y.o.        Sex: male Referring Physician: Dr Conley Canal Report Date:  01/17/2015        Interpreting Physician: Hulen Luster  History: Harold Martin is an 74 y.o. male admitted with altered mental status  Medications:  Scheduled: . diphenoxylate-atropine  1 tablet Oral BID  . enoxaparin (LOVENOX) injection  40 mg Subcutaneous Q24H  . insulin aspart  0-5 Units Subcutaneous QHS  . insulin aspart  0-9 Units Subcutaneous TID WC  . levothyroxine  150 mcg Oral QAC breakfast  . LORazepam  1 mg Intravenous Once  . magnesium oxide  400 mg Oral Daily  . pantoprazole  80 mg Oral Daily  . sodium chloride  3 mL Intravenous Q12H  . [START ON 01/18/2015] tamsulosin  0.4 mg Oral Q1200    Conditions of Recording:  This is a 16 channel EEG carried out with the patient in the drowsy state.  Description:  The waking background activity consists of a low voltage, symmetrical, poorly organized theta activity. No posterior dominant alpha rhythm is noted. No focal slowing or epileptiform activity is noted.  Normal sleep architecture is not observed.  Hyperventilation was not performed. Intermittent photic stimulation was performed but failed to illicit any change in the tracing.   IMPRESSION: This is an abnormal EEG secondary to general background slowing.  This finding may be seen with a diffuse disturbance that is etiologically nonspecific, but may include a metabolic encephalopathy, among other possibilities.  No epileptiform activity was noted.     Jim Like, DO [facility] [phone]  If 7pm- 7am, please page neurology on call as listed in [place]. 01/17/2015, 5:29 PM

## 2015-01-17 NOTE — Progress Notes (Signed)
Patient became more restless, fidgetty and impulsive after Ativan given. MD made aware and restraints order carried out.

## 2015-01-18 ENCOUNTER — Encounter (HOSPITAL_COMMUNITY): Payer: Self-pay | Admitting: Internal Medicine

## 2015-01-18 ENCOUNTER — Inpatient Hospital Stay (HOSPITAL_COMMUNITY): Payer: Medicare Other

## 2015-01-18 DIAGNOSIS — J9602 Acute respiratory failure with hypercapnia: Secondary | ICD-10-CM | POA: Diagnosis present

## 2015-01-18 DIAGNOSIS — J441 Chronic obstructive pulmonary disease with (acute) exacerbation: Secondary | ICD-10-CM | POA: Diagnosis present

## 2015-01-18 LAB — GLUCOSE, CAPILLARY
GLUCOSE-CAPILLARY: 160 mg/dL — AB (ref 65–99)
Glucose-Capillary: 159 mg/dL — ABNORMAL HIGH (ref 65–99)
Glucose-Capillary: 182 mg/dL — ABNORMAL HIGH (ref 65–99)

## 2015-01-18 LAB — BASIC METABOLIC PANEL
ANION GAP: 8 (ref 5–15)
BUN: 23 mg/dL — ABNORMAL HIGH (ref 6–20)
CO2: 29 mmol/L (ref 22–32)
CREATININE: 1.9 mg/dL — AB (ref 0.61–1.24)
Calcium: 7 mg/dL — ABNORMAL LOW (ref 8.9–10.3)
Chloride: 98 mmol/L — ABNORMAL LOW (ref 101–111)
GFR calc Af Amer: 39 mL/min — ABNORMAL LOW (ref >60)
GFR, EST NON AFRICAN AMERICAN: 34 mL/min — AB (ref >60)
Glucose, Bld: 162 mg/dL — ABNORMAL HIGH (ref 65–99)
Potassium: 4.5 mmol/L (ref 3.5–5.1)
SODIUM: 135 mmol/L (ref 135–145)

## 2015-01-18 LAB — BLOOD GAS, ARTERIAL
ACID-BASE EXCESS: 1.4 mmol/L (ref 0.0–2.0)
Acid-Base Excess: 2.2 mmol/L — ABNORMAL HIGH (ref 0.0–2.0)
BICARBONATE: 27.5 meq/L — AB (ref 20.0–24.0)
Bicarbonate: 27.5 mEq/L — ABNORMAL HIGH (ref 20.0–24.0)
DRAWN BY: 313061
Drawn by: 331001
O2 CONTENT: 3 L/min
O2 Content: 3 L/min
O2 SAT: 90.3 %
O2 SAT: 93 %
PATIENT TEMPERATURE: 98.1
PH ART: 7.285 — AB (ref 7.350–7.450)
PH ART: 7.335 — AB (ref 7.350–7.450)
PO2 ART: 65.1 mmHg — AB (ref 80.0–100.0)
PO2 ART: 65 mmHg — AB (ref 80.0–100.0)
Patient temperature: 98.6
TCO2: 29.3 mmol/L (ref 0–100)
TCO2: 29.1 mmol/L (ref 0–100)
pCO2 arterial: 59.5 mmHg (ref 35.0–45.0)
pCO2 arterial: 53 mmHg — ABNORMAL HIGH (ref 35.0–45.0)

## 2015-01-18 LAB — CBC
HCT: 28.5 % — ABNORMAL LOW (ref 39.0–52.0)
Hemoglobin: 8.3 g/dL — ABNORMAL LOW (ref 13.0–17.0)
MCH: 24.7 pg — ABNORMAL LOW (ref 26.0–34.0)
MCHC: 29.1 g/dL — ABNORMAL LOW (ref 30.0–36.0)
MCV: 84.8 fL (ref 78.0–100.0)
PLATELETS: 254 10*3/uL (ref 150–400)
RBC: 3.36 MIL/uL — AB (ref 4.22–5.81)
RDW: 15.8 % — ABNORMAL HIGH (ref 11.5–15.5)
WBC: 7.4 10*3/uL (ref 4.0–10.5)

## 2015-01-18 LAB — HEMOGLOBIN A1C
HEMOGLOBIN A1C: 8.3 % — AB (ref 4.8–5.6)
MEAN PLASMA GLUCOSE: 192 mg/dL

## 2015-01-18 LAB — BRAIN NATRIURETIC PEPTIDE: B Natriuretic Peptide: 200.6 pg/mL — ABNORMAL HIGH (ref 0.0–100.0)

## 2015-01-18 MED ORDER — ALBUTEROL SULFATE (2.5 MG/3ML) 0.083% IN NEBU: 2.5000 mg | INHALATION_SOLUTION | Freq: Four times a day (QID) | RESPIRATORY_TRACT | Status: DC

## 2015-01-18 MED ORDER — FUROSEMIDE 10 MG/ML IJ SOLN
40.0000 mg | Freq: Once | INTRAMUSCULAR | Status: AC
  Administered 2015-01-18: 40 mg via INTRAVENOUS
  Filled 2015-01-18: qty 4

## 2015-01-18 MED ORDER — HALOPERIDOL LACTATE 5 MG/ML IJ SOLN: 1.0000 mg | Freq: Two times a day (BID) | INTRAMUSCULAR | Status: DC | PRN

## 2015-01-18 MED ORDER — IPRATROPIUM-ALBUTEROL 0.5-2.5 (3) MG/3ML IN SOLN
3.0000 mL | Freq: Four times a day (QID) | RESPIRATORY_TRACT | Status: DC
  Administered 2015-01-18 – 2015-01-19 (×4): 3 mL via RESPIRATORY_TRACT
  Filled 2015-01-18 (×4): qty 3

## 2015-01-18 MED ORDER — METHYLPREDNISOLONE SODIUM SUCC 125 MG IJ SOLR
125.0000 mg | Freq: Once | INTRAMUSCULAR | Status: AC
  Administered 2015-01-18: 125 mg via INTRAVENOUS
  Filled 2015-01-18: qty 2

## 2015-01-18 MED ORDER — IPRATROPIUM-ALBUTEROL 0.5-2.5 (3) MG/3ML IN SOLN: 3.0000 mL | Freq: Three times a day (TID) | RESPIRATORY_TRACT | Status: DC

## 2015-01-18 NOTE — Progress Notes (Signed)
[facility] PROGRESS NOTE  Harold Martin [id number] DOB: Sep 02, 1940 DOA: 01/16/2015 PCP: Dwan Bolt, MD  Assessment/Plan:  Principal Problem:   Acute encephalopathy:  Etiology not clear. Could be somewhat related to volume depletion and acute renal failure, but seems out of proportion to both. Ammonia level normal.  Family reports no history of alcohol use or withdrawal. No history of chronic benzodiazepines. CAT scan brain negative. Infectious workup negative. EEG without epileptiform activity. MRI pending. Daughter reports that patient had a similar presentation which was actually worse several months ago at [facility] and had a "mini stroke". await records. continue IV fluids.  B-12, TSH normal. Also has asterixis. Family reports that patient has a tremor at baseline which has worsened recently. Movement disorder with acute delirium? They report he has no memory problems at baseline. Received ativan and haldol last night. Will follow serial exams. Try out of restraints. Check ABG r/o CO2 retention Active Problems:   Acute renal failure superimposed on stage 3 chronic kidney disease secondary to diarrhea   Type 2 diabetes mellitus with hyperglycemia:  stable    Essential hypertension stable    Normocytic anemia chronic: No signs of bleeding. Monitor.   chronic diarrhea: Has been worked up extensively in the past. Celiac workup negative. C. difficile negative. Will continue Lomotil.  Reported difficulty breathing last night: check CXR and BNP.   Code Status:  full Family Communication:  multiple at bedside 7/13 Disposition Plan:    HPI/Subjective: Overnight, remained in restraints. Required NT suction  For "gurgly respirations". Pt denies dyspnea.  Objective: Filed Vitals:   01/18/15 0648  BP: 139/80  Pulse: 94  Temp: 98.1 F (36.7 C)  Resp: 20    Intake/Output Summary (Last 24 hours) at 01/18/15 0842 Last data filed at 01/17/15 1715  Gross per 24  hour  Intake    240 ml  Output      0 ml  Net    240 ml   Filed Weights   01/17/15 1400  Weight: 106.323 kg (234 lb 6.4 oz)    Exam:   General:  In 4 point restraints. Asleep. Arousable. Falls quickly back asleep.  Calm.   Cardiovascular: regular rate rhythm without murmurs gallops rubs   Respiratory:  clear to auscultation bilaterally without wheezes rhonchi or rales   Abdomen:  soft nontender nondistended   Ext: No clubbing cyanosis or edema  Neurologic: no focal defecits  Basic Metabolic Panel:  Recent Labs Lab 01/16/15 1510 01/16/15 1519 01/17/15 0515 01/18/15 0525  NA 136 136 137 135  K 5.0 4.9 4.7 4.5  CL 97* 97* 98* 98*  CO2 32  --  31 29  GLUCOSE 108* 108* 129* 162*  BUN 24* 26* 24* 23*  CREATININE 2.32* 2.20* 2.13* 1.90*  CALCIUM 7.9*  --  7.8* 7.0*  MG  --   --  5.0*  --    Liver Function Tests:  Recent Labs Lab 01/16/15 1510 01/17/15 0515  AST 18 18  ALT 18 17  ALKPHOS 92 81  BILITOT 0.6 0.4  PROT 6.4* 6.2*  ALBUMIN 3.5 3.3*   No results for input(s): LIPASE, AMYLASE in the last 168 hours.  Recent Labs Lab 01/16/15 2046  AMMONIA 12   CBC:  Recent Labs Lab 01/16/15 1510 01/16/15 1519 01/17/15 0515 01/18/15 0525  WBC 8.5  --  9.5 7.4  NEUTROABS 6.7  --   --   --   HGB 9.0* 10.2* 8.9* 8.3*  HCT 30.5* 30.0*  29.9* 28.5*  MCV 83.3  --  85.2 84.8  PLT 278  --  266 254   Cardiac Enzymes: No results for input(s): CKTOTAL, CKMB, CKMBINDEX, TROPONINI in the last 168 hours. BNP (last 3 results) No results for input(s): BNP in the last 8760 hours.  ProBNP (last 3 results) No results for input(s): PROBNP in the last 8760 hours.  CBG:  Recent Labs Lab 01/17/15 0629 01/17/15 1108 01/17/15 1648 01/17/15 2211 01/18/15 0633  GLUCAP 158* 125* 147* 217* 159*    Recent Results (from the past 240 hour(s))  Clostridium Difficile by PCR (not at [facility])     Status: None   Collection Time: 01/17/15 10:00 AM  Result Value Ref Range  Status   C difficile by pcr NEGATIVE NEGATIVE Final     Studies: Dg Chest 2 View  01/16/2015   CLINICAL DATA:  Altered mental status for 1 day  EXAM: CHEST - 2 VIEW  COMPARISON:  09/09/14  FINDINGS: Cardiac shadow is stable. The lungs are clear bilaterally. No focal infiltrate is seen. No acute bony abnormality is noted.  IMPRESSION: No active disease.   Electronically Signed   By: Inez Catalina M.D.   On: 01/16/2015 16:43   Ct Head Wo Contrast  01/16/2015   CLINICAL DATA:  Initial encounter for confusion and weakness since 6 a.m. this morning.  EXAM: CT HEAD WITHOUT CONTRAST  TECHNIQUE: Contiguous axial images were obtained from the base of the skull through the vertex without intravenous contrast.  COMPARISON:  Brain MRI from 09/11/2014.  Head CT from 09/09/2014.  FINDINGS: There is no evidence for acute hemorrhage, hydrocephalus, mass lesion, or abnormal extra-axial fluid collection. No definite CT evidence for acute infarction. Diffuse loss of parenchymal volume is consistent with atrophy. Patchy low attenuation in the deep hemispheric and periventricular white matter is nonspecific, but likely reflects chronic microvascular ischemic demyelination. The visualized paranasal sinuses and mastoid air cells are clear.  IMPRESSION: Stable. Atrophy with mild chronic small vessel white matter disease. No acute intracranial abnormality.   Electronically Signed   By: Misty Stanley M.D.   On: 01/16/2015 16:21    Scheduled Meds: . diphenoxylate-atropine  1 tablet Oral BID  . enoxaparin (LOVENOX) injection  40 mg Subcutaneous Q24H  . haloperidol lactate  5 mg Intravenous Once  . insulin aspart  0-5 Units Subcutaneous QHS  . insulin aspart  0-9 Units Subcutaneous TID WC  . levothyroxine  150 mcg Oral QAC breakfast  . magnesium oxide  400 mg Oral Daily  . pantoprazole  80 mg Oral Daily  . sodium chloride  3 mL Intravenous Q12H  . tamsulosin  0.4 mg Oral Q1200   Continuous Infusions: . sodium chloride 100  mL/hr at 01/17/15 1727  . sodium chloride 0.9 % 1,000 mL infusion 75 mL/hr at 01/16/15 2014    Time spent: 25 minutes  [place] [facility] [email or web address], password [facility] 01/18/2015, 8:42 AM  LOS: 2 days

## 2015-01-18 NOTE — Progress Notes (Signed)
Patient is transferred from room 4N17 to unit 2C at this time. Alert but very restless and impulsive. Report given to receiving nurse Shelda Pal. Transported via bed with grand daughter and all belongings at side.

## 2015-01-18 NOTE — Progress Notes (Signed)
Received patient from [facility];woke up combative. Transferred to SDU for closer monitoring; repeat ABG's and may need intubation. Pt. Awake; confused; restless. bil mittens intact. Sitter and granddaughter at bedside.

## 2015-01-18 NOTE — Progress Notes (Signed)
Patient NT suctioned. Went down the Left Nostril. Patient tolerated well. Sats improved from 91% to 96%

## 2015-01-18 NOTE — Progress Notes (Signed)
Patient becoming increasingly gurgly, notified Baltazar Najjar NP. Orders received. Will continue to monitor.

## 2015-01-18 NOTE — Progress Notes (Signed)
PT Cancellation Note  Patient Details Name: Harold Martin MRN: [id number] DOB: 1941-04-02   Cancelled Treatment:    Reason Eval/Treat Not Completed: Medical issues which prohibited therapy Patient currently having respiratory difficulties. Possibly transferring to stepdown. Will hold physical therapy at this time.  Ellouise Newer 01/18/2015, 2:25 PM Camille Bal [place], [place]

## 2015-01-18 NOTE — Progress Notes (Signed)
Called by respiratory therapy about respiratory acidosis.  ABG    Component Value Date/Time   PHART 7.285* 01/18/2015 0914   PCO2ART 59.5* 01/18/2015 0914   PO2ART 65.1* 01/18/2015 0914   HCO3 27.5* 01/18/2015 0914   TCO2 29.3 01/18/2015 0914   O2SAT 90.3 01/18/2015 0914   Patient has a previous history of smoking. Obese. Suspect lung disease and possibly obstructive sleep apnea. Also may be medication effect from Haldol last night. Chest x-ray without anything acute. Ordered nebulizers. Initially, patient very somnolent. See Pap ordered and patient woke up and became combative. Unable to cooperate. Will transfer to stepdown unit for closer monitoring. Repeat ABG. If worse might need intubation. Trying to expedite records from [facility]. [place] give a dose of steroid-induced and Lasix. Updated family. As workup for confusion has to date then negative other than acute renal failure, I wonder whether hypercarbia may have been present on admission and explain patient's confusion and asterixis. Echocardiogram ordered.  Try to avoid sedatives if at all possible except in cases of extreme combativeness.   Critical care time 30 minutes  Doree Barthel, MD

## 2015-01-18 NOTE — Care Management Important Message (Signed)
Important Message  Patient Details  Name: Harold Martin MRN: [id number] Date of Birth: March 17, 1941   Medicare Important Message Given:  Yes-second notification given    Delorse Lek 01/18/2015, 1:51 PM

## 2015-01-18 NOTE — Progress Notes (Signed)
Placed pt on CPAP per order.  Pt placed on FFM at [facility] and is tolerating well with 3lpm O2 bled-in.  Family at bedside encouraged to remind him that while the mask is on he can't eat or drink to avoid aspiration.

## 2015-01-19 ENCOUNTER — Inpatient Hospital Stay (HOSPITAL_COMMUNITY): Payer: Medicare Other

## 2015-01-19 DIAGNOSIS — M79606 Pain in leg, unspecified: Secondary | ICD-10-CM

## 2015-01-19 LAB — BASIC METABOLIC PANEL
ANION GAP: 6 (ref 5–15)
BUN: 20 mg/dL (ref 6–20)
CALCIUM: 7.6 mg/dL — AB (ref 8.9–10.3)
CO2: 29 mmol/L (ref 22–32)
Chloride: 101 mmol/L (ref 101–111)
Creatinine, Ser: 1.42 mg/dL — ABNORMAL HIGH (ref 0.61–1.24)
GFR calc Af Amer: 55 mL/min — ABNORMAL LOW (ref >60)
GFR calc non Af Amer: 48 mL/min — ABNORMAL LOW (ref >60)
Glucose, Bld: 200 mg/dL — ABNORMAL HIGH (ref 65–99)
Potassium: 4.7 mmol/L (ref 3.5–5.1)
SODIUM: 136 mmol/L (ref 135–145)

## 2015-01-19 LAB — URINE CULTURE

## 2015-01-19 LAB — BLOOD GAS, ARTERIAL
Acid-Base Excess: 3.9 mmol/L — ABNORMAL HIGH (ref 0.0–2.0)
BICARBONATE: 28 meq/L — AB (ref 20.0–24.0)
Drawn by: 277331
O2 Content: 3 L/min
O2 SAT: 92.7 %
PH ART: 7.426 (ref 7.350–7.450)
Patient temperature: 99.2
TCO2: 29.3 mmol/L (ref 0–100)
pCO2 arterial: 43.6 mmHg (ref 35.0–45.0)
pO2, Arterial: 64.9 mmHg — ABNORMAL LOW (ref 80.0–100.0)

## 2015-01-19 LAB — FOLATE RBC
Folate, Hemolysate: 429 ng/mL
Folate, RBC: 1607 ng/mL (ref >498)
HEMATOCRIT: 26.7 % — AB (ref 37.5–51.0)

## 2015-01-19 LAB — GLUCOSE, CAPILLARY
GLUCOSE-CAPILLARY: 180 mg/dL — AB (ref 65–99)
Glucose-Capillary: 220 mg/dL — ABNORMAL HIGH (ref 65–99)
Glucose-Capillary: 153 mg/dL — ABNORMAL HIGH (ref 65–99)
Glucose-Capillary: 173 mg/dL — ABNORMAL HIGH (ref 65–99)
Glucose-Capillary: 156 mg/dL — ABNORMAL HIGH (ref 65–99)

## 2015-01-19 MED ORDER — DEXTROSE 5 % IV SOLN
500.0000 mg | INTRAVENOUS | Status: DC
  Administered 2015-01-19 – 2015-01-24 (×6): 500 mg via INTRAVENOUS
  Filled 2015-01-19 (×7): qty 500

## 2015-01-19 MED ORDER — CEFTRIAXONE SODIUM IN DEXTROSE 20 MG/ML IV SOLN
1.0000 g | INTRAVENOUS | Status: DC
  Administered 2015-01-19 – 2015-01-24 (×6): 1 g via INTRAVENOUS
  Filled 2015-01-19 (×7): qty 50

## 2015-01-19 MED ORDER — IPRATROPIUM-ALBUTEROL 0.5-2.5 (3) MG/3ML IN SOLN: 3.0000 mL | RESPIRATORY_TRACT | Status: DC | PRN

## 2015-01-19 NOTE — Clinical Social Work Note (Signed)
Clinical Social Work Assessment  Patient Details  Name: Harold Martin MRN: [id number] Date of Birth: [birth year]  Date of referral:  01/18/15               Reason for consult:  Facility Placement                Housing/Transportation Living arrangements for the past 2 months:  Single Family Home Source of Information:  Adult Children ([name],[name]) Patient Interpreter Needed:  None Criminal Activity/Legal Involvement Pertinent to Current Situation/Hospitalization:  No - Comment as needed Significant Relationships:  Adult Children, Spouse Lives with:  Adult Children Do you feel safe going back to the place where you live?  No Need for family participation in patient care:  Yes (Comment)  Care giving concerns:  N/A   Facilities manager / plan: CSW spoke with the pt's daughter [name]. CSW introduced self and purpose of the visit. CSW discussed SNF rehab. CSW explained the SNF process. CSW explained insurance and its relation to SNF placement. CSW and [name] discussed geographic location in which she would like the pt to receive rehab. [name] shared that she would like the pt to go to [place] because his wife is there. CSW contacted Fransisco Beau at [facility]. Fransisco Beau accepted the pt. CSW answered all questions in which the [place] inquired about. CSW will continue to follow this pt and assist with discharge as needed.   Employment status:  Retired Nurse, adult PT Recommendations:  [place] / Referral to community resources:  [place]  Patient/Family's Response to care:  [name] reported that the care in which the pt has received has been great.   Patient/Family's Understanding of and Emotional Response to Diagnosis, Current Treatment, and Prognosis: [name] acknowledged the pt's decline in health. [name] acknowledged that given that she work full-time and has two small  children she will not be able to care for the pt 24 hours.   Emotional Assessment Appearance:   (Unable to Assess) Attitude/Demeanor/Rapport:  Unable to Assess Affect (typically observed):  Unable to Assess Orientation:   (Disoriented ) Alcohol / Substance use:  Not Applicable Psych involvement (Current and /or in the community):  No (Comment)  Discharge Needs  Concerns to be addressed:  Denies Needs/Concerns at this time Readmission within the last 30 days:  No Current discharge risk:  None Barriers to Discharge:  No Barriers Identified   Alin Hutchins, LCSW 01/19/2015, 8:26 AM

## 2015-01-19 NOTE — Progress Notes (Signed)
Inpatient Diabetes Program Recommendations  AACE/ADA: New Consensus Statement on Inpatient Glycemic Control (2013)  Target Ranges:  Prepandial:   less than 140 mg/dL      Peak postprandial:   less than 180 mg/dL (1-2 hours)      Critically ill patients:  140 - 180 mg/dL   Results for BASSEL, RUDISILL (MRN [id number]) as of 01/19/2015 07:55  Ref. Range 01/18/2015 06:33 01/18/2015 12:06 01/18/2015 16:10 01/18/2015 21:54  Glucose-Capillary Latest Ref Range: 65-99 mg/dL 159 (H) 182 (H) 160 (H) 220 (H)   Reason for Admitted: Acute encephalopathy  Diabetes history: DM 2 Outpatient Diabetes medications: Glipizide 10 mg Daily, Januvia 100 mg Daily at 12 pm Current orders for Inpatient glycemic control: Novolog Sensitive scale + HS scale  Inpatient Diabetes Program Recommendations Correction (SSI): Glucose 159 mg/dl and above into the 200's. Please consider increasing correction to Novolog Moderate scale (0-15 units) TID.  Thanks,  Tama Headings RN, MSN, [facility] Inpatient Diabetes Coordinator Team Pager [phone]

## 2015-01-19 NOTE — Progress Notes (Signed)
*  Preliminary Results* Bilateral lower extremity venous duplex completed. Bilateral lower extremities are negative for deep vein thrombosis. There is no evidence of Baker's cyst bilaterally.  01/19/2015  Maudry Mayhew, RVT, RDCS, RDMS

## 2015-01-19 NOTE — Progress Notes (Signed)
oob to chair.

## 2015-01-19 NOTE — Clinical Social Work Placement (Signed)
   CLINICAL SOCIAL WORK PLACEMENT  NOTE  Date:  01/19/2015  Patient Details  Name: Harold Martin MRN: [id number] Date of Birth: 1940-09-09  Clinical Social Work is seeking post-discharge placement for this patient at the [place] level of care (*CSW will initial, date and re-position this form in  chart as items are completed):  Yes   Patient/family provided with [place] Work Department's list of facilities offering this level of care within the geographic area requested by the patient (or if unable, by the patient's family).  Yes   Patient/family informed of their freedom to choose among providers that offer the needed level of care, that participate in Medicare, Medicaid or managed care program needed by the patient, have an available bed and are willing to accept the patient.  Yes   Patient/family informed of [place]'s ownership interest in [facility] and [facility], as well as of the fact that they are under no obligation to receive care at these facilities.  PASRR submitted to EDS on       PASRR number received on       Existing PASRR number confirmed on 01/19/15     FL2 transmitted to all facilities in geographic area requested by pt/family on 01/19/15     FL2 transmitted to all facilities within larger geographic area on       Patient informed that his/her managed care company has contracts with or will negotiate with certain facilities, including the following:        Yes   Patient/family informed of bed offers received.  Patient chooses bed at [facility] and [facility] recommends and patient chooses bed at      Patient to be transferred to [facility] on  .  Patient to be transferred to facility by       Patient family notified on   of transfer.  Name of family member notified:        PHYSICIAN       Additional Comment:    _______________________________________________ Greta Doom, LCSW 01/19/2015, 8:34 AM

## 2015-01-19 NOTE — Progress Notes (Signed)
[facility] PROGRESS NOTE  Harold Martin [id number] DOB: 1940-08-06 DOA: 01/16/2015 PCP: Dwan Bolt, MD  Assessment/Plan:  Principal Problem:   Acute encephalopathy:  Likely CO2 retention on admission.  Seems slightly better today. Will repeat ABG and continue SDU. Avoid sedatives as able except severe agitation Active Problems: Acute hypercarbic respiratory failure:  Secondary to COPD exacerbation. Second abg yesterday better. Will recheck.  Continue nebs. Avoid steroids as able due to above.  Continue SDU.  Coughing. Will add abx for acute bronchitis.  bnp only 200 and cxr ok, so doubt CHF, but echo pending.     Acute renal failure superimposed on stage 3 chronic kidney disease secondary to diarrhea. Nearly resolved   Type 2 diabetes mellitus with hyperglycemia:  stable    Essential hypertension stable    Normocytic anemia chronic: No signs of bleeding. Monitor.   chronic diarrhea: Has been worked up extensively in the past. Celiac workup negative. C. difficile negative. Will continue Lomotil.   Code Status:  full Family Communication:  7/14 Disposition Plan:  Likely SNF  HPI/Subjective: Reports some dyspnea, per rn, had a good night, less combattive  Objective: Filed Vitals:   01/19/15 0812  BP: 144/77  Pulse: 95  Temp: 99.2 F (37.3 C)  Resp: 23    Intake/Output Summary (Last 24 hours) at 01/19/15 0903 Last data filed at 01/19/15 0818  Gross per 24 hour  Intake  790.5 ml  Output   1100 ml  Net -309.5 ml   Filed Weights   01/17/15 1400  Weight: 106.323 kg (234 lb 6.4 oz)    Exam:   General:  Asleep. Arousable, but drowsy. Knows location  Cardiovascular: regular rate rhythm without murmurs gallops rubs   Respiratory:  Diminished throughout, but moving more air  Abdomen:  soft nontender nondistended   Ext: No clubbing cyanosis or edema  Neurologic: no focal defecits  Basic Metabolic Panel:  Recent Labs Lab 01/16/15 1510  01/16/15 1519 01/17/15 0515 01/18/15 0525 01/19/15 0335  NA 136 136 137 135 136  K 5.0 4.9 4.7 4.5 4.7  CL 97* 97* 98* 98* 101  CO2 32  --  31 29 29   GLUCOSE 108* 108* 129* 162* 200*  BUN 24* 26* 24* 23* 20  CREATININE 2.32* 2.20* 2.13* 1.90* 1.42*  CALCIUM 7.9*  --  7.8* 7.0* 7.6*  MG  --   --  5.0*  --   --    Liver Function Tests:  Recent Labs Lab 01/16/15 1510 01/17/15 0515  AST 18 18  ALT 18 17  ALKPHOS 92 81  BILITOT 0.6 0.4  PROT 6.4* 6.2*  ALBUMIN 3.5 3.3*   No results for input(s): LIPASE, AMYLASE in the last 168 hours.  Recent Labs Lab 01/16/15 2046  AMMONIA 12   CBC:  Recent Labs Lab 01/16/15 1510 01/16/15 1519 01/17/15 0515 01/18/15 0525  WBC 8.5  --  9.5 7.4  NEUTROABS 6.7  --   --   --   HGB 9.0* 10.2* 8.9* 8.3*  HCT 30.5* 30.0* 29.9* 28.5*  MCV 83.3  --  85.2 84.8  PLT 278  --  266 254   Cardiac Enzymes: No results for input(s): CKTOTAL, CKMB, CKMBINDEX, TROPONINI in the last 168 hours. BNP (last 3 results)  Recent Labs  01/18/15 1035  BNP 200.6*    ProBNP (last 3 results) No results for input(s): PROBNP in the last 8760 hours.  CBG:  Recent Labs Lab 01/18/15 [phone] 01/18/15 1206 01/18/15 1610 01/18/15  2154 01/19/15 0813  GLUCAP 159* 182* 160* 220* 153*    Recent Results (from the past 240 hour(s))  Clostridium Difficile by PCR (not at [facility])     Status: None   Collection Time: 01/17/15 10:00 AM  Result Value Ref Range Status   C difficile by pcr NEGATIVE NEGATIVE Final  Urine culture     Status: None (Preliminary result)   Collection Time: 01/17/15  1:50 PM  Result Value Ref Range Status   Specimen Description URINE, RANDOM  Final   Special Requests NONE  Final   Culture CULTURE REINCUBATED FOR BETTER GROWTH  Final   Report Status PENDING  Incomplete     Studies: Dg Chest Port 1 View  01/18/2015   CLINICAL DATA:  Cough and congestion.  EXAM: PORTABLE CHEST - 1 VIEW  COMPARISON:  None.  FINDINGS: Mediastinum and  hilar structures are normal. Cardiomegaly with normal pulmonary vascularity . Low lung volumes with mild right base subsegmental atelectasis. Mild infiltrate cannot be excluded. No pleural effusion or pneumothorax . No acute bony abnormality.  IMPRESSION: 1. Low lung volumes with mild right base subsegmental atelectasis and or infiltrate. 2. Cardiomegaly.  No pulmonary venous congestion .   Electronically Signed   By: Marcello Moores  Register   On: 01/18/2015 09:16    Scheduled Meds: . diphenoxylate-atropine  1 tablet Oral BID  . enoxaparin (LOVENOX) injection  40 mg Subcutaneous Q24H  . haloperidol lactate  5 mg Intravenous Once  . insulin aspart  0-5 Units Subcutaneous QHS  . insulin aspart  0-9 Units Subcutaneous TID WC  . ipratropium-albuterol  3 mL Nebulization QID  . levothyroxine  150 mcg Oral QAC breakfast  . magnesium oxide  400 mg Oral Daily  . pantoprazole  80 mg Oral Daily  . sodium chloride  3 mL Intravenous Q12H  . tamsulosin  0.4 mg Oral Q1200   Continuous Infusions: . sodium chloride 0.9 % 1,000 mL infusion 75 mL/hr at 01/16/15 2014    Time spent: 25 minutes  [facility] [email or web address], password [facility] 01/19/2015, 9:03 AM  LOS: 3 days

## 2015-01-19 NOTE — Progress Notes (Signed)
Nutrition Brief Note  Patient identified on the [place] Report  Wt Readings from Last 15 Encounters:  01/17/15 234 lb 6.4 oz (106.323 kg)  10/05/13 240 lb (108.863 kg)  09/23/13 244 lb (110.678 kg)  10/01/09 244 lb (110.678 kg)  09/07/09 249 lb (112.946 kg)  09/05/09 244 lb (110.54 kg)   74 year old male with a history of hypertension, diabetes mellitus, CKD stage II-III, hypothyroidism presents with one-day history of confusion. According to the patient's family at the bedside, the patient was his usual self on the night before admission. When the patient woke up on the morning of admission, the patient was noted to be confused. In addition, he's been unstable and his gait but has not fallen recently at home.   Pt admitted with acute encephalopathy.   Pt transferred to SDU due to respiratory difficulties. Pt was on a non-rebreather mask at time of visit; about to be taken down to vascular lab.   No signs of fat or muscle depletion noted.   Body mass index is 31.78 kg/(m^2). Patient meets criteria for obesity, class I based on current BMI.   Current diet order is carb modified, patient is consuming approximately 25-100% of meals at this time. Labs and medications reviewed.   No nutrition interventions warranted at this time. If nutrition issues arise, please consult RD.   Kenta Laster A. Jimmye Norman, RD, LDN, CDE Pager: [phone] After hours Pager: [phone]

## 2015-01-20 ENCOUNTER — Inpatient Hospital Stay (HOSPITAL_COMMUNITY): Payer: Medicare Other

## 2015-01-20 DIAGNOSIS — J9690 Respiratory failure, unspecified, unspecified whether with hypoxia or hypercapnia: Secondary | ICD-10-CM

## 2015-01-20 DIAGNOSIS — R1310 Dysphagia, unspecified: Secondary | ICD-10-CM

## 2015-01-20 LAB — BASIC METABOLIC PANEL
ANION GAP: 7 (ref 5–15)
BUN: 16 mg/dL (ref 6–20)
CO2: 27 mmol/L (ref 22–32)
CREATININE: 1.34 mg/dL — AB (ref 0.61–1.24)
Calcium: 7.8 mg/dL — ABNORMAL LOW (ref 8.9–10.3)
Chloride: 100 mmol/L — ABNORMAL LOW (ref 101–111)
GFR calc non Af Amer: 51 mL/min — ABNORMAL LOW (ref >60)
GFR, EST AFRICAN AMERICAN: 59 mL/min — AB (ref >60)
Glucose, Bld: 168 mg/dL — ABNORMAL HIGH (ref 65–99)
POTASSIUM: 4.6 mmol/L (ref 3.5–5.1)
SODIUM: 134 mmol/L — AB (ref 135–145)

## 2015-01-20 LAB — CSF CELL COUNT WITH DIFFERENTIAL
RBC Count, CSF: 0 /mm3
Tube #: 1
WBC, CSF: 1 /mm3 (ref 0–5)

## 2015-01-20 LAB — CBC
HCT: 30 % — ABNORMAL LOW (ref 39.0–52.0)
Hemoglobin: 9 g/dL — ABNORMAL LOW (ref 13.0–17.0)
MCH: 25.3 pg — ABNORMAL LOW (ref 26.0–34.0)
MCHC: 30 g/dL (ref 30.0–36.0)
MCV: 84.3 fL (ref 78.0–100.0)
Platelets: 241 10*3/uL (ref 150–400)
RBC: 3.56 MIL/uL — AB (ref 4.22–5.81)
RDW: 15.5 % (ref 11.5–15.5)
WBC: 8.8 10*3/uL (ref 4.0–10.5)

## 2015-01-20 LAB — GLUCOSE, CAPILLARY
GLUCOSE-CAPILLARY: 184 mg/dL — AB (ref 65–99)
GLUCOSE-CAPILLARY: 156 mg/dL — AB (ref 65–99)
Glucose-Capillary: 195 mg/dL — ABNORMAL HIGH (ref 65–99)
Glucose-Capillary: 183 mg/dL — ABNORMAL HIGH (ref 65–99)

## 2015-01-20 LAB — PROTEIN AND GLUCOSE, CSF
GLUCOSE CSF: 110 mg/dL — AB (ref 40–70)
Total  Protein, CSF: 33 mg/dL (ref 15–45)

## 2015-01-20 MED ORDER — IPRATROPIUM-ALBUTEROL 0.5-2.5 (3) MG/3ML IN SOLN
3.0000 mL | Freq: Four times a day (QID) | RESPIRATORY_TRACT | Status: DC
  Administered 2015-01-20 – 2015-01-21 (×4): 3 mL via RESPIRATORY_TRACT
  Filled 2015-01-20 (×4): qty 3

## 2015-01-20 MED ORDER — METHYLPREDNISOLONE SODIUM SUCC 125 MG IJ SOLR
60.0000 mg | Freq: Once | INTRAMUSCULAR | Status: AC
  Administered 2015-01-20: 60 mg via INTRAVENOUS
  Filled 2015-01-20: qty 2

## 2015-01-20 MED ORDER — METOPROLOL TARTRATE 1 MG/ML IV SOLN
5.0000 mg | Freq: Four times a day (QID) | INTRAVENOUS | Status: DC
  Administered 2015-01-20 – 2015-01-24 (×16): 5 mg via INTRAVENOUS
  Filled 2015-01-20 (×20): qty 5

## 2015-01-20 MED ORDER — LEVOTHYROXINE SODIUM 100 MCG IV SOLR
75.0000 ug | Freq: Every day | INTRAVENOUS | Status: DC
  Administered 2015-01-20 – 2015-01-23 (×4): 75 ug via INTRAVENOUS
  Filled 2015-01-20 (×6): qty 5

## 2015-01-20 NOTE — Progress Notes (Signed)
SLP Cancellation Note  Patient Details Name: ARO BARANOWSKI MRN: [id number] DOB: 09-Feb-1941   Cancelled treatment:       Reason Eval/Treat Not Completed: Patient at procedure or test/unavailable  ST will continue efforts to complete evaluation.     Lamar Sprinkles 01/20/2015, 10:22 AM Shelly Flatten, [place] SLP [phone]

## 2015-01-20 NOTE — Consult Note (Signed)
Neurology Consultation Reason for Consult: Altered Mental Status Referring Physician: Conley Canal, C  CC: Altered Mental Status  History is obtained from:chart, daughter  HPI: TREVOND TANGUMA is a 74 y.o. male who presented with altered mental status. This was initially attributed to hypercarbia and ARF. He had not been eating or drinking well prior to coming in.  Despite treatment of his medical problems, he has had no improvement in his mental status and therefore neurology has been consulted for further evaluation.   ROS:  Unable to obtain due to altered mental status.   Past Medical History  Diagnosis Date  . Hypertension   . Diabetes mellitus without complication   . Hypothyroidism   . Shortness of breath   . TIA (transient ischemic attack)   . COPD (chronic obstructive pulmonary disease)     Family History: Unable to assess secondary to patient's altered mental status.    Social History: Tob: Unable to assess secondary to patient's altered mental status.    Exam: Current vital signs: BP 171/75 mmHg  Pulse 101  Temp(Src) 100.1 F (37.8 C) (Oral)  Resp 26  Ht 6' (1.829 m)  Wt 106.323 kg (234 lb 6.4 oz)  BMI 31.78 kg/m2  SpO2 94% Vital signs in last 24 hours: Temp:  [98.5 F (36.9 C)-100.2 F (37.9 C)] 100.1 F (37.8 C) (07/16 0813) Pulse Rate:  [91-108] 101 (07/16 0813) Resp:  [13-26] 26 (07/16 0813) BP: (127-171)/(73-91) 171/75 mmHg (07/16 0813) SpO2:  [89 %-100 %] 94 % (07/16 0813)  Physical Exam  Constitutional: Appears well-developed and well-nourished.  Psych: Appears confused Eyes: No scleral injection HENT: No OP obstruction, neck supple Head: Normocephalic.  Cardiovascular: Normal rate and regular rhythm.  Respiratory: Effort normal and breath sounds normal to anterior ascultation GI: Soft.  No distension. There is no tenderness.  Skin: WDI  Neuro: Mental Status: Patient is awake, he is oriented to place, but perseverates on place when asked  month. He has variable cooperation with commands.  Cranial Nerves: II: Blinks to threat bilaterally,  III,IV, VI: EOMI without ptosis or diploplia.  V: Facial sensation is symmetric to temperature VII: Facial movement is symmetric.  VIII: hearing is intact to voice X, XI, XII: Unable to assess secondary to patient's altered mental status.  Motor: Tone is normal. Bulk is normal. 5/5 strength was present in all four extremities.  Sensory: Sensation is symmetric to light touch  Cerebellar: Unable to assess secondary to patient's altered mental status.   I have reviewed labs in epic and the results pertinent to this consultation are: Elevated creatinine on admission, improving HIV, RPR negative TSH, ammonia, WNL b12 high Magnesium 5 Elevated CO2 on admission   I have reviewed the images obtained:  MRI brain - negative acute  Impression: 74 yo M with acute MS change of unclear etiology. With fever, he needs an LP to rule out infection, though my suspicion for this is relatively low. I suspect a multifactorial delirium, though this is more often seen in patients with dementia, it is also more common in hypoxic patients(which he has been). If LP is negative, then I would favor continued conservative therapy with treatment of other medical problems and observation.   Recommendations: 1) LP with cells, glucose, protein, HSV by pcr, culture 2) will broaden coverage if pleocytosis on LP 3) will continue to follow.    Roland Rack, MD [facility] [phone]  If 7pm- 7am, please page neurology on call as listed in [place].

## 2015-01-20 NOTE — Progress Notes (Signed)
  Echocardiogram 2D Echocardiogram has been performed.  Harold Martin 01/20/2015, 11:58 AM

## 2015-01-20 NOTE — Procedures (Addendum)
Indication: Altered Mental Status  Risks of the procedure were dicussed with the patient including post-LP headache, bleeding, infection, weakness/numbness of legs(radiculopathy), death.  The patient/patient's proxy agreed and written consent was obtained.   The patient was prepped and draped, and using sterile technique a 20 gauge quinke spinal needle was inserted in the L3-4 space. Clear fluid was obtained on the second pass. The opening pressure was 10 cm H2O. Approximately 6 cc of CSF were obtained and sent for analysis.    Roland Rack, MD [facility] [phone]  If 7pm- 7am, please page neurology on call as listed in [place].

## 2015-01-20 NOTE — Progress Notes (Addendum)
[facility] PROGRESS NOTE  Harold Martin [id number] DOB: June 19, 1941 DOA: 01/16/2015 PCP: Dwan Bolt, MD  Summary 74 year old white male with history of hypertension, diabetes, chronic diarrhea, COPD who presented with fairly acute onset confusion. Per family, no memory problems at baseline and very independent. Workup thus far significant for acute renal failure and respiratory acidosis. Respiratory acidosis has been improving. Patient was unable to tolerate BiPAP. Patient remains confused however.  Assessment/Plan:  Principal Problem:   Acute encephalopathy:  Likely CO2 retention on admission.  Serial ABGs shows improvement, but patient remains confused.  Severe agitation requiring 4 point restraints has resolved however. Nurse also reports that he was pocketing food and unable to take pills.  Patient had been too agitated for MRI. EEG without epileptiform activity. Ammonia level and other workup negative other than acute renal failure. calmer today. Will reorder MRI and hopefully patient will be able to cooperate. Avoid sedatives as able except severe agitation.  Have consulted neurology for recommendations as etiology not entirely clear. Dr. Leonel Ramsay recommends stopping Lomotil in case anticholinergic effect.  Per family, patient had been on this for about a month without confusion however.  Acute hypercarbic respiratory failure:  Secondary to COPD exacerbation. Serial ABGs showed improvement gradual resolution Continue nebs. Have scheduled 4 times a day, but respiratory keeps adjusting regimen. Dispense as written.  Apears to have continued bronchospasm. Had been trying to avoid steroids given the unexplained encephalopathy. Will give another dose however this morning of Solu-Medrol.  Continue SDU.  On antibiotics for acute bronchitis. Has low-grade fever this morning which is the first since admission. Will repeat chest x-ray, rule out pneumonia, possibly aspiration  related.  bnp only 200 and cxr ok, so doubt CHF, but echo pending.      Acute renal failure superimposed on stage 3 chronic kidney disease secondary to diarrhea. Nearly resolved  Difficulty swallowing has developed over the past 24 hours. Will make nothing by mouth and give medications IV. Consult speech therapy.    Type 2 diabetes mellitus with hyperglycemia:  stable     Essential hypertension: Blood pressure creeping up. Will give IV metoprolol.  ACE inhibitor had been stopped due to renal failure.    Normocytic anemia chronic: No signs of bleeding. Monitor.    chronic diarrhea: Has been worked up extensively in the past. Celiac workup negative. C. difficile negative.   Code Status:  full Family Communication:  Daughter by phone Disposition Plan:  Likely SNF  HPI/Subjective: Denies dyspnea. Won't answer questions reliably. Per nursing staff, has been pocketing food and unable to swallow medications.  Objective: Filed Vitals:   01/20/15 0813  BP: 171/75  Pulse: 101  Temp: 100.1 F (37.8 C)  Resp: 26    Intake/Output Summary (Last 24 hours) at 01/20/15 0911 Last data filed at 01/20/15 0700  Gross per 24 hour  Intake   1278 ml  Output    400 ml  Net    878 ml   Filed Weights   01/17/15 1400  Weight: 106.323 kg (234 lb 6.4 oz)    Exam:   General:  Asleep. Arousable, but drowsy. will answer question or 2 but falls quickly back asleep. Disoriented  HEENT: Dry mucous membranes. Insipated mucus noted in oropharynx.  Cardiovascular: regular rate rhythm without murmurs gallops rubs   Respiratory:  Diminished throughout, prolonged expiratory phase with an element of abdominal breathing.  Abdomen:  soft nontender nondistended   Ext: No clubbing cyanosis or edema  Neurologic: no focal defecits  Basic Metabolic Panel:  Recent Labs Lab 01/16/15 1510 01/16/15 1519 01/17/15 0515 01/18/15 0525 01/19/15 0335 01/20/15 0247  NA 136 136 137 135 136 134*  K 5.0 4.9  4.7 4.5 4.7 4.6  CL 97* 97* 98* 98* 101 100*  CO2 32  --  31 29 29 27   GLUCOSE 108* 108* 129* 162* 200* 168*  BUN 24* 26* 24* 23* 20 16  CREATININE 2.32* 2.20* 2.13* 1.90* 1.42* 1.34*  CALCIUM 7.9*  --  7.8* 7.0* 7.6* 7.8*  MG  --   --  5.0*  --   --   --    Liver Function Tests:  Recent Labs Lab 01/16/15 1510 01/17/15 0515  AST 18 18  ALT 18 17  ALKPHOS 92 81  BILITOT 0.6 0.4  PROT 6.4* 6.2*  ALBUMIN 3.5 3.3*   No results for input(s): LIPASE, AMYLASE in the last 168 hours.  Recent Labs Lab 01/16/15 2046  AMMONIA 12   CBC:  Recent Labs Lab 01/16/15 1510 01/16/15 1519 01/17/15 0515 01/18/15 0525 01/20/15 0247  WBC 8.5  --  9.5 7.4 8.8  NEUTROABS 6.7  --   --   --   --   HGB 9.0* 10.2* 8.9* 8.3* 9.0*  HCT 30.5* 30.0* 29.9* 28.5*  26.7* 30.0*  MCV 83.3  --  85.2 84.8 84.3  PLT 278  --  266 254 241   Cardiac Enzymes: No results for input(s): CKTOTAL, CKMB, CKMBINDEX, TROPONINI in the last 168 hours. BNP (last 3 results)  Recent Labs  01/18/15 1035  BNP 200.6*    ProBNP (last 3 results) No results for input(s): PROBNP in the last 8760 hours.  CBG:  Recent Labs Lab 01/18/15 2154 01/19/15 0813 01/19/15 1232 01/19/15 1654 01/19/15 2117  GLUCAP 220* 153* 180* 173* 156*    Recent Results (from the past 240 hour(s))  Clostridium Difficile by PCR (not at [facility])     Status: None   Collection Time: 01/17/15 10:00 AM  Result Value Ref Range Status   C difficile by pcr NEGATIVE NEGATIVE Final  Urine culture     Status: None   Collection Time: 01/17/15  1:50 PM  Result Value Ref Range Status   Specimen Description URINE, RANDOM  Final   Special Requests NONE  Final   Culture   Final    MULTIPLE SPECIES PRESENT, SUGGEST RECOLLECTION IF CLINICALLY INDICATED   Report Status 01/19/2015 FINAL  Final     Studies: Dg Chest Port 1 View  01/18/2015   CLINICAL DATA:  Cough and congestion.  EXAM: PORTABLE CHEST - 1 VIEW  COMPARISON:  None.  FINDINGS:  Mediastinum and hilar structures are normal. Cardiomegaly with normal pulmonary vascularity . Low lung volumes with mild right base subsegmental atelectasis. Mild infiltrate cannot be excluded. No pleural effusion or pneumothorax . No acute bony abnormality.  IMPRESSION: 1. Low lung volumes with mild right base subsegmental atelectasis and or infiltrate. 2. Cardiomegaly.  No pulmonary venous congestion .   Electronically Signed   By: [place]   On: 01/18/2015 09:16    Scheduled Meds: . azithromycin  500 mg Intravenous Q24H  . cefTRIAXone (ROCEPHIN)  IV  1 g Intravenous Q24H  . enoxaparin (LOVENOX) injection  40 mg Subcutaneous Q24H  . haloperidol lactate  5 mg Intravenous Once  . insulin aspart  0-5 Units Subcutaneous QHS  . insulin aspart  0-9 Units Subcutaneous TID WC  . ipratropium-albuterol  3 mL Nebulization QID  . levothyroxine  75 mcg Intravenous Daily  . methylPREDNISolone (SOLU-MEDROL) injection  60 mg Intravenous Once  . metoprolol  5 mg Intravenous 4 times per day  . sodium chloride  3 mL Intravenous Q12H   Continuous Infusions: . sodium chloride 0.9 % 1,000 mL infusion 75 mL/hr at 01/19/15 2142    Time spent: 35 minutes  [facility] [email or web address], password [facility] 01/20/2015, 9:11 AM  LOS: 4 days

## 2015-01-20 NOTE — Evaluation (Signed)
Clinical/Bedside Swallow Evaluation Patient Details  Name: Harold Martin MRN: [id number] Date of Birth: 19-Apr-1941  Today's Date: 01/20/2015 Time: SLP Start Time (ACUTE ONLY): 1045 SLP Stop Time (ACUTE ONLY): 1115 SLP Time Calculation (min) (ACUTE ONLY): 30 min  Past Medical History:  Past Medical History  Diagnosis Date  . Hypertension   . Diabetes mellitus without complication   . Hypothyroidism   . Shortness of breath   . TIA (transient ischemic attack)   . COPD (chronic obstructive pulmonary disease)    Past Surgical History:  Past Surgical History  Procedure Laterality Date  . Surgery for bladder cancer  yrs ago  . Esophagogastroduodenoscopy N/A 09/23/2013    Procedure: ESOPHAGOGASTRODUODENOSCOPY (EGD);  Surgeon: Beryle Beams, MD;  Location: [facility];  Service: Endoscopy;  Laterality: N/A;  . Colonoscopy N/A 09/23/2013    Procedure: COLONOSCOPY;  Surgeon: Beryle Beams, MD;  Location: [facility];  Service: Endoscopy;  Laterality: N/A;  . Givens capsule study N/A 10/07/2013    Procedure: GIVENS CAPSULE STUDY;  Surgeon: Beryle Beams, MD;  Location: [facility];  Service: Endoscopy;  Laterality: N/A;   HPI:  74 year old male with a history of hypertension, diabetes mellitus, CKD stage II-III, hypothyroidism presents with one-day history of confusion. According to the patient's family at the bedside, the patient was his usual self on the night before admission. When the patient woke up on the morning of admission, the patient was noted to be confused. In addition, he's been unstable and his gait but has not fallen recently at home. There has been no recent fevers, chills, vomiting, but the patient endorses chronic loose stools for the past 2 months without any hematochezia or melena. The patient denies any chest pain, harsh breath, coughing, hemoptysis. Patient denies any recent antibiotics. He denies any headaches or visual disturbance or focal extremity weakness. There is no  dysarthria.   Assessment / Plan / Recommendation Clinical Impression   Pt with s/s of overt aspiration including wet vocal quality with consistencies assessed including ice chips and puree with refusal of other consistencies by pt; no cough volitionally initiated, but observed to be weak with delayed cough spontaneously after small amount of intake of po's; pt's cognitive status has declined d/t being encephalopathic per MD and having a change in mentation which places him at a high risk for aspiration; pt oriented to self only and appeared to have an overall delay in processing information with simple directives only followed with max visual/verbal cues required during partial BSE; limited OME completed as well; ST to f/u for possible po intake initiation as pt is able to tolerate; recommend NPO status    Aspiration Risk  Severe    Diet Recommendation NPO   Medication Administration: Other (Comment) (crushed with puree or alternative means depending on cogn)    Other  Recommendations Oral Care Recommendations: Oral care QID Other Recommendations: Have oral suction available   Follow Up Recommendations       Frequency and Duration min 3x week  2 weeks   Pertinent Vitals/Pain Febrile    SLP Swallow Goals  see POC   Swallow Study Prior Functional Status   Unknown    General Date of Onset: 01/16/15 Other Pertinent Information: 74 year old male with a history of hypertension, diabetes mellitus, CKD stage II-III, hypothyroidism presents with one-day history of confusion. According to the patient's family at the bedside, the patient was his usual self on the night before admission. When the patient woke up on the  morning of admission, the patient was noted to be confused. In addition, he's been unstable and his gait but has not fallen recently at home. There has been no recent fevers, chills, vomiting, but the patient endorses chronic loose stools for the past 2 months without any  hematochezia or melena. The patient denies any chest pain, harsh breath, coughing, hemoptysis. Patient denies any recent antibiotics. He denies any headaches or visual disturbance or focal extremity weakness. There is no dysarthria. Type of Study: Bedside swallow evaluation Previous Swallow Assessment: n/a Diet Prior to this Study: NPO Temperature Spikes Noted: Yes Respiratory Status: Room air Behavior/Cognition: Confused;Lethargic/Drowsy Oral Cavity - Dentition: Missing dentition;Other (Comment) (upper dentures; lower edentulous) Self-Feeding Abilities: Needs assist;Needs set up Patient Positioning: Upright in bed Baseline Vocal Quality: Wet Volitional Cough: Cognitively unable to elicit Volitional Swallow: Unable to elicit    Oral/Motor/Sensory Function Overall Oral Motor/Sensory Function: Other (comment) (Pt would not complete OME fully; appears WFL)   Ice Chips Ice chips: Impaired Presentation: Spoon Oral Phase Impairments: Impaired anterior to posterior transit Oral Phase Functional Implications: Oral holding Pharyngeal Phase Impairments: Suspected delayed Swallow;Wet Vocal Quality   Thin Liquid Thin Liquid: Not tested Other Comments:  (Pt refused)    Nectar Thick Nectar Thick Liquid: Not tested   Honey Thick Honey Thick Liquid: Not tested   Puree Puree: Impaired Presentation: Spoon Oral Phase Impairments: Impaired anterior to posterior transit Oral Phase Functional Implications: Oral holding Pharyngeal Phase Impairments: Suspected delayed Swallow;Wet Vocal Quality   Solid       Solid: Not tested Other Comments:  (Pt refused)       Gemayel Mascio,PAT, M.S, CCC-SLP 01/20/2015,12:06 PM

## 2015-01-21 DIAGNOSIS — N179 Acute kidney failure, unspecified: Secondary | ICD-10-CM

## 2015-01-21 DIAGNOSIS — J441 Chronic obstructive pulmonary disease with (acute) exacerbation: Secondary | ICD-10-CM

## 2015-01-21 DIAGNOSIS — N183 Chronic kidney disease, stage 3 (moderate): Secondary | ICD-10-CM

## 2015-01-21 DIAGNOSIS — E1165 Type 2 diabetes mellitus with hyperglycemia: Secondary | ICD-10-CM

## 2015-01-21 DIAGNOSIS — G9340 Encephalopathy, unspecified: Secondary | ICD-10-CM

## 2015-01-21 LAB — CBC
HCT: 29.3 % — ABNORMAL LOW (ref 39.0–52.0)
Hemoglobin: 9 g/dL — ABNORMAL LOW (ref 13.0–17.0)
MCH: 25.1 pg — AB (ref 26.0–34.0)
MCHC: 30.7 g/dL (ref 30.0–36.0)
MCV: 81.6 fL (ref 78.0–100.0)
PLATELETS: 263 10*3/uL (ref 150–400)
RBC: 3.59 MIL/uL — ABNORMAL LOW (ref 4.22–5.81)
RDW: 15.5 % (ref 11.5–15.5)
WBC: 10.4 10*3/uL (ref 4.0–10.5)

## 2015-01-21 LAB — BLOOD GAS, ARTERIAL
ACID-BASE DEFICIT: 0.7 mmol/L (ref 0.0–2.0)
Bicarbonate: 22.6 mEq/L (ref 20.0–24.0)
Drawn by: 313061
FIO2: 0.21 %
O2 Saturation: 94.1 %
PATIENT TEMPERATURE: 98.6
TCO2: 23.6 mmol/L (ref 0–100)
pCO2 arterial: 32.6 mmHg — ABNORMAL LOW (ref 35.0–45.0)
pH, Arterial: 7.456 — ABNORMAL HIGH (ref 7.350–7.450)
pO2, Arterial: 64.2 mmHg — ABNORMAL LOW (ref 80.0–100.0)

## 2015-01-21 LAB — BASIC METABOLIC PANEL
Anion gap: 12 (ref 5–15)
BUN: 12 mg/dL (ref 6–20)
CHLORIDE: 99 mmol/L — AB (ref 101–111)
CO2: 21 mmol/L — ABNORMAL LOW (ref 22–32)
Calcium: 8 mg/dL — ABNORMAL LOW (ref 8.9–10.3)
Creatinine, Ser: 1.11 mg/dL (ref 0.61–1.24)
GFR calc non Af Amer: >60 mL/min (ref >60)
GLUCOSE: 155 mg/dL — AB (ref 65–99)
POTASSIUM: 4.9 mmol/L (ref 3.5–5.1)
Sodium: 132 mmol/L — ABNORMAL LOW (ref 135–145)

## 2015-01-21 LAB — GLUCOSE, CAPILLARY
GLUCOSE-CAPILLARY: 161 mg/dL — AB (ref 65–99)
GLUCOSE-CAPILLARY: 164 mg/dL — AB (ref 65–99)
Glucose-Capillary: 182 mg/dL — ABNORMAL HIGH (ref 65–99)
Glucose-Capillary: 159 mg/dL — ABNORMAL HIGH (ref 65–99)
Glucose-Capillary: 172 mg/dL — ABNORMAL HIGH (ref 65–99)

## 2015-01-21 LAB — CORTISOL: Cortisol, Plasma: 12.3 ug/dL

## 2015-01-21 MED ORDER — CETYLPYRIDINIUM CHLORIDE 0.05 % MT LIQD
7.0000 mL | Freq: Two times a day (BID) | OROMUCOSAL | Status: DC
  Administered 2015-01-21 – 2015-01-25 (×10): 7 mL via OROMUCOSAL

## 2015-01-21 MED ORDER — THIAMINE HCL 100 MG/ML IJ SOLN
100.0000 mg | Freq: Every day | INTRAMUSCULAR | Status: DC
  Administered 2015-01-21 – 2015-01-23 (×3): 100 mg via INTRAVENOUS
  Filled 2015-01-21 (×4): qty 1

## 2015-01-21 MED ORDER — INSULIN ASPART 100 UNIT/ML ~~LOC~~ SOLN
0.0000 [IU] | SUBCUTANEOUS | Status: DC
  Administered 2015-01-21 – 2015-01-22 (×8): 3 [IU] via SUBCUTANEOUS

## 2015-01-21 MED ORDER — DEXTROSE-NACL 5-0.9 % IV SOLN
INTRAVENOUS | Status: DC
  Administered 2015-01-21: 1000 mL via INTRAVENOUS
  Administered 2015-01-22 – 2015-01-23 (×3): via INTRAVENOUS

## 2015-01-21 MED ORDER — IPRATROPIUM-ALBUTEROL 0.5-2.5 (3) MG/3ML IN SOLN
3.0000 mL | RESPIRATORY_TRACT | Status: DC | PRN
Start: 2015-01-21 — End: 2015-01-23
  Administered 2015-01-22: 3 mL via RESPIRATORY_TRACT
  Filled 2015-01-21: qty 3

## 2015-01-21 MED ORDER — CHLORHEXIDINE GLUCONATE 0.12 % MT SOLN
15.0000 mL | Freq: Two times a day (BID) | OROMUCOSAL | Status: DC
  Administered 2015-01-21 – 2015-01-25 (×9): 15 mL via OROMUCOSAL
  Filled 2015-01-21 (×11): qty 15

## 2015-01-21 NOTE — Progress Notes (Signed)
[facility] PROGRESS NOTE  LOAY ILES [id number] DOB: [birth year] DOA: 01/16/2015 PCP: Dwan Bolt, MD  Summary 74 year old white male with history of hypertension, diabetes, chronic diarrhea, COPD who presented with fairly acute onset confusion. Per family, no memory problems at baseline and very independent. Workup thus far significant for acute renal failure and respiratory acidosis. Respiratory acidosis has been improving. Patient was unable to tolerate BiPAP. Patient remains confused however.  Assessment/Plan:    Acute encephalopathy:  Likely CO2 retention on admission.  Serial ABGs shows improvement, but patient remains confused.  Severe agitation requiring 4 point restraints has resolved however. Nurse also reports that he was pocketing food and unable to take pills.  P - EEG without epileptiform activity.  -Ammonia level and other workup negative other than acute renal failure - Avoid sedatives as able except severe agitation.   -consulted neurology for recommendations as etiology not entirely clear. Dr. Leonel Ramsay recommends stopping Lomotil in case anticholinergic effect.  LP done  Acute hypercarbic respiratory failure:  Secondary to COPD exacerbation. Serial ABGs showed improvement gradual resolution Continue nebs. Have scheduled 4 times a day, but respiratory keeps adjusting regimen. Dispense as written.  Apears to have continued bronchospasm. Had been trying to avoid steroids given the unexplained encephalopathy.  Continue SDU.  On antibiotics for acute bronchitis. Has low-grade fever this morning which is the first since admission.       Acute renal failure superimposed on stage 3 chronic kidney disease secondary to diarrhea. Nearly resolved  Difficulty swallowing has developed over the past 24 hours. Will make nothing by mouth and give medications IV. Consult speech therapy.    Type 2 diabetes mellitus with hyperglycemia:  Stable, add SSI and some d5   Essential hypertension: Blood pressure creeping up. Will give IV metoprolol.  ACE inhibitor had been stopped due to renal failure.    Normocytic anemia chronic: No signs of bleeding. Monitor.    chronic diarrhea: Has been worked up extensively in the past. Celiac workup negative. C. difficile negative.   Code Status:  full Family Communication:   Disposition Plan:  Remain in SDU for now--- Likely SNF  HPI/Subjective: Will not follow commands, nods yes with every question  Objective: Filed Vitals:   01/21/15 0808  BP: 117/43  Pulse: 85  Temp: 98.8 F (37.1 C)  Resp: 20    Intake/Output Summary (Last 24 hours) at 01/21/15 1105 Last data filed at 01/21/15 0952  Gross per 24 hour  Intake   1584 ml  Output   1225 ml  Net    359 ml   Filed Weights   01/17/15 1400  Weight: 106.323 kg (234 lb 6.4 oz)    Exam:   General:  Awake, NAD  HEENT: Dry mucous membranes. Insipated mucus noted in oropharynx.  Cardiovascular: regular rate rhythm without murmurs gallops rubs   Respiratory:  Diminished throughout  Abdomen:  soft nontender nondistended   Ext: No clubbing cyanosis or edema  Neurologic: no focal defecits, will not follow commands  Basic Metabolic Panel:  Recent Labs Lab 01/17/15 0515 01/18/15 0525 01/19/15 0335 01/20/15 0247 01/21/15 0325  NA 137 135 136 134* 132*  K 4.7 4.5 4.7 4.6 4.9  CL 98* 98* 101 100* 99*  CO2 31 29 29 27  21*  GLUCOSE 129* 162* 200* 168* 155*  BUN 24* 23* 20 16 12   CREATININE 2.13* 1.90* 1.42* 1.34* 1.11  CALCIUM 7.8* 7.0* 7.6* 7.8* 8.0*  MG 5.0*  --   --   --   --  Liver Function Tests:  Recent Labs Lab 01/16/15 1510 01/17/15 0515  AST 18 18  ALT 18 17  ALKPHOS 92 81  BILITOT 0.6 0.4  PROT 6.4* 6.2*  ALBUMIN 3.5 3.3*   No results for input(s): LIPASE, AMYLASE in the last 168 hours.  Recent Labs Lab 01/16/15 2046  AMMONIA 12   CBC:  Recent Labs Lab 01/16/15 1510 01/16/15 1519 01/17/15 0515 01/18/15 0525  01/20/15 0247 01/21/15 0325  WBC 8.5  --  9.5 7.4 8.8 10.4  NEUTROABS 6.7  --   --   --   --   --   HGB 9.0* 10.2* 8.9* 8.3* 9.0* 9.0*  HCT 30.5* 30.0* 29.9* 28.5*  26.7* 30.0* 29.3*  MCV 83.3  --  85.2 84.8 84.3 81.6  PLT 278  --  266 254 241 263   Cardiac Enzymes: No results for input(s): CKTOTAL, CKMB, CKMBINDEX, TROPONINI in the last 168 hours. BNP (last 3 results)  Recent Labs  01/18/15 1035  BNP 200.6*    ProBNP (last 3 results) No results for input(s): PROBNP in the last 8760 hours.  CBG:  Recent Labs Lab 01/20/15 0811 01/20/15 1227 01/20/15 1612 01/20/15 2143 01/21/15 0807  GLUCAP 184* 195* 156* 183* 161*    Recent Results (from the past 240 hour(s))  Clostridium Difficile by PCR (not at [facility])     Status: None   Collection Time: 01/17/15 10:00 AM  Result Value Ref Range Status   C difficile by pcr NEGATIVE NEGATIVE Final  Urine culture     Status: None   Collection Time: 01/17/15  1:50 PM  Result Value Ref Range Status   Specimen Description URINE, RANDOM  Final   Special Requests NONE  Final   Culture   Final    MULTIPLE SPECIES PRESENT, SUGGEST RECOLLECTION IF CLINICALLY INDICATED   Report Status 01/19/2015 FINAL  Final  CSF culture     Status: None (Preliminary result)   Collection Time: 01/20/15 12:52 PM  Result Value Ref Range Status   Specimen Description CSF  Final   Special Requests NONE  Final   Gram Stain CYTOSPIN SLIDE NO WBC SEEN NO ORGANISMS SEEN   Final   Culture PENDING  Incomplete   Report Status PENDING  Incomplete     Studies: Mr Brain Wo Contrast  01/20/2015   CLINICAL DATA:  74 year old male with acute onset confusion, combative. Initial encounter. Hypertension, diabetes, COPD.  EXAM: MRI HEAD WITHOUT CONTRAST  TECHNIQUE: Multiplanar, multiecho pulse sequences of the brain and surrounding structures were obtained without intravenous contrast.  COMPARISON:  Head CT without contrast 01/16/2015. Brain MRI 09/11/2014 and earlier.   FINDINGS: Study is intermittently degraded by motion artifact despite repeated imaging attempts.  Major intracranial vascular flow voids are stable. No restricted diffusion to suggest acute infarction. No midline shift, mass effect, evidence of mass lesion, ventriculomegaly, extra-axial collection or acute intracranial hemorrhage. Cervicomedullary junction and pituitary are within normal limits.  Stable gray and white matter signal, with minimal to mild for age nonspecific cerebral white matter T2 hyperintensity. No cortical encephalomalacia or chronic cerebral blood products identified.  Visible internal auditory structures appear normal. Trace left mastoid effusion is unchanged. Other paranasal sinuses and mastoids are stable and largely clear. Orbits soft tissues appear normal. Negative scalp soft tissues. Normal bone marrow signal. Stable visualized cervical spine, partially visible ACDF hardware artifact at C3.  IMPRESSION: No acute intracranial abnormality. Stable and largely unremarkable for age noncontrast MRI appearance of the brain.  Electronically Signed   By: Genevie Ann M.D.   On: 01/20/2015 10:59   Dg Chest Port 1 View  01/20/2015   CLINICAL DATA:  history of hypertension, diabetes, chronic diarrhea, COPD who presented with fairly acute onset confusion. Has respiratory acidosis and productive cough. Pt is confused and combative  EXAM: PORTABLE CHEST - 1 VIEW  COMPARISON:  None.  FINDINGS: The heart size and mediastinal contours are within normal limits. Both lungs are clear. No pleural effusion or pneumothorax. Bony thorax grossly intact.  IMPRESSION: No acute cardiopulmonary disease.   Electronically Signed   By: Lajean Manes M.D.   On: 01/20/2015 11:00    Scheduled Meds: . antiseptic oral rinse  7 mL Mouth Rinse q12n4p  . azithromycin  500 mg Intravenous Q24H  . cefTRIAXone (ROCEPHIN)  IV  1 g Intravenous Q24H  . chlorhexidine  15 mL Mouth Rinse BID  . enoxaparin (LOVENOX) injection  40 mg  Subcutaneous Q24H  . haloperidol lactate  5 mg Intravenous Once  . insulin aspart  0-15 Units Subcutaneous 6 times per day  . ipratropium-albuterol  3 mL Nebulization QID  . levothyroxine  75 mcg Intravenous QAC breakfast  . metoprolol  5 mg Intravenous 4 times per day  . sodium chloride  3 mL Intravenous Q12H  . thiamine IV  100 mg Intravenous Daily   Continuous Infusions: . dextrose 5 % and 0.9% NaCl    . sodium chloride 0.9 % 1,000 mL infusion 75 mL/hr at 01/19/15 2142    Time spent: 35 minutes  Rejoice Heatwole  [facility] [email or web address], password [email or web address] 01/21/2015, 11:05 AM  LOS: 5 days

## 2015-01-21 NOTE — Progress Notes (Signed)
Subjective: Continues to be densely encephalopathic  Exam: Filed Vitals:   01/21/15 1200  BP:   Pulse:   Temp: 99.1 F (37.3 C)  Resp:    Gen: In bed, NAD Resp: non-labored breathing, no acute distress Abd: soft, nt  Neuro: MS: Awake, follows simple commands, answers some simple questions, but just laughs when I ask him his naem,  [id number], blinks to threa bilaterally.  Motor: MAEW Sensory:responds to stim x 4.   Pertinent Labs:   Impression: 74 yo M with acute MS change of unclear etiology. LP effectively rules out infectious process, and MRI shows no sign of stroke. He has not been losing weight to suggest vitamin deficiency or had hypotension to suggest cortisol deficiency. At this time, I am still not sure what is causing him to be this encephalopathic. IF he was more hypoxic at some point, this could give a picture similar to what we are seeing now, but will contineu to exclude other causes.   Recommendations: 1) cortisol.  2) B1 3) repeat abg 4) EEG 5) will continue to follow.   Roland Rack, MD [facility] [phone]  If 7pm- 7am, please page neurology on call as listed in [place].

## 2015-01-22 ENCOUNTER — Inpatient Hospital Stay (HOSPITAL_COMMUNITY): Payer: Medicare Other

## 2015-01-22 DIAGNOSIS — J9602 Acute respiratory failure with hypercapnia: Secondary | ICD-10-CM

## 2015-01-22 DIAGNOSIS — I10 Essential (primary) hypertension: Secondary | ICD-10-CM

## 2015-01-22 LAB — GLUCOSE, CAPILLARY
GLUCOSE-CAPILLARY: 157 mg/dL — AB (ref 65–99)
GLUCOSE-CAPILLARY: 200 mg/dL — AB (ref 65–99)
Glucose-Capillary: 164 mg/dL — ABNORMAL HIGH (ref 65–99)
Glucose-Capillary: 152 mg/dL — ABNORMAL HIGH (ref 65–99)
Glucose-Capillary: 159 mg/dL — ABNORMAL HIGH (ref 65–99)
Glucose-Capillary: 196 mg/dL — ABNORMAL HIGH (ref 65–99)
Glucose-Capillary: 161 mg/dL — ABNORMAL HIGH (ref 65–99)

## 2015-01-22 LAB — CBC
HEMATOCRIT: 30.6 % — AB (ref 39.0–52.0)
Hemoglobin: 9.2 g/dL — ABNORMAL LOW (ref 13.0–17.0)
MCH: 24.8 pg — ABNORMAL LOW (ref 26.0–34.0)
MCHC: 30.1 g/dL (ref 30.0–36.0)
MCV: 82.5 fL (ref 78.0–100.0)
Platelets: 255 10*3/uL (ref 150–400)
RBC: 3.71 MIL/uL — ABNORMAL LOW (ref 4.22–5.81)
RDW: 15.7 % — AB (ref 11.5–15.5)
WBC: 9.6 10*3/uL (ref 4.0–10.5)

## 2015-01-22 LAB — COMPREHENSIVE METABOLIC PANEL
ALK PHOS: 76 U/L (ref 38–126)
ALT: 14 U/L — ABNORMAL LOW (ref 17–63)
AST: 14 U/L — AB (ref 15–41)
Albumin: 2.6 g/dL — ABNORMAL LOW (ref 3.5–5.0)
Anion gap: 7 (ref 5–15)
BUN: 9 mg/dL (ref 6–20)
CO2: 24 mmol/L (ref 22–32)
Calcium: 8.4 mg/dL — ABNORMAL LOW (ref 8.9–10.3)
Chloride: 102 mmol/L (ref 101–111)
Creatinine, Ser: 1.12 mg/dL (ref 0.61–1.24)
GFR calc Af Amer: >60 mL/min (ref >60)
Glucose, Bld: 165 mg/dL — ABNORMAL HIGH (ref 65–99)
POTASSIUM: 4.3 mmol/L (ref 3.5–5.1)
SODIUM: 133 mmol/L — AB (ref 135–145)
Total Bilirubin: 0.2 mg/dL — ABNORMAL LOW (ref 0.3–1.2)
Total Protein: 5.9 g/dL — ABNORMAL LOW (ref 6.5–8.1)

## 2015-01-22 LAB — MRSA PCR SCREENING: MRSA by PCR: NEGATIVE

## 2015-01-22 LAB — HERPES SIMPLEX VIRUS(HSV) DNA BY PCR
HSV 1 DNA: NEGATIVE
HSV 2 DNA: NEGATIVE

## 2015-01-22 MED ORDER — INSULIN ASPART 100 UNIT/ML ~~LOC~~ SOLN: 0.0000 [IU] | Freq: Three times a day (TID) | SUBCUTANEOUS | Status: DC

## 2015-01-22 MED ORDER — INSULIN ASPART 100 UNIT/ML ~~LOC~~ SOLN
0.0000 [IU] | SUBCUTANEOUS | Status: DC
  Administered 2015-01-23 (×4): 3 [IU] via SUBCUTANEOUS
  Administered 2015-01-23: 8 [IU] via SUBCUTANEOUS
  Administered 2015-01-23 – 2015-01-24 (×3): 3 [IU] via SUBCUTANEOUS

## 2015-01-22 MED ORDER — ALBUTEROL SULFATE (2.5 MG/3ML) 0.083% IN NEBU: 2.5000 mg | INHALATION_SOLUTION | RESPIRATORY_TRACT | Status: DC | PRN

## 2015-01-22 NOTE — Progress Notes (Signed)
[facility] PROGRESS NOTE  Harold Martin [id number] DOB: Dec 09, 1940 DOA: 01/16/2015 PCP: Dwan Bolt, MD  Summary 74 year old white male with history of hypertension, diabetes, chronic diarrhea, COPD who presented with fairly acute onset confusion. Per family, no memory problems at baseline and very independent. Workup thus far significant for acute renal failure and respiratory acidosis. Respiratory acidosis has been improving. Patient was unable to tolerate BiPAP. Patient remains confused however.  Assessment/Plan:    Acute encephalopathy:  Likely CO2 retention on admission but ABG improved and patient remained confused.   - EEG without epileptiform activity.  -Ammonia level and other workup negative other than acute renal failure - Avoid sedatives as able except severe agitation.   -consulted neurology for recommendations as etiology not entirely clear. Dr. Leonel Ramsay recommends stopping Lomotil in case anticholinergic effect.  LP done -patient is following commands today  Acute hypercarbic respiratory failure:  Secondary to COPD exacerbation. Serial ABGs showed improvement gradual resolution Continue nebs. Have scheduled 4 times a day, but respiratory keeps adjusting regimen. Dispense as written.  Apears to have continued bronchospasm. Had been trying to avoid steroids given the unexplained encephalopathy.  Continue SDU.  On antibiotics for acute bronchitis. Has low-grade fever this morning which is the first since admission.       Acute renal failure superimposed on stage 3 chronic kidney disease secondary to diarrhea. Nearly resolved    Type 2 diabetes mellitus with hyperglycemia:  Stable, add SSI and some d5    Essential hypertension: Blood pressure creeping up. Will give IV metoprolol.  ACE inhibitor had been stopped due to renal failure.    Normocytic anemia chronic: No signs of bleeding. Monitor.    chronic diarrhea: Has been worked up extensively in the  past. Celiac workup negative. C. difficile negative.   Code Status:  full Family Communication:  LM for family Disposition Plan:  Remain in SDU for now--- Likely SNF  HPI/Subjective: Following commands- more awake today  Objective: Filed Vitals:   01/22/15 0400  BP: 122/50  Pulse: 78  Temp: 98.5 F (36.9 C)  Resp: 20    Intake/Output Summary (Last 24 hours) at 01/22/15 0744 Last data filed at 01/22/15 0655  Gross per 24 hour  Intake 1247.66 ml  Output   1325 ml  Net -77.34 ml   Filed Weights   01/17/15 1400  Weight: 106.323 kg (234 lb 6.4 oz)    Exam:   General:  Awake, NAD  Cardiovascular: regular rate rhythm without murmurs gallops rubs   Respiratory:  Diminished throughout  Abdomen:  soft nontender nondistended   Ext: No clubbing cyanosis or edema  Neurologic: no focal defecits, following commands  Basic Metabolic Panel:  Recent Labs Lab 01/17/15 0515 01/18/15 0525 01/19/15 0335 01/20/15 0247 01/21/15 0325 01/22/15 0325  NA 137 135 136 134* 132* 133*  K 4.7 4.5 4.7 4.6 4.9 4.3  CL 98* 98* 101 100* 99* 102  CO2 31 29 29 27  21* 24  GLUCOSE 129* 162* 200* 168* 155* 165*  BUN 24* 23* 20 16 12 9   CREATININE 2.13* 1.90* 1.42* 1.34* 1.11 1.12  CALCIUM 7.8* 7.0* 7.6* 7.8* 8.0* 8.4*  MG 5.0*  --   --   --   --   --    Liver Function Tests:  Recent Labs Lab 01/16/15 1510 01/17/15 0515 01/22/15 0325  AST 18 18 14*  ALT 18 17 14*  ALKPHOS 92 81 76  BILITOT 0.6 0.4 0.2*  PROT 6.4* 6.2* 5.9*  ALBUMIN 3.5 3.3* 2.6*   No results for input(s): LIPASE, AMYLASE in the last 168 hours.  Recent Labs Lab 01/16/15 2046  AMMONIA 12   CBC:  Recent Labs Lab 01/16/15 1510  01/17/15 0515 01/18/15 0525 01/20/15 0247 01/21/15 0325 01/22/15 0325  WBC 8.5  --  9.5 7.4 8.8 10.4 9.6  NEUTROABS 6.7  --   --   --   --   --   --   HGB 9.0*  < > 8.9* 8.3* 9.0* 9.0* 9.2*  HCT 30.5*  < > 29.9* 28.5*  26.7* 30.0* 29.3* 30.6*  MCV 83.3  --  85.2 84.8 84.3  81.6 82.5  PLT 278  --  266 254 241 263 255  < > = values in this interval not displayed. Cardiac Enzymes: No results for input(s): CKTOTAL, CKMB, CKMBINDEX, TROPONINI in the last 168 hours. BNP (last 3 results)  Recent Labs  01/18/15 1035  BNP 200.6*    ProBNP (last 3 results) No results for input(s): PROBNP in the last 8760 hours.  CBG:  Recent Labs Lab 01/21/15 1556 01/21/15 2020 01/21/15 2031 01/22/15 0041 01/22/15 0443  GLUCAP 182* 159* 172* 157* 164*    Recent Results (from the past 240 hour(s))  Clostridium Difficile by PCR (not at [facility])     Status: None   Collection Time: 01/17/15 10:00 AM  Result Value Ref Range Status   C difficile by pcr NEGATIVE NEGATIVE Final  Urine culture     Status: None   Collection Time: 01/17/15  1:50 PM  Result Value Ref Range Status   Specimen Description URINE, RANDOM  Final   Special Requests NONE  Final   Culture   Final    MULTIPLE SPECIES PRESENT, SUGGEST RECOLLECTION IF CLINICALLY INDICATED   Report Status 01/19/2015 FINAL  Final  CSF culture     Status: None (Preliminary result)   Collection Time: 01/20/15 12:52 PM  Result Value Ref Range Status   Specimen Description CSF  Final   Special Requests NONE  Final   Gram Stain CYTOSPIN SLIDE NO WBC SEEN NO ORGANISMS SEEN   Final   Culture NO GROWTH < 24 HOURS  Final   Report Status PENDING  Incomplete     Studies: Mr Brain Wo Contrast  01/20/2015   CLINICAL DATA:  74 year old male with acute onset confusion, combative. Initial encounter. Hypertension, diabetes, COPD.  EXAM: MRI HEAD WITHOUT CONTRAST  TECHNIQUE: Multiplanar, multiecho pulse sequences of the brain and surrounding structures were obtained without intravenous contrast.  COMPARISON:  Head CT without contrast 01/16/2015. Brain MRI 09/11/2014 and earlier.  FINDINGS: Study is intermittently degraded by motion artifact despite repeated imaging attempts.  Major intracranial vascular flow voids are stable. No  restricted diffusion to suggest acute infarction. No midline shift, mass effect, evidence of mass lesion, ventriculomegaly, extra-axial collection or acute intracranial hemorrhage. Cervicomedullary junction and pituitary are within normal limits.  Stable gray and white matter signal, with minimal to mild for age nonspecific cerebral white matter T2 hyperintensity. No cortical encephalomalacia or chronic cerebral blood products identified.  Visible internal auditory structures appear normal. Trace left mastoid effusion is unchanged. Other paranasal sinuses and mastoids are stable and largely clear. Orbits soft tissues appear normal. Negative scalp soft tissues. Normal bone marrow signal. Stable visualized cervical spine, partially visible ACDF hardware artifact at C3.  IMPRESSION: No acute intracranial abnormality. Stable and largely unremarkable for age noncontrast MRI appearance of the brain.   Electronically Signed   By: Lemmie Evens  [place] Crane M.D.   On: 01/20/2015 10:59   Dg Chest Port 1 View  01/20/2015   CLINICAL DATA:  history of hypertension, diabetes, chronic diarrhea, COPD who presented with fairly acute onset confusion. Has respiratory acidosis and productive cough. Pt is confused and combative  EXAM: PORTABLE CHEST - 1 VIEW  COMPARISON:  None.  FINDINGS: The heart size and mediastinal contours are within normal limits. Both lungs are clear. No pleural effusion or pneumothorax. Bony thorax grossly intact.  IMPRESSION: No acute cardiopulmonary disease.   Electronically Signed   By: Lajean Manes M.D.   On: 01/20/2015 11:00    Scheduled Meds: . antiseptic oral rinse  7 mL Mouth Rinse q12n4p  . azithromycin  500 mg Intravenous Q24H  . cefTRIAXone (ROCEPHIN)  IV  1 g Intravenous Q24H  . chlorhexidine  15 mL Mouth Rinse BID  . enoxaparin (LOVENOX) injection  40 mg Subcutaneous Q24H  . haloperidol lactate  5 mg Intravenous Once  . insulin aspart  0-15 Units Subcutaneous 6 times per day  . levothyroxine  75 mcg  Intravenous QAC breakfast  . metoprolol  5 mg Intravenous 4 times per day  . sodium chloride  3 mL Intravenous Q12H  . thiamine IV  100 mg Intravenous Daily   Continuous Infusions: . dextrose 5 % and 0.9% NaCl 50 mL/hr at 01/22/15 0630  . sodium chloride 0.9 % 1,000 mL infusion 75 mL/hr at 01/19/15 2142    Time spent: 35 minutes  Cherice Glennie  [facility] [email or web address], password [facility] 01/22/2015, 7:44 AM  LOS: 6 days

## 2015-01-22 NOTE — Procedures (Signed)
ELECTROENCEPHALOGRAM REPORT  Patient: Harold Martin       Room #: [id number] EEG No. ID: [id number] Age: 74 y.o.        Sex: male Referring Physician: Princella Ion Report Date:  01/22/2015        Interpreting Physician: Anthony Sar  History: [name] is an 74 y.o. male admitted on 01/16/2015 with altered mental status of unclear etiology. Patient has remained encephalopathic. CSF an MRI of been unremarkable. Previous EEG on 01/17/2015 showed specific generalized slowing.  Indications for study:  Assess severity of encephalopathy; rule out seizure activity.  Technique: This is an 18 channel routine scalp EEG performed at the bedside with bipolar and monopolar montages arranged in accordance to the international 10/20 system of electrode placement.   Description: This EEG recording was performed during wakefulness. Patient was noted to be confused at times study. Predominant activity consisted of diffuse moderate amplitude 1-2 Hz irregular delta activity with intermittent superimposed use beta activity. Photic stimulation produced no discernible occipital driving responses. Hyperventilation was not performed. No epileptiform discharges were recorded.  Interpretation: This EEG recording was abnormal with moderately severe generalized nonspecific T he was slowing of cerebral activity. This pattern of slowing can be seen with metabolic and toxic encephalopathies as well as degenerative and traumatic nervous system disorders. No evidence of epileptic activity was demonstrated.   Rush Farmer M.D. [facility] [phone]

## 2015-01-22 NOTE — Progress Notes (Signed)
Speech Language Pathology Treatment: Dysphagia  Patient Details Name: Harold Martin MRN: [id number] DOB: Dec 05, 1940 Today's Date: 01/22/2015 Time: [id number] SLP Time Calculation (min) (ACUTE ONLY): 20 min  Assessment / Plan / Recommendation Clinical Impression  Pt drowsy but able to remain awake with several verbal/tactile cues throughout session. Followed simple commands inconsistently with sometimes no response. Oral phase slow with delayed transit, multiple swallows due to likely pharyngeal residue. Immediate throat clear and delayed cough following thin via straw. Aspiration risk is currently high and recommend meds crushed in applesauce only at present with follow up for improvements to initiated po's.   HPI Other Pertinent Information: 74 year old male with a history of hypertension, diabetes mellitus, CKD stage II-III, hypothyroidism presents with one-day history of confusion. According to the patient's family at the bedside, the patient was his usual self on the night before admission. When the patient woke up on the morning of admission, the patient was noted to be confused. In addition, he's been unstable and his gait but has not fallen recently at home. There has been no recent fevers, chills, vomiting, but the patient endorses chronic loose stools for the past 2 months without any hematochezia or melena. The patient denies any chest pain, harsh breath, coughing, hemoptysis. Patient denies any recent antibiotics. He denies any headaches or visual disturbance or focal extremity weakness. There is no dysarthria.   Pertinent Vitals Pain Assessment: No/denies pain  SLP Plan  Continue with current plan of care    Recommendations Diet recommendations:  (NPO except meds) Medication Administration: Crushed with puree              Oral Care Recommendations: Oral care QID Follow up Recommendations:  (TBD) Plan: Continue with current plan of care    GO     Houston Siren 01/22/2015, 11:50 AM   Orbie Pyo Colvin Caroli.Ed [employer] [phone]

## 2015-01-22 NOTE — Progress Notes (Signed)
Subjective: Continues to be densely encephalopathic  Exam: Filed Vitals:   01/22/15 1200  BP: 154/75  Pulse: 80  Temp: 98.7 F (37.1 C)  Resp: 16   Gen: In bed, NAD Resp: non-labored breathing, no acute distress Abd: soft, nt  Neuro: MS: Awake, follows simple commands, answers some simple questions with head shakes/nods [id number], blinks to threa bilaterally.  Motor: MAEW Sensory:responds to stim x 4.   Pertinent Labs: Cortisol 12.3  Impression: 74 yo M with acute MS change of unclear etiology. LP effectively rules out infectious process, and MRI shows no sign of stroke. He has not been losing weight to suggest vitamin deficiency or had hypotension to suggest cortisol deficiency. At this time, I am still not sure what is causing him to be this encephalopathic. If he was more hypoxic at some point, this could give a picture similar to what we are seeing now, but will continue to exclude other causes.   Recommendations: 1) B1 2) EEG 3) will continue to follow.   Roland Rack, MD [facility] [phone]  If 7pm- 7am, please page neurology on call as listed in [place].

## 2015-01-22 NOTE — Progress Notes (Signed)
EEG completed at bedside, results pending. 

## 2015-01-22 NOTE — Care Management Important Message (Signed)
Important Message  Patient Details  Name: Harold Martin MRN: [id number] Date of Birth: 01/10/1941   Medicare Important Message Given:  Yes-third notification given    Pricilla Handler 01/22/2015, 2:21 PM

## 2015-01-22 NOTE — Care Management Note (Signed)
Case Management Note  Patient Details  Name: Harold Martin MRN: [id number] Date of Birth: Sep 29, 1940  Subjective/Objective:       CM talked with dtr, Harold Martin [phone]).  Spouse is currently @ [facility] due to alzheimer's dementia.  PT recommends SNF for rehab when pt medically stable for discharge and [facility] requests placement @ [facility].  Will notify CSW.                       Expected Discharge Plan:  [facility] (Patient lives at home with wife)  In-House Referral:  Clinical Social Work  Status of Service:  In process, will continue to follow  Medicare Important Message Given:  Yes-third notification given  Girard Cooter, RN 01/22/2015, 3:23 PM

## 2015-01-22 NOTE — Progress Notes (Signed)
Patient spontaneously woke up and attempted to get OOB without any assistance. Patient noted very awake, alert and oriented. Patient denied pain or discomfort. Patient request "something to eat". Patient passed nurse driven swallowing eval. Dr. Eliseo Squires phoned and updated.

## 2015-01-22 NOTE — Progress Notes (Signed)
Physical Therapy Treatment Patient Details Name: Harold Martin MRN: [id number] DOB: August 11, 1940 Today's Date: 01/22/2015    History of Present Illness 74 year old male with a history of hypertension, diabetes mellitus, CKD stage II-III, hypothyroidism presents with one-day history of confusion. Admitted for acute encaphalopathy.    PT Comments    Pt's mobility has been set back by change in status, though respiratory status remained stable throughout treatment. Pt with generalized weakness and inability to follow commands consistently. Was unable to ambulate safely, worked on sit to stand and pregait activities. Pt's mental status fluctuated throughout session.    Follow Up Recommendations  SNF;Supervision/Assistance - 24 hour     Equipment Recommendations  None recommended by PT    Recommendations for Other Services OT consult     Precautions / Restrictions Precautions Precautions: Fall Precaution Comments: monitor O2 Restrictions Weight Bearing Restrictions: No    Mobility  Bed Mobility Overal bed mobility: Needs Assistance Bed Mobility: Supine to Sit;Sit to Supine     Supine to sit: Min assist Sit to supine: +2 for physical assistance;Mod assist   General bed mobility comments: pt did not initiate sup to sit on command, required min A to bridge knees and initiate rolling. But once mobility was started, he initiated SL to sit and achieved with min A. The task of returning to supine was more confusing for him and he required mod A +2 to lower trunk to bed and get legs into bed.   Transfers Overall transfer level: Needs assistance Equipment used: 2 person hand held assist Transfers: Sit to/from Stand Sit to Stand: Mod assist;+2 physical assistance         General transfer comment: pt with posterior lean, mod A +2 for power up and to maintain standing as posterior lean persisted. Attempted pivot to chair but pt unable to step feet and impulsively sitting back down.  Performed 3 trials.   Ambulation/Gait             General Gait Details: unable to do so safely today even with +2 assist. Seemed confused by the task of stepping and generalized weakness noted bilateral LE's   Stairs            Wheelchair Mobility    Modified Rankin (Stroke Patients Only)       Balance Overall balance assessment: Needs assistance Sitting-balance support: No upper extremity supported;Feet supported Sitting balance-Leahy Scale: Fair Sitting balance - Comments: close supervision needed in sitting due to impulsivity Postural control: Posterior lean Standing balance support: Bilateral upper extremity supported Standing balance-Leahy Scale: Poor Standing balance comment: posterior lean in standing, could not maintain without mod A +2                    Cognition Arousal/Alertness: Awake/alert Behavior During Therapy: Restless;Impulsive Overall Cognitive Status: Impaired/Different from baseline Area of Impairment: Orientation;Following commands;Problem solving;Safety/judgement Orientation Level: Disoriented to;Place;Time;Situation   Memory: Decreased short-term memory;Decreased recall of precautions Following Commands: Follows one step commands inconsistently;Follows one step commands with increased time Safety/Judgement: Decreased awareness of safety;Decreased awareness of deficits   Problem Solving: Slow processing;Requires verbal cues;Difficulty sequencing;Requires tactile cues;Decreased initiation General Comments: at times pt will say "ok" to a command but will not physically intiate the follow through. Mentation fluctuated throughout session    Exercises      General Comments General comments (skin integrity, edema, etc.): O2 sats remained 97% on RA, O2 replaced after session      Pertinent Vitals/Pain Pain Assessment: No/denies pain  Home Living                      Prior Function            PT Goals (current goals  can now be found in the care plan section) Acute Rehab PT Goals Patient Stated Goal: none stated PT Goal Formulation: With patient Time For Goal Achievement: 01/31/15 Potential to Achieve Goals: Good Progress towards PT goals: Progressing toward goals    Frequency  Min 2X/week    PT Plan Discharge plan needs to be updated    Co-evaluation             End of Session Equipment Utilized During Treatment: Gait belt Activity Tolerance: Patient tolerated treatment well Patient left: in bed;with call bell/phone within reach;with bed alarm set     Time: 1213-1238 PT Time Calculation (min) (ACUTE ONLY): 25 min  Charges:  $Therapeutic Activity: 23-37 mins                    G Codes:     Leighton Roach, PT  Acute Rehab Services  [phone]  Leighton Roach 01/22/2015, 2:15 PM

## 2015-01-23 LAB — GLUCOSE, CAPILLARY
GLUCOSE-CAPILLARY: 168 mg/dL — AB (ref 65–99)
GLUCOSE-CAPILLARY: 168 mg/dL — AB (ref 65–99)
GLUCOSE-CAPILLARY: 177 mg/dL — AB (ref 65–99)
GLUCOSE-CAPILLARY: 179 mg/dL — AB (ref 65–99)
GLUCOSE-CAPILLARY: 185 mg/dL — AB (ref 65–99)
GLUCOSE-CAPILLARY: 194 mg/dL — AB (ref 65–99)
GLUCOSE-CAPILLARY: 280 mg/dL — AB (ref 65–99)

## 2015-01-23 LAB — CSF CULTURE: Culture: NO GROWTH

## 2015-01-23 LAB — CSF CULTURE W GRAM STAIN

## 2015-01-23 MED ORDER — IPRATROPIUM-ALBUTEROL 0.5-2.5 (3) MG/3ML IN SOLN
3.0000 mL | Freq: Four times a day (QID) | RESPIRATORY_TRACT | Status: DC
  Administered 2015-01-23 (×3): 3 mL via RESPIRATORY_TRACT
  Filled 2015-01-23 (×3): qty 3

## 2015-01-23 MED ORDER — RESOURCE THICKENUP CLEAR PO POWD
ORAL | Status: DC | PRN
  Filled 2015-01-23: qty 125

## 2015-01-23 NOTE — Progress Notes (Signed)
[facility] PROGRESS NOTE  Harold Martin [id number] DOB: 10/07/1940 DOA: 01/16/2015 PCP: Harold Bolt, MD  Summary 74 year old white male with history of hypertension, diabetes, chronic diarrhea, COPD who presented with fairly acute onset confusion. Per family, no memory problems at baseline and very independent. Workup thus far significant for acute renal failure and respiratory acidosis. Respiratory acidosis has been improving. Patient was unable to tolerate BiPAP. Patient remains confused however.  Assessment/Plan:   Acute encephalopathy:  Likely CO2 retention on admission but ABG improved and patient remained confused.   - EEG without epileptiform activity.  -Ammonia level and other workup negative other than acute renal failure - Avoid sedatives as able except severe agitation.   -consulted neurology for recommendations as etiology not entirely clear. Dr. Leonel Martin recommends stopping Lomotil in case anticholinergic effect.  LP done -patient is following commands today  Acute hypercarbic respiratory failure:  Secondary to COPD exacerbation. Placed on low dose O2    Acute renal failure superimposed on stage 3 chronic kidney disease secondary to diarrhea. resolved    Type 2 diabetes mellitus with hyperglycemia:  Stable, add SSI and some d5    Essential hypertension: Blood pressure creeping up. Will give IV metoprolol.  ACE inhibitor had been stopped due to renal failure.    Normocytic anemia chronic: No signs of bleeding. Monitor.    chronic diarrhea: Has been worked up extensively in the past. Celiac workup negative. C. difficile negative.   SLp to come back today   Code Status:  full Family Communication:  LM for family Disposition Plan:  Remain in SDU for now--- Likely SNF  HPI/Subjective: Awake, more alert- knows who he is  Objective: Filed Vitals:   01/23/15 0727  BP: 156/73  Pulse: 68  Temp:   Resp: 24    Intake/Output Summary (Last 24  hours) at 01/23/15 0752 Last data filed at 01/23/15 0600  Gross per 24 hour  Intake   1381 ml  Output    450 ml  Net    931 ml   Filed Weights   01/17/15 1400  Weight: 106.323 kg (234 lb 6.4 oz)    Exam:   General:  Awake, NAD  Cardiovascular: regular rate rhythm without murmurs gallops rubs   Respiratory:  Diminished throughout  Abdomen:  soft nontender nondistended   Ext: No clubbing cyanosis or edema  Neurologic: no focal defecits, following commands  Basic Metabolic Panel:  Recent Labs Lab 01/17/15 0515 01/18/15 0525 01/19/15 0335 01/20/15 0247 01/21/15 0325 01/22/15 0325  NA 137 135 136 134* 132* 133*  K 4.7 4.5 4.7 4.6 4.9 4.3  CL 98* 98* 101 100* 99* 102  CO2 31 29 29 27  21* 24  GLUCOSE 129* 162* 200* 168* 155* 165*  BUN 24* 23* 20 16 12 9   CREATININE 2.13* 1.90* 1.42* 1.34* 1.11 1.12  CALCIUM 7.8* 7.0* 7.6* 7.8* 8.0* 8.4*  MG 5.0*  --   --   --   --   --    Liver Function Tests:  Recent Labs Lab 01/16/15 1510 01/17/15 0515 01/22/15 0325  AST 18 18 14*  ALT 18 17 14*  ALKPHOS 92 81 76  BILITOT 0.6 0.4 0.2*  PROT 6.4* 6.2* 5.9*  ALBUMIN 3.5 3.3* 2.6*   No results for input(s): LIPASE, AMYLASE in the last 168 hours.  Recent Labs Lab 01/16/15 2046  AMMONIA 12   CBC:  Recent Labs Lab 01/16/15 1510  01/17/15 0515 01/18/15 [id number] 01/20/15 0247 01/21/15 0325 01/22/15 [id number]  WBC 8.5  --  9.5 7.4 8.8 10.4 9.6  NEUTROABS 6.7  --   --   --   --   --   --   HGB 9.0*  < > 8.9* 8.3* 9.0* 9.0* 9.2*  HCT 30.5*  < > 29.9* 28.5*  26.7* 30.0* 29.3* 30.6*  MCV 83.3  --  85.2 84.8 84.3 81.6 82.5  PLT 278  --  266 254 241 263 255  < > = values in this interval not displayed. Cardiac Enzymes: No results for input(s): CKTOTAL, CKMB, CKMBINDEX, TROPONINI in the last 168 hours. BNP (last 3 results)  Recent Labs  01/18/15 1035  BNP 200.6*    ProBNP (last 3 results) No results for input(s): PROBNP in the last 8760 hours.  CBG:  Recent  Labs Lab 01/22/15 1719 01/22/15 2004 01/22/15 2244 01/23/15 0034 01/23/15 0432  GLUCAP 159* 196* 161* 177* 168*    Recent Results (from the past 240 hour(s))  Clostridium Difficile by PCR (not at [facility])     Status: None   Collection Time: 01/17/15 10:00 AM  Result Value Ref Range Status   C difficile by pcr NEGATIVE NEGATIVE Final  Urine culture     Status: None   Collection Time: 01/17/15  1:50 PM  Result Value Ref Range Status   Specimen Description URINE, RANDOM  Final   Special Requests NONE  Final   Culture   Final    MULTIPLE SPECIES PRESENT, SUGGEST RECOLLECTION IF CLINICALLY INDICATED   Report Status 01/19/2015 FINAL  Final  CSF culture     Status: None (Preliminary result)   Collection Time: 01/20/15 12:52 PM  Result Value Ref Range Status   Specimen Description CSF  Final   Special Requests NONE  Final   Gram Stain CYTOSPIN SLIDE NO WBC SEEN NO ORGANISMS SEEN   Final   Culture NO GROWTH 2 DAYS  Final   Report Status PENDING  Incomplete  MRSA PCR Screening     Status: None   Collection Time: 01/22/15  5:13 PM  Result Value Ref Range Status   MRSA by PCR NEGATIVE NEGATIVE Final    Comment:        The GeneXpert MRSA Assay (FDA approved for NASAL specimens only), is one component of a comprehensive MRSA colonization surveillance program. It is not intended to diagnose MRSA infection nor to guide or monitor treatment for MRSA infections.      Studies: No results found.  Scheduled Meds: . antiseptic oral rinse  7 mL Mouth Rinse q12n4p  . azithromycin  500 mg Intravenous Q24H  . cefTRIAXone (ROCEPHIN)  IV  1 g Intravenous Q24H  . chlorhexidine  15 mL Mouth Rinse BID  . enoxaparin (LOVENOX) injection  40 mg Subcutaneous Q24H  . haloperidol lactate  5 mg Intravenous Once  . insulin aspart  0-15 Units Subcutaneous Q4H  . levothyroxine  75 mcg Intravenous QAC breakfast  . metoprolol  5 mg Intravenous 4 times per day  . sodium chloride  3 mL Intravenous  Q12H  . thiamine IV  100 mg Intravenous Daily   Continuous Infusions: . dextrose 5 % and 0.9% NaCl 50 mL/hr at 01/23/15 0242  . sodium chloride 0.9 % 1,000 mL infusion 75 mL/hr at 01/19/15 2142    Time spent: 35 minutes  Harold Martin  [facility] [email or web address], password [facility] 01/23/2015, 7:52 AM  LOS: 7 days

## 2015-01-23 NOTE — Progress Notes (Signed)
Speech Language Pathology Treatment: Dysphagia  Patient Details Name: Harold Martin MRN: [id number] DOB: 1940-08-26 Today's Date: 01/23/2015 Time: [id number] SLP Time Calculation (min) (ACUTE ONLY): 26 min  Assessment / Plan / Recommendation Clinical Impression  Pt more alert and responsive today; continues with mild-mod confusion and need for cueing. Consistent delayed coughs following thin liquids not present when thickened to nectar consistency. Delayed oral mastication and transit with solid texture. Swallow initiation appeared timely. Indications of pharyngeal residue due to multiple swallows. Recommend Dys 2 and nectar thick, crush pills, eat/drink when alert and full supervision and feeding assist. Continued ST for tolerance.    HPI Other Pertinent Information: 74 year old male with a history of hypertension, diabetes mellitus, CKD stage II-III, hypothyroidism presents with one-day history of confusion. According to the patient's family at the bedside, the patient was his usual self on the night before admission. When the patient woke up on the morning of admission, the patient was noted to be confused. In addition, he's been unstable and his gait but has not fallen recently at home. There has been no recent fevers, chills, vomiting, but the patient endorses chronic loose stools for the past 2 months without any hematochezia or melena. The patient denies any chest pain, harsh breath, coughing, hemoptysis. Patient denies any recent antibiotics. He denies any headaches or visual disturbance or focal extremity weakness. There is no dysarthria.   Pertinent Vitals Pain Assessment: No/denies pain  SLP Plan  Continue with current plan of care    Recommendations Diet recommendations: Dysphagia 2 (fine chop);Nectar-thick liquid Liquids provided via: Cup;No straw Medication Administration: Crushed with puree Supervision: Staff to assist with self feeding;Full supervision/cueing for compensatory  strategies Compensations: Slow rate;Small sips/bites;Check for pocketing Postural Changes and/or Swallow Maneuvers: Seated upright 90 degrees              Oral Care Recommendations: Oral care QID Follow up Recommendations:  (TBD) Plan: Continue with current plan of care    GO     Houston Siren 01/23/2015, 9:40 AM  Orbie Pyo Colvin Caroli.Ed [employer] [phone]

## 2015-01-23 NOTE — Progress Notes (Signed)
Subjective: Some improvement, though was better yesterday evening  Exam: Filed Vitals:   01/23/15 0806  BP:   Pulse:   Temp: 98.8 F (37.1 C)  Resp:    Gen: In bed, NAD Resp: non-labored breathing, no acute distress Abd: soft, nt  Neuro: MS: Awake, follows commands more briskly and answers more questions than yesterday.  [id number], blinks to threa bilaterally.  Motor: MAEW Sensory:responds to stim x 4.   Pertinent Labs:   Impression: 74 yo M with acute MS change of unclear etiology. LP effectively rules out infectious process, and MRI shows no sign of stroke. I suspect that he has a multifactorial delirium with hypoxia playing a significant role. He does seem to be slowly improving.   Recommendations: 1) B1 pending, continue supplementation.  2) will continue to follow.   Roland Rack, MD [facility] [phone]  If 7pm- 7am, please page neurology on call as listed in [place].

## 2015-01-24 ENCOUNTER — Inpatient Hospital Stay (HOSPITAL_COMMUNITY): Payer: Medicare Other

## 2015-01-24 LAB — GLUCOSE, CAPILLARY
GLUCOSE-CAPILLARY: 154 mg/dL — AB (ref 65–99)
GLUCOSE-CAPILLARY: 169 mg/dL — AB (ref 65–99)
GLUCOSE-CAPILLARY: 162 mg/dL — AB (ref 65–99)
Glucose-Capillary: 213 mg/dL — ABNORMAL HIGH (ref 65–99)
Glucose-Capillary: 241 mg/dL — ABNORMAL HIGH (ref 65–99)

## 2015-01-24 LAB — BASIC METABOLIC PANEL
Anion gap: 12 (ref 5–15)
BUN: 10 mg/dL (ref 6–20)
CHLORIDE: 100 mmol/L — AB (ref 101–111)
CO2: 23 mmol/L (ref 22–32)
Calcium: 8.7 mg/dL — ABNORMAL LOW (ref 8.9–10.3)
Creatinine, Ser: 1.06 mg/dL (ref 0.61–1.24)
GFR calc non Af Amer: >60 mL/min (ref >60)
Glucose, Bld: 170 mg/dL — ABNORMAL HIGH (ref 65–99)
Potassium: 4.3 mmol/L (ref 3.5–5.1)
SODIUM: 135 mmol/L (ref 135–145)

## 2015-01-24 LAB — VITAMIN B1: VITAMIN B1 (THIAMINE): 149.9 nmol/L (ref 66.5–200.0)

## 2015-01-24 LAB — CBC
HCT: 31.2 % — ABNORMAL LOW (ref 39.0–52.0)
HEMOGLOBIN: 9.6 g/dL — AB (ref 13.0–17.0)
MCH: 25.4 pg — ABNORMAL LOW (ref 26.0–34.0)
MCHC: 30.8 g/dL (ref 30.0–36.0)
MCV: 82.5 fL (ref 78.0–100.0)
PLATELETS: 229 10*3/uL (ref 150–400)
RBC: 3.78 MIL/uL — ABNORMAL LOW (ref 4.22–5.81)
RDW: 15.7 % — ABNORMAL HIGH (ref 11.5–15.5)
WBC: 8.8 10*3/uL (ref 4.0–10.5)

## 2015-01-24 MED ORDER — IPRATROPIUM-ALBUTEROL 0.5-2.5 (3) MG/3ML IN SOLN
3.0000 mL | Freq: Four times a day (QID) | RESPIRATORY_TRACT | Status: DC | PRN
  Administered 2015-01-24: 3 mL via RESPIRATORY_TRACT
  Filled 2015-01-24: qty 3

## 2015-01-24 MED ORDER — FUROSEMIDE 10 MG/ML IJ SOLN
20.0000 mg | Freq: Once | INTRAMUSCULAR | Status: AC
  Administered 2015-01-24: 20 mg via INTRAVENOUS
  Filled 2015-01-24: qty 4

## 2015-01-24 MED ORDER — INSULIN ASPART 100 UNIT/ML ~~LOC~~ SOLN
0.0000 [IU] | Freq: Three times a day (TID) | SUBCUTANEOUS | Status: DC
  Administered 2015-01-24: 3 [IU] via SUBCUTANEOUS
  Administered 2015-01-24 – 2015-01-25 (×3): 5 [IU] via SUBCUTANEOUS
  Administered 2015-01-25: 3 [IU] via SUBCUTANEOUS

## 2015-01-24 MED ORDER — LEVOTHYROXINE SODIUM 50 MCG PO TABS
150.0000 ug | ORAL_TABLET | Freq: Every day | ORAL | Status: DC
  Administered 2015-01-24 – 2015-01-25 (×2): 150 ug via ORAL
  Filled 2015-01-24 (×4): qty 1

## 2015-01-24 MED ORDER — METOPROLOL SUCCINATE ER 25 MG PO TB24
25.0000 mg | ORAL_TABLET | Freq: Every day | ORAL | Status: DC
  Administered 2015-01-24 – 2015-01-25 (×2): 25 mg via ORAL
  Filled 2015-01-24 (×2): qty 1

## 2015-01-24 MED ORDER — AMLODIPINE BESYLATE 5 MG PO TABS
5.0000 mg | ORAL_TABLET | Freq: Every morning | ORAL | Status: DC
  Administered 2015-01-24 – 2015-01-25 (×2): 5 mg via ORAL
  Filled 2015-01-24 (×2): qty 1

## 2015-01-24 MED ORDER — INSULIN ASPART 100 UNIT/ML ~~LOC~~ SOLN
0.0000 [IU] | Freq: Every day | SUBCUTANEOUS | Status: DC
  Administered 2015-01-24: 2 [IU] via SUBCUTANEOUS

## 2015-01-24 MED ORDER — TAMSULOSIN HCL 0.4 MG PO CAPS
0.4000 mg | ORAL_CAPSULE | Freq: Every day | ORAL | Status: DC
  Administered 2015-01-24 – 2015-01-25 (×2): 0.4 mg via ORAL
  Filled 2015-01-24 (×2): qty 1

## 2015-01-24 NOTE — Progress Notes (Signed)
[facility] PROGRESS NOTE  Harold Martin [id number] DOB: March 05, 1941 DOA: 01/16/2015 PCP: Dwan Bolt, MD  Summary 74 year old white male with history of hypertension, diabetes, chronic diarrhea, COPD who presented with fairly acute onset confusion. Per family, no memory problems at baseline and very independent. Workup thus far significant for acute renal failure and respiratory acidosis. Respiratory acidosis has been improving. Patient was unable to tolerate BiPAP. Patient remains confused however. More awake on 7/19 after O2 placed and was able to pass swallow evaluation.  Tx out of SDU on 7/20  Assessment/Plan:   Acute encephalopathy:  Likely CO2 retention on admission - EEG without epileptiform activity.  -Ammonia level and other workup negative other than acute renal failure - Avoid sedatives as able except severe agitation.   -neurology consultedfor recommendations as etiology not entirely clear. Dr. Leonel Ramsay recommends stopping Lomotil in case anticholinergic effect.  LP done -? Hypoxic event with continued hypoxemia-- improved with addition of O2  Acute hypercarbic respiratory failure:  Secondary to COPD exacerbation. Placed on low dose O2 -nebs scheduled    Acute renal failure superimposed on stage 3 chronic kidney disease secondary to diarrhea. resolved    Type 2 diabetes mellitus with hyperglycemia:  Stable -SSI    Essential hypertension:  -resume PO BP meds except ACE    Normocytic anemia chronic: No signs of bleeding. Monitor.    chronic diarrhea: Has been worked up extensively in the past. Celiac workup negative. C. difficile negative.      Code Status:  full Family Communication:  LM for family Disposition Plan:  tx to floor and then SNF 1-2 days  HPI/Subjective: Following commands- "I'm hungry"  Objective: Filed Vitals:   01/24/15 0733  BP: 178/69  Pulse: 62  Temp: 97.8 F (36.6 C)  Resp: 24    Intake/Output Summary (Last 24  hours) at 01/24/15 0753 Last data filed at 01/24/15 0600  Gross per 24 hour  Intake   2878 ml  Output   1700 ml  Net   1178 ml   Filed Weights   01/17/15 1400  Weight: 106.323 kg (234 lb 6.4 oz)    Exam:   General:  Awake, NAD- appears very hard of hearing  Cardiovascular: regular rate rhythm without murmurs gallops rubs   Respiratory:  Diminished throughout  Abdomen:  soft nontender nondistended   Ext: No clubbing cyanosis or edema  Neurologic: no focal defecits, following commands  Basic Metabolic Panel:  Recent Labs Lab 01/19/15 0335 01/20/15 0247 01/21/15 0325 01/22/15 0325 01/24/15 0238  NA 136 134* 132* 133* 135  K 4.7 4.6 4.9 4.3 4.3  CL 101 100* 99* 102 100*  CO2 29 27 21* 24 23  GLUCOSE 200* 168* 155* 165* 170*  BUN 20 16 12 9 10   CREATININE 1.42* 1.34* 1.11 1.12 1.06  CALCIUM 7.6* 7.8* 8.0* 8.4* 8.7*   Liver Function Tests:  Recent Labs Lab 01/22/15 0325  AST 14*  ALT 14*  ALKPHOS 76  BILITOT 0.2*  PROT 5.9*  ALBUMIN 2.6*   No results for input(s): LIPASE, AMYLASE in the last 168 hours. No results for input(s): AMMONIA in the last 168 hours. CBC:  Recent Labs Lab 01/18/15 0525 01/20/15 0247 01/21/15 0325 01/22/15 0325 01/24/15 0238  WBC 7.4 8.8 10.4 9.6 8.8  HGB 8.3* 9.0* 9.0* 9.2* 9.6*  HCT 28.5*  26.7* 30.0* 29.3* 30.6* 31.2*  MCV 84.8 84.3 81.6 82.5 82.5  PLT 254 241 263 255 229   Cardiac Enzymes: No results for  input(s): CKTOTAL, CKMB, CKMBINDEX, TROPONINI in the last 168 hours. BNP (last 3 results)  Recent Labs  01/18/15 1035  BNP 200.6*    ProBNP (last 3 results) No results for input(s): PROBNP in the last 8760 hours.  CBG:  Recent Labs Lab 01/23/15 1232 01/23/15 1528 01/23/15 1939 01/23/15 2312 01/24/15 0325  GLUCAP 280* 194* 179* 185* 154*    Recent Results (from the past 240 hour(s))  Clostridium Difficile by PCR (not at [facility])     Status: None   Collection Time: 01/17/15 10:00 AM  Result Value  Ref Range Status   C difficile by pcr NEGATIVE NEGATIVE Final  Urine culture     Status: None   Collection Time: 01/17/15  1:50 PM  Result Value Ref Range Status   Specimen Description URINE, RANDOM  Final   Special Requests NONE  Final   Culture   Final    MULTIPLE SPECIES PRESENT, SUGGEST RECOLLECTION IF CLINICALLY INDICATED   Report Status 01/19/2015 FINAL  Final  CSF culture     Status: None   Collection Time: 01/20/15 12:52 PM  Result Value Ref Range Status   Specimen Description CSF  Final   Special Requests NONE  Final   Gram Stain CYTOSPIN SLIDE NO WBC SEEN NO ORGANISMS SEEN   Final   Culture NO GROWTH 3 DAYS  Final   Report Status 01/23/2015 FINAL  Final  MRSA PCR Screening     Status: None   Collection Time: 01/22/15  5:13 PM  Result Value Ref Range Status   MRSA by PCR NEGATIVE NEGATIVE Final    Comment:        The GeneXpert MRSA Assay (FDA approved for NASAL specimens only), is one component of a comprehensive MRSA colonization surveillance program. It is not intended to diagnose MRSA infection nor to guide or monitor treatment for MRSA infections.      Studies: No results found.  Scheduled Meds: . amLODipine  5 mg Oral q morning - 10a  . antiseptic oral rinse  7 mL Mouth Rinse q12n4p  . azithromycin  500 mg Intravenous Q24H  . cefTRIAXone (ROCEPHIN)  IV  1 g Intravenous Q24H  . chlorhexidine  15 mL Mouth Rinse BID  . enoxaparin (LOVENOX) injection  40 mg Subcutaneous Q24H  . haloperidol lactate  5 mg Intravenous Once  . insulin aspart  0-15 Units Subcutaneous Q4H  . levothyroxine  150 mcg Oral QAC breakfast  . metoprolol succinate  25 mg Oral Q1200  . sodium chloride  3 mL Intravenous Q12H  . tamsulosin  0.4 mg Oral Q1200   Continuous Infusions: . sodium chloride 0.9 % 1,000 mL infusion 75 mL/hr at 01/23/15 1849    Time spent: 25 minutes  Katrena Stehlin  [facility] [email or web address], password [facility] 01/24/2015, 7:53 AM  LOS: 8 days

## 2015-01-24 NOTE — Clinical Social Work Note (Signed)
Patient transferred to 4N. Patient has a bed at [facility]. CSW to resubmit clinicals for insurance authorization.   Clinical Social Worker will continue to follow patient and pt's family for continued support and to facilitate patient's discharge needs once medically stable.   CSW remains available.   Glendon Axe, MSW, LCSWA [phone] 01/24/2015 2:24 PM

## 2015-01-24 NOTE — Progress Notes (Signed)
Subjective: No significant improvement since yesterday.   Exam: Filed Vitals:   01/24/15 0900  BP: 157/84  Pulse: 73  Temp:   Resp: 19    HEENT-  Normocephalic, no lesions, without obvious abnormality.  Normal external eye and conjunctiva.  Normal TM's bilaterally.  Normal auditory canals and external ears. Normal external nose, mucus membranes and septum.  Normal pharynx. Cardiovascular- S1, S2 normal, pulses palpable throughout   Lungs- chest clear, no wheezing, rales, normal symmetric air entry, Heart exam - S1, S2 normal, no murmur, no gallop, rate regular Abdomen- normal findings: bowel sounds normal Extremities- no edema Lymph-no adenopathy palpable Musculoskeletal-no joint tenderness, deformity or swelling Skin-warm and dry, no hyperpigmentation, vitiligo, or suspicious lesions    Gen: In bed, NAD MS: awake states he is at [facility] but then all other questions are not answered and he will look at me and laugh. Follows no verbal commands.  [id number], blinks to threat bilaterally, TML, Face symmetric Motor: MAEW Sensory:Responds to stimuli in all 4 extremities.    Pertinent Labs: Glucose 169 B1 149.9  Etta Quill PA-C [facility] [phone]  Impression: 74 yo M with acute MS change of unclear etiology. LP effectively rules out infectious process, and MRI shows no sign of stroke. I feel that this is most likely multifactorial delirium which now appears to be slowly improving. I do wonder if he had a prolonged relatively hypoxic period resulting in some injury in which case I would expect to see slow steady improvement.    Recommendations: 1) Continue supportive care.  2) will continue to follow.    Roland Rack, MD [facility] [phone]  If 7pm- 7am, please page neurology on call as listed in [place]. 01/24/2015, 9:56 AM

## 2015-01-24 NOTE — Progress Notes (Signed)
Speech Language Pathology Treatment: Dysphagia  Patient Details Name: Harold Martin MRN: [id number] DOB: 1940/08/05 Today's Date: 01/24/2015 Time: [id number] SLP Time Calculation (min) (ACUTE ONLY): 17 min  Assessment / Plan / Recommendation Clinical Impression  Pt today seen with meal tray at bedside, RN in room with pt.  RN reports pt requiring assistance to eat.  SlP provided pt with sausage, applesauce, nectar orange juice and water.  Subtle DRY cough noted x3 during intake - but also reported at baseline.  Pt also admits that nectar thickened liquid is easier to manage than thin.  Use of applesauce facilitated clearance of oral cavity.     Educated pt to recommendations using teach back and written cues with pt requiring max cues.  Recommend to continue diet with strict aspiration precautions.    HPI Other Pertinent Information: 74 year old male with a history of hypertension, diabetes mellitus, CKD stage II-III, hypothyroidism presents with one-day history of confusion. According to the patient's family at the bedside, the patient was his usual self on the night before admission. When the patient woke up on the morning of admission, the patient was noted to be confused. In addition, he's been unstable and his gait but has not fallen recently at home. There has been no recent fevers, chills, vomiting, but the patient endorses chronic loose stools for the past 2 months without any hematochezia or melena. The patient denies any chest pain, harsh breath, coughing, hemoptysis. Patient denies any recent antibiotics. He denies any headaches or visual disturbance or focal extremity weakness. There is no dysarthria.   Pertinent Vitals Pain Assessment: No/denies pain  SLP Plan  Continue with current plan of care    Recommendations Diet recommendations: Dysphagia 2 (fine chop);Nectar-thick liquid Liquids provided via: Cup;No straw Medication Administration: Crushed with puree Supervision: Staff to  assist with self feeding;Full supervision/cueing for compensatory strategies Compensations: Slow rate;Small sips/bites;Check for pocketing (use applesauce to faciliate clearance) Postural Changes and/or Swallow Maneuvers: Seated upright 90 degrees;Upright 30-60 min after meal              Oral Care Recommendations: Oral care QID Follow up Recommendations:  (TBD) Plan: Continue with current plan of care    [place], [place] [facility], [place] SLP [phone]

## 2015-01-25 LAB — GLUCOSE, CAPILLARY
GLUCOSE-CAPILLARY: 184 mg/dL — AB (ref 65–99)
Glucose-Capillary: 237 mg/dL — ABNORMAL HIGH (ref 65–99)
Glucose-Capillary: 232 mg/dL — ABNORMAL HIGH (ref 65–99)

## 2015-01-25 MED ORDER — ALBUTEROL SULFATE (2.5 MG/3ML) 0.083% IN NEBU: 2.5000 mg | INHALATION_SOLUTION | RESPIRATORY_TRACT | Status: DC | PRN

## 2015-01-25 MED ORDER — CHLORHEXIDINE GLUCONATE 0.12 % MT SOLN: 15.0000 mL | Freq: Two times a day (BID) | OROMUCOSAL | Status: AC

## 2015-01-25 MED ORDER — IPRATROPIUM-ALBUTEROL 0.5-2.5 (3) MG/3ML IN SOLN: 3.0000 mL | Freq: Four times a day (QID) | RESPIRATORY_TRACT | Status: DC | PRN

## 2015-01-25 MED ORDER — INSULIN ASPART 100 UNIT/ML ~~LOC~~ SOLN: 0.0000 [IU] | Freq: Every day | SUBCUTANEOUS | Status: DC

## 2015-01-25 MED ORDER — INSULIN ASPART 100 UNIT/ML ~~LOC~~ SOLN: 0.0000 [IU] | Freq: Three times a day (TID) | SUBCUTANEOUS | Status: DC

## 2015-01-25 MED ORDER — RESOURCE THICKENUP CLEAR PO POWD: ORAL | Status: DC

## 2015-01-25 MED ORDER — INSULIN GLARGINE 100 UNIT/ML ~~LOC~~ SOLN: 10.0000 [IU] | Freq: Every day | SUBCUTANEOUS | Status: DC

## 2015-01-25 NOTE — Progress Notes (Signed)
Physical Therapy Treatment Patient Details Name: Harold Martin MRN: [id number] DOB: 08/01/1940 Today's Date: 01/25/2015    History of Present Illness 74 year old male with a history of hypertension, diabetes mellitus, CKD stage II-III, hypothyroidism presents with one-day history of confusion. Admitted for acute encaphalopathy.    PT Comments    Progressing towards PT goals, ambulating with frequent assist for balance and walker control, +2 assistance for safety. SpO2 94% on room air during therapy session. Still confused and with decreased safety awareness. Patient will continue to benefit from skilled physical therapy services to further improve independence with functional mobility.   Follow Up Recommendations  SNF;Supervision/Assistance - 24 hour     Equipment Recommendations  None recommended by PT    Recommendations for Other Services OT consult     Precautions / Restrictions Precautions Precautions: Fall Precaution Comments: monitor O2 Restrictions Weight Bearing Restrictions: No    Mobility  Bed Mobility Overal bed mobility: Needs Assistance Bed Mobility: Supine to Sit     Supine to sit: Min assist;+2 for physical assistance     General bed mobility comments: Min assist to facilitate LEs off of bed and to allow pt to pull through PTs hand to sit upright.  Transfers Overall transfer level: Needs assistance Equipment used: Rolling walker (2 wheeled) Transfers: Sit to/from Stand Sit to Stand: +2 physical assistance;Min assist         General transfer comment: Min assist +2 for boost from lowest bed setting. VC for hand placement and tactile cues to initiate anterior flexion. Min assist +1 when performed from recliner. Cues to uncross legs prior to standing.  Ambulation/Gait Ambulation/Gait assistance: Min assist;+2 safety/equipment Ambulation Distance (Feet): 65 Feet Assistive device: Rolling walker (2 wheeled) Gait Pattern/deviations: Step-through  pattern;Decreased stride length;Trunk flexed;Narrow base of support;Drifts right/left Gait velocity: slow Gait velocity interpretation: Below normal speed for age/gender General Gait Details: Very easily distracted requiring frequent redirection to task at hand. Min assist for balance and walker control at all times. No buckling noted however. +2 for chair follow. SpO2 94% on room air.   Stairs            Wheelchair Mobility    Modified Rankin (Stroke Patients Only)       Balance                                    Cognition Arousal/Alertness: Awake/alert Behavior During Therapy: Flat affect Overall Cognitive Status: Impaired/Different from baseline Area of Impairment: Orientation;Following commands;Problem solving;Safety/judgement Orientation Level: Disoriented to;Time;Situation (disoriented to month and situation)   Memory: Decreased short-term memory;Decreased recall of precautions Following Commands: Follows one step commands inconsistently;Follows one step commands with increased time Safety/Judgement: Decreased awareness of safety;Decreased awareness of deficits   Problem Solving: Slow processing;Requires verbal cues;Difficulty sequencing;Requires tactile cues;Decreased initiation General Comments: Frequent cues for direction    Exercises      General Comments General comments (skin integrity, edema, etc.): SpO2 94% on room air throughout therapy session.. At end of therapy session, Pt reported feeling "swimmy-headed" VSS including BP - stated he felt that his blood sugar was low. RN notified.      Pertinent Vitals/Pain Pain Assessment: No/denies pain    Home Living                      Prior Function            PT Goals (current  goals can now be found in the care plan section) Acute Rehab PT Goals Patient Stated Goal: none stated PT Goal Formulation: With patient Time For Goal Achievement: 01/31/15 Potential to Achieve Goals:  Good Progress towards PT goals: Progressing toward goals    Frequency  Min 2X/week    PT Plan Current plan remains appropriate    Co-evaluation             End of Session Equipment Utilized During Treatment: Gait belt Activity Tolerance: Patient tolerated treatment well Patient left: with call bell/phone within reach;in chair;with chair alarm set     Time: [id number] PT Time Calculation (min) (ACUTE ONLY): 27 min  Charges:  $Gait Training: 8-22 mins $Therapeutic Activity: 8-22 mins                    G Codes:      Ellouise Newer 02-21-2015, 10:48 AM Elayne Snare, [place]

## 2015-01-25 NOTE — Clinical Social Work Note (Signed)
Clinical Social Worker informed by [employer] representative that [employer] is not in contract with [facility]. CSW contacted patient's dtr, [name] with updates. Dtr chooses bed at [facility]. CSW has re-faxed clinicals and contacted [facility] and left a voice message for a returned phone call.   [place] to authorize [employer] Medicare for short-term SNF stay today.   CSW will await additional bed offers.   Glendon Axe, MSW, LCSWA [phone] 01/25/2015 1:01 PM

## 2015-01-25 NOTE — Progress Notes (Addendum)
Subjective: No complaints.  Continues to improve.   Exam: Filed Vitals:   01/25/15 0515  BP: 157/74  Pulse: 76  Temp: 98.7 F (37.1 C)  Resp: 22    HEENT-  Normocephalic, no lesions, without obvious abnormality.  Normal external eye and conjunctiva.  Normal TM's bilaterally.  Normal auditory canals and external ears. Normal external nose, mucus membranes and septum.  Normal pharynx. Cardiovascular- S1, S2 normal, pulses palpable throughout   Lungs- chest clear, no wheezing, rales,  Abdomen- normal findings: bowel sounds normal   Gen: In bed, NAD MS: patient is awake and alert, he knows it is 2016 and he is in [facility]. He is unable to tell me the month.  Speech is fluent and clear. Able to follow commands.  CN: PERRLA, EOMI, TML, face symmetric.  Motor: MAEW and antigravity Sensory: intact throughout   Pertinent Labs: Glucose 170   Impression: 74 yo M with acute MS change of unclear etiology. LP effectively rules out infectious process, and MRI shows no sign of stroke. Most likely multifactorial delirium which now appears to be slowly improving. I do wonder if he had a prolonged relatively hypoxic period resulting in some injury in which case I would expect to see slow steady improvement   Recommendations: 1) Continue supportive care.  2) Please call with any further questions or concerns.    Etta Quill PA-C [facility] [phone]  01/25/2015, 9:30 AM   I have seen and evaluated the patient. I have reviewed the above note and made appropriate changes.   He is improving, I suspect some degree of hypoxic injury contributing to delirium.  He certainly seems to be headed in the right direction at this point, however.   Please call with any further questions or concerns.   Roland Rack, MD [facility] [phone]  If 7pm- 7am, please page neurology on call as listed in [place].

## 2015-01-25 NOTE — Clinical Social Work Note (Signed)
Clinical Social Worker has Probation officer for [employer] authorization for [employer] and [employer].  CSW remains available as needed.  Glendon Axe, MSW, LCSWA [phone] 01/25/2015 11:27 AM

## 2015-01-25 NOTE — Progress Notes (Signed)
Inpatient Diabetes Program Recommendations  AACE/ADA: New Consensus Statement on Inpatient Glycemic Control (2013)  Target Ranges:  Prepandial:   less than 140 mg/dL      Peak postprandial:   less than 180 mg/dL (1-2 hours)      Critically ill patients:  140 - 180 mg/dL   Results for NEDAL, SABORI (MRN [id number]) as of 01/25/2015 09:39  Ref. Range 01/24/2015 07:32 01/24/2015 12:09 01/24/2015 16:19 01/24/2015 21:54 01/25/2015 06:33  Glucose-Capillary Latest Ref Range: 65-99 mg/dL 169 (H) 213 (H) 162 (H) 241 (H) 184 (H)  Results for MILLION, GIANNAKOPOULOS (MRN [id number]) as of 01/25/2015 09:39  Ref. Range 01/17/2015 05:15  Hemoglobin A1C Latest Ref Range: 4.8-5.6 % 8.3 (H)    Diabetes history: Type 2 diabetes Outpatient Diabetes medications: Glucotrol 10 mg daily, Januvia 100 mg daily Current orders for Inpatient glycemic control:  Novolog moderate tid with meals and HS  Consider adding Lantus 15 units daily.    Thanks, Adah Perl, RN, BC-ADM Inpatient Diabetes Coordinator Pager [phone] (8a-5p)

## 2015-01-25 NOTE — Care Management Important Message (Signed)
Important Message  Patient Details  Name: Harold Martin MRN: [id number] Date of Birth: 1940-10-25   Medicare Important Message Given:  Yes-fourth notification given    Pricilla Handler 01/25/2015, 1:45 PM

## 2015-01-25 NOTE — Clinical Social Work Placement (Signed)
   CLINICAL SOCIAL WORK PLACEMENT  NOTE  Date:  01/25/2015  Patient Details  Name: Harold Martin MRN: [id number] Date of Birth: 04-Feb-1941  Clinical Social Work is seeking post-discharge placement for this patient at the [place] level of care (*CSW will initial, date and re-position this form in  chart as items are completed):  Yes   Patient/family provided with [facility] Work Department's list of facilities offering this level of care within the geographic area requested by the patient (or if unable, by the patient's family).  Yes   Patient/family informed of their freedom to choose among providers that offer the needed level of care, that participate in Medicare, Medicaid or managed care program needed by the patient, have an available bed and are willing to accept the patient.  Yes   Patient/family informed of [facility]'s ownership interest in [facility] and [facility], as well as of the fact that they are under no obligation to receive care at these facilities.  PASRR submitted to EDS on       PASRR number received on       Existing PASRR number confirmed on 01/19/15     FL2 transmitted to all facilities in geographic area requested by pt/family on 01/19/15     FL2 transmitted to all facilities within larger geographic area on       Patient informed that his/her managed care company has contracts with or will negotiate with certain facilities, including the following:        Yes   Patient/family informed of bed offers received.  Patient chooses bed at [facility] and [place] recommends and patient chooses bed at      Patient to be transferred to [facility] on 01/25/15.  Patient to be transferred to facility by [facility]      Patient family notified on 01/25/15 of transfer.  Name of family member notified:  [name],[name] daughter      PHYSICIAN       Additional Comment:     _______________________________________________ Greta Doom, LCSW 01/25/2015, 2:30 PM

## 2015-01-25 NOTE — Progress Notes (Signed)
Pt left via stretcher with EMS to [facility]. Vitals and assessment stable at discharge.

## 2015-01-25 NOTE — Progress Notes (Signed)
Patient is being d/c to a nursing facility, attempted multiple times to call report to the receiving facility but the phone kept ringing without answer. Number called was [phone].

## 2015-01-25 NOTE — Discharge Summary (Signed)
Physician Discharge Summary  Harold Martin [id number] DOB: September 26, 1940 DOA: 01/16/2015  PCP: Dwan Bolt, MD  Admit date: 01/16/2015 Discharge date: 01/25/2015  Time spent: 35 minutes  Recommendations for Outpatient Follow-up:  1. Continue 1L O2 2. DYS diet 3. To SNF 4. Monitor blood sugars  Discharge Diagnoses:  Principal Problem:   Acute encephalopathy Active Problems:   Acute renal failure superimposed on stage 3 chronic kidney disease   Type 2 diabetes mellitus with hyperglycemia   Essential hypertension   Normocytic anemia   COPD exacerbation   Acute respiratory failure with hypercapnia   Swallowing difficulty   Discharge Condition: improved  Diet recommendation: DYS 2 nectar thick- diabetic diet  Filed Weights   01/17/15 1400  Weight: 106.323 kg (234 lb 6.4 oz)    History of present illness:  74 year old male with a history of hypertension, diabetes mellitus, CKD stage II-III, hypothyroidism presents with one-day history of confusion. According to the patient's family at the bedside, the patient was his usual self on the night before admission. When the patient woke up on the morning of admission, the patient was noted to be confused. In addition, he's been unstable and his gait but has not fallen recently at home. There has been no recent fevers, chills, vomiting, but the patient endorses chronic loose stools for the past 2 months without any hematochezia or melena. The patient denies any chest pain, harsh breath, coughing, hemoptysis. Patient denies any recent antibiotics. He denies any headaches or visual disturbance or focal extremity weakness. There is no dysarthria. In the emergency department, patient was noted to have a serum creatinine of 2.3 to a potassium of 5.0. WBC was 8.5 with hemoglobin 9.0. INR was 1.04. Chest x-ray was negative. CT of the brain was unremarkable  Hospital Course:  Acute encephalopathy: Likely CO2 retention on admission - EEG  without epileptiform activity.  -Ammonia level and other workup negative other than acute renal failure - Avoid sedatives.  -neurology consultedfor recommendations as etiology not entirely clear. Dr. Leonel Ramsay recommends stopping Lomotil in case anticholinergic effect. LP done -? Hypoxic event with continued hypoxemia-- improved with addition of O2  Acute hypercarbic respiratory failure: Secondary to COPD exacerbation. Placed on low dose O2 -nebs scheduled   Acute renal failure superimposed on stage 3 chronic kidney disease secondary to diarrhea. resolved   Type 2 diabetes mellitus with hyperglycemia: Stable -SSI   Essential hypertension:  -resume PO BP meds except ACE   Normocytic anemia chronic: No signs of bleeding. Monitor.   chronic diarrhea: Has been worked up extensively in the past. Celiac workup negative. C. difficile negative  Procedures:    Consultations:  neurology  Discharge Exam: Filed Vitals:   01/25/15 0949  BP: 132/65  Pulse: 84  Temp: 98.5 F (36.9 C)  Resp: 20    General: pleasant/cooperative   Discharge Instructions   Discharge Instructions    Discharge instructions    Complete by:  As directed   DYS 2 nectar thick 1L O2     Increase activity slowly    Complete by:  As directed           Current Discharge Medication List    START taking these medications   Details  albuterol (PROVENTIL) (2.5 MG/3ML) 0.083% nebulizer solution Take 3 mLs (2.5 mg total) by nebulization every 2 (two) hours as needed for wheezing or shortness of breath. Qty: 75 mL, Refills: 12    chlorhexidine (PERIDEX) 0.12 % solution 15 mLs by Mouth Rinse route  2 (two) times daily. Qty: 120 mL, Refills: 0    !! insulin aspart (NOVOLOG) 100 UNIT/ML injection Inject 0-15 Units into the skin 3 (three) times daily with meals. Qty: 10 mL, Refills: 11    !! insulin aspart (NOVOLOG) 100 UNIT/ML injection Inject 0-5 Units into the skin at bedtime. Qty: 10 mL,  Refills: 11    insulin glargine (LANTUS) 100 UNIT/ML injection Inject 0.1 mLs (10 Units total) into the skin at bedtime. Qty: 10 mL, Refills: 11    ipratropium-albuterol (DUONEB) 0.5-2.5 (3) MG/3ML SOLN Take 3 mLs by nebulization every 6 (six) hours as needed. Qty: 360 mL    Maltodextrin-Xanthan Gum ([place]) POWD As needed     !! - Potential duplicate medications found. Please discuss with provider.    CONTINUE these medications which have NOT CHANGED   Details  amLODipine (NORVASC) 5 MG tablet Take 5 mg by mouth every morning.    levothyroxine (SYNTHROID, LEVOTHROID) 150 MCG tablet Take 150 mcg by mouth daily before breakfast.    magnesium oxide (MAG-OX) 400 MG tablet Take 400 mg by mouth daily.    metoprolol succinate (TOPROL-XL) 25 MG 24 hr tablet Take 25 mg by mouth daily at 12 noon. Refills: 2    tamsulosin (FLOMAX) 0.4 MG CAPS capsule Take 0.4 mg by mouth daily at 12 noon. Refills: 2      STOP taking these medications     albuterol (PROVENTIL HFA;VENTOLIN HFA) 108 (90 BASE) MCG/ACT inhaler      diphenoxylate-atropine (LOMOTIL) 2.5-0.025 MG per tablet      glipiZIDE (GLUCOTROL) 10 MG tablet      ibuprofen (ADVIL,MOTRIN) 200 MG tablet      JANUVIA 100 MG tablet      lisinopril (PRINIVIL,ZESTRIL) 20 MG tablet      omeprazole (PRILOSEC) 20 MG capsule        Allergies  Allergen Reactions  . Prednisone     REACTION: Reaction not known      The results of significant diagnostics from this hospitalization (including imaging, microbiology, ancillary and laboratory) are listed below for reference.    Significant Diagnostic Studies: Dg Chest 2 View  01/16/2015   CLINICAL DATA:  Altered mental status for 1 day  EXAM: CHEST - 2 VIEW  COMPARISON:  09/09/14  FINDINGS: Cardiac shadow is stable. The lungs are clear bilaterally. No focal infiltrate is seen. No acute bony abnormality is noted.  IMPRESSION: No active disease.   Electronically Signed   By:  Inez Catalina M.D.   On: 01/16/2015 16:43   Ct Head Wo Contrast  01/16/2015   CLINICAL DATA:  Initial encounter for confusion and weakness since 6 a.m. this morning.  EXAM: CT HEAD WITHOUT CONTRAST  TECHNIQUE: Contiguous axial images were obtained from the base of the skull through the vertex without intravenous contrast.  COMPARISON:  Brain MRI from 09/11/2014.  Head CT from 09/09/2014.  FINDINGS: There is no evidence for acute hemorrhage, hydrocephalus, mass lesion, or abnormal extra-axial fluid collection. No definite CT evidence for acute infarction. Diffuse loss of parenchymal volume is consistent with atrophy. Patchy low attenuation in the deep hemispheric and periventricular white matter is nonspecific, but likely reflects chronic microvascular ischemic demyelination. The visualized paranasal sinuses and mastoid air cells are clear.  IMPRESSION: Stable. Atrophy with mild chronic small vessel white matter disease. No acute intracranial abnormality.   Electronically Signed   By: Misty Stanley M.D.   On: 01/16/2015 16:21   Mr Brain Wo Contrast  01/20/2015   CLINICAL DATA:  74 year old male with acute onset confusion, combative. Initial encounter. Hypertension, diabetes, COPD.  EXAM: MRI HEAD WITHOUT CONTRAST  TECHNIQUE: Multiplanar, multiecho pulse sequences of the brain and surrounding structures were obtained without intravenous contrast.  COMPARISON:  Head CT without contrast 01/16/2015. Brain MRI 09/11/2014 and earlier.  FINDINGS: Study is intermittently degraded by motion artifact despite repeated imaging attempts.  Major intracranial vascular flow voids are stable. No restricted diffusion to suggest acute infarction. No midline shift, mass effect, evidence of mass lesion, ventriculomegaly, extra-axial collection or acute intracranial hemorrhage. Cervicomedullary junction and pituitary are within normal limits.  Stable gray and white matter signal, with minimal to mild for age nonspecific cerebral  white matter T2 hyperintensity. No cortical encephalomalacia or chronic cerebral blood products identified.  Visible internal auditory structures appear normal. Trace left mastoid effusion is unchanged. Other paranasal sinuses and mastoids are stable and largely clear. Orbits soft tissues appear normal. Negative scalp soft tissues. Normal bone marrow signal. Stable visualized cervical spine, partially visible ACDF hardware artifact at C3.  IMPRESSION: No acute intracranial abnormality. Stable and largely unremarkable for age noncontrast MRI appearance of the brain.   Electronically Signed   By: Genevie Ann M.D.   On: 01/20/2015 10:59   Dg Chest Port 1 View  01/24/2015   CLINICAL DATA:  Chest congestion  EXAM: PORTABLE CHEST - 1 VIEW  COMPARISON:  01/20/2015  FINDINGS: Lungs are essentially clear. Mild right basilar opacity, likely atelectasis. No focal consolidation. No pleural effusion or pneumothorax.  The heart is top-normal in size.  IMPRESSION: No evidence of acute cardiopulmonary disease.   Electronically Signed   By: Julian Hy M.D.   On: 01/24/2015 18:34   Dg Chest Port 1 View  01/20/2015   CLINICAL DATA:  history of hypertension, diabetes, chronic diarrhea, COPD who presented with fairly acute onset confusion. Has respiratory acidosis and productive cough. Pt is confused and combative  EXAM: PORTABLE CHEST - 1 VIEW  COMPARISON:  None.  FINDINGS: The heart size and mediastinal contours are within normal limits. Both lungs are clear. No pleural effusion or pneumothorax. Bony thorax grossly intact.  IMPRESSION: No acute cardiopulmonary disease.   Electronically Signed   By: Lajean Manes M.D.   On: 01/20/2015 11:00   Dg Chest Port 1 View  01/18/2015   CLINICAL DATA:  Cough and congestion.  EXAM: PORTABLE CHEST - 1 VIEW  COMPARISON:  None.  FINDINGS: Mediastinum and hilar structures are normal. Cardiomegaly with normal pulmonary vascularity . Low lung volumes with mild right base subsegmental  atelectasis. Mild infiltrate cannot be excluded. No pleural effusion or pneumothorax . No acute bony abnormality.  IMPRESSION: 1. Low lung volumes with mild right base subsegmental atelectasis and or infiltrate. 2. Cardiomegaly.  No pulmonary venous congestion .   Electronically Signed   By: Marcello Moores  Register   On: 01/18/2015 09:16    Microbiology: Recent Results (from the past 240 hour(s))  Clostridium Difficile by PCR (not at [facility])     Status: None   Collection Time: 01/17/15 10:00 AM  Result Value Ref Range Status   C difficile by pcr NEGATIVE NEGATIVE Final  Urine culture     Status: None   Collection Time: 01/17/15  1:50 PM  Result Value Ref Range Status   Specimen Description URINE, RANDOM  Final   Special Requests NONE  Final   Culture   Final    MULTIPLE SPECIES PRESENT, SUGGEST RECOLLECTION IF CLINICALLY INDICATED  Report Status 01/19/2015 FINAL  Final  CSF culture     Status: None   Collection Time: 01/20/15 12:52 PM  Result Value Ref Range Status   Specimen Description CSF  Final   Special Requests NONE  Final   Gram Stain CYTOSPIN SLIDE NO WBC SEEN NO ORGANISMS SEEN   Final   Culture NO GROWTH 3 DAYS  Final   Report Status 01/23/2015 FINAL  Final  MRSA PCR Screening     Status: None   Collection Time: 01/22/15  5:13 PM  Result Value Ref Range Status   MRSA by PCR NEGATIVE NEGATIVE Final    Comment:        The GeneXpert MRSA Assay (FDA approved for NASAL specimens only), is one component of a comprehensive MRSA colonization surveillance program. It is not intended to diagnose MRSA infection nor to guide or monitor treatment for MRSA infections.      Labs: Basic Metabolic Panel:  Recent Labs Lab 01/19/15 0335 01/20/15 0247 01/21/15 0325 01/22/15 0325 01/24/15 0238  NA 136 134* 132* 133* 135  K 4.7 4.6 4.9 4.3 4.3  CL 101 100* 99* 102 100*  CO2 29 27 21* 24 23  GLUCOSE 200* 168* 155* 165* 170*  BUN 20 16 12 9 10   CREATININE 1.42* 1.34* 1.11 1.12  1.06  CALCIUM 7.6* 7.8* 8.0* 8.4* 8.7*   Liver Function Tests:  Recent Labs Lab 01/22/15 0325  AST 14*  ALT 14*  ALKPHOS 76  BILITOT 0.2*  PROT 5.9*  ALBUMIN 2.6*   No results for input(s): LIPASE, AMYLASE in the last 168 hours. No results for input(s): AMMONIA in the last 168 hours. CBC:  Recent Labs Lab 01/20/15 0247 01/21/15 0325 01/22/15 0325 01/24/15 0238  WBC 8.8 10.4 9.6 8.8  HGB 9.0* 9.0* 9.2* 9.6*  HCT 30.0* 29.3* 30.6* 31.2*  MCV 84.3 81.6 82.5 82.5  PLT 241 263 255 229   Cardiac Enzymes: No results for input(s): CKTOTAL, CKMB, CKMBINDEX, TROPONINI in the last 168 hours. BNP: BNP (last 3 results)  Recent Labs  01/18/15 1035  BNP 200.6*    ProBNP (last 3 results) No results for input(s): PROBNP in the last 8760 hours.  CBG:  Recent Labs Lab 01/24/15 0732 01/24/15 1209 01/24/15 1619 01/24/15 2154 01/25/15 0633  GLUCAP 169* 213* 162* 241* 184*       Signed:  Daiden Coltrane  [facility] 01/25/2015, 11:24 AM

## 2015-02-24 ENCOUNTER — Encounter (HOSPITAL_COMMUNITY): Payer: Self-pay | Admitting: *Deleted

## 2015-02-24 ENCOUNTER — Emergency Department (HOSPITAL_COMMUNITY): Payer: Medicare Other

## 2015-02-24 ENCOUNTER — Observation Stay (HOSPITAL_COMMUNITY)
Admission: EM | Admit: 2015-02-24 | Discharge: 2015-02-27 | Disposition: A | Discharge status: Skilled Nursing Facility | Payer: Medicare Other | Attending: Internal Medicine | Admitting: Internal Medicine

## 2015-02-24 DIAGNOSIS — R410 Disorientation, unspecified: Secondary | ICD-10-CM | POA: Insufficient documentation

## 2015-02-24 DIAGNOSIS — R918 Other nonspecific abnormal finding of lung field: Secondary | ICD-10-CM | POA: Insufficient documentation

## 2015-02-24 DIAGNOSIS — Z720 Tobacco use: Secondary | ICD-10-CM | POA: Insufficient documentation

## 2015-02-24 DIAGNOSIS — N183 Chronic kidney disease, stage 3 unspecified: Secondary | ICD-10-CM | POA: Diagnosis present

## 2015-02-24 DIAGNOSIS — W19XXXA Unspecified fall, initial encounter: Secondary | ICD-10-CM | POA: Diagnosis not present

## 2015-02-24 DIAGNOSIS — J9811 Atelectasis: Secondary | ICD-10-CM | POA: Insufficient documentation

## 2015-02-24 DIAGNOSIS — Z7951 Long term (current) use of inhaled steroids: Secondary | ICD-10-CM | POA: Diagnosis not present

## 2015-02-24 DIAGNOSIS — R4182 Altered mental status, unspecified: Secondary | ICD-10-CM | POA: Insufficient documentation

## 2015-02-24 DIAGNOSIS — N179 Acute kidney failure, unspecified: Secondary | ICD-10-CM | POA: Insufficient documentation

## 2015-02-24 DIAGNOSIS — R05 Cough: Secondary | ICD-10-CM | POA: Insufficient documentation

## 2015-02-24 DIAGNOSIS — E1122 Type 2 diabetes mellitus with diabetic chronic kidney disease: Secondary | ICD-10-CM | POA: Insufficient documentation

## 2015-02-24 DIAGNOSIS — I639 Cerebral infarction, unspecified: Secondary | ICD-10-CM | POA: Diagnosis present

## 2015-02-24 DIAGNOSIS — Z794 Long term (current) use of insulin: Secondary | ICD-10-CM | POA: Insufficient documentation

## 2015-02-24 DIAGNOSIS — R1310 Dysphagia, unspecified: Secondary | ICD-10-CM | POA: Insufficient documentation

## 2015-02-24 DIAGNOSIS — Z79899 Other long term (current) drug therapy: Secondary | ICD-10-CM | POA: Insufficient documentation

## 2015-02-24 DIAGNOSIS — I129 Hypertensive chronic kidney disease with stage 1 through stage 4 chronic kidney disease, or unspecified chronic kidney disease: Secondary | ICD-10-CM | POA: Diagnosis not present

## 2015-02-24 DIAGNOSIS — S0990XA Unspecified injury of head, initial encounter: Secondary | ICD-10-CM | POA: Insufficient documentation

## 2015-02-24 DIAGNOSIS — R4781 Slurred speech: Secondary | ICD-10-CM | POA: Diagnosis not present

## 2015-02-24 DIAGNOSIS — Z8673 Personal history of transient ischemic attack (TIA), and cerebral infarction without residual deficits: Secondary | ICD-10-CM | POA: Insufficient documentation

## 2015-02-24 DIAGNOSIS — E039 Hypothyroidism, unspecified: Secondary | ICD-10-CM | POA: Insufficient documentation

## 2015-02-24 DIAGNOSIS — R471 Dysarthria and anarthria: Secondary | ICD-10-CM | POA: Insufficient documentation

## 2015-02-24 DIAGNOSIS — Z888 Allergy status to other drugs, medicaments and biological substances status: Secondary | ICD-10-CM | POA: Diagnosis not present

## 2015-02-24 DIAGNOSIS — R55 Syncope and collapse: Secondary | ICD-10-CM | POA: Insufficient documentation

## 2015-02-24 DIAGNOSIS — D72829 Elevated white blood cell count, unspecified: Secondary | ICD-10-CM | POA: Diagnosis present

## 2015-02-24 DIAGNOSIS — I4581 Long QT syndrome: Secondary | ICD-10-CM | POA: Diagnosis not present

## 2015-02-24 DIAGNOSIS — J440 Chronic obstructive pulmonary disease with acute lower respiratory infection: Secondary | ICD-10-CM | POA: Insufficient documentation

## 2015-02-24 DIAGNOSIS — G9340 Encephalopathy, unspecified: Secondary | ICD-10-CM | POA: Diagnosis not present

## 2015-02-24 DIAGNOSIS — Z7982 Long term (current) use of aspirin: Secondary | ICD-10-CM | POA: Diagnosis not present

## 2015-02-24 DIAGNOSIS — I6330 Cerebral infarction due to thrombosis of unspecified cerebral artery: Secondary | ICD-10-CM

## 2015-02-24 DIAGNOSIS — J209 Acute bronchitis, unspecified: Secondary | ICD-10-CM | POA: Diagnosis present

## 2015-02-24 DIAGNOSIS — I739 Peripheral vascular disease, unspecified: Secondary | ICD-10-CM | POA: Diagnosis not present

## 2015-02-24 DIAGNOSIS — J449 Chronic obstructive pulmonary disease, unspecified: Secondary | ICD-10-CM | POA: Diagnosis present

## 2015-02-24 LAB — I-STAT CHEM 8, ED
BUN: 20 mg/dL (ref 6–20)
CHLORIDE: 97 mmol/L — AB (ref 101–111)
Calcium, Ion: 1.12 mmol/L — ABNORMAL LOW (ref 1.13–1.30)
Creatinine, Ser: 1.7 mg/dL — ABNORMAL HIGH (ref 0.61–1.24)
Glucose, Bld: 184 mg/dL — ABNORMAL HIGH (ref 65–99)
HCT: 33 % — ABNORMAL LOW (ref 39.0–52.0)
Hemoglobin: 11.2 g/dL — ABNORMAL LOW (ref 13.0–17.0)
POTASSIUM: 4 mmol/L (ref 3.5–5.1)
SODIUM: 135 mmol/L (ref 135–145)
TCO2: 26 mmol/L (ref 0–100)

## 2015-02-24 LAB — ETHANOL: Alcohol, Ethyl (B): <5 mg/dL (ref <5)

## 2015-02-24 LAB — DIFFERENTIAL
BASOS ABS: 0 10*3/uL (ref 0.0–0.1)
Basophils Relative: 0 % (ref 0–1)
Eosinophils Absolute: 0.1 10*3/uL (ref 0.0–0.7)
Eosinophils Relative: 1 % (ref 0–5)
LYMPHS ABS: 0.7 10*3/uL (ref 0.7–4.0)
LYMPHS PCT: 6 % — AB (ref 12–46)
Monocytes Absolute: 1 10*3/uL (ref 0.1–1.0)
Monocytes Relative: 8 % (ref 3–12)
NEUTROS PCT: 85 % — AB (ref 43–77)
Neutro Abs: 10.9 10*3/uL — ABNORMAL HIGH (ref 1.7–7.7)

## 2015-02-24 LAB — COMPREHENSIVE METABOLIC PANEL
ALBUMIN: 3.8 g/dL (ref 3.5–5.0)
ALK PHOS: 107 U/L (ref 38–126)
ALT: 19 U/L (ref 17–63)
ANION GAP: 10 (ref 5–15)
AST: 22 U/L (ref 15–41)
BILIRUBIN TOTAL: 0.6 mg/dL (ref 0.3–1.2)
BUN: 17 mg/dL (ref 6–20)
CALCIUM: 9.4 mg/dL (ref 8.9–10.3)
CO2: 27 mmol/L (ref 22–32)
CREATININE: 1.68 mg/dL — AB (ref 0.61–1.24)
Chloride: 97 mmol/L — ABNORMAL LOW (ref 101–111)
GFR calc Af Amer: 45 mL/min — ABNORMAL LOW (ref >60)
GFR calc non Af Amer: 39 mL/min — ABNORMAL LOW (ref >60)
GLUCOSE: 183 mg/dL — AB (ref 65–99)
Potassium: 4.1 mmol/L (ref 3.5–5.1)
Sodium: 134 mmol/L — ABNORMAL LOW (ref 135–145)
Total Protein: 6.8 g/dL (ref 6.5–8.1)

## 2015-02-24 LAB — CBC
HCT: 33.5 % — ABNORMAL LOW (ref 39.0–52.0)
HEMOGLOBIN: 10.2 g/dL — AB (ref 13.0–17.0)
MCH: 25.1 pg — ABNORMAL LOW (ref 26.0–34.0)
MCHC: 30.4 g/dL (ref 30.0–36.0)
MCV: 82.5 fL (ref 78.0–100.0)
PLATELETS: 203 10*3/uL (ref 150–400)
RBC: 4.06 MIL/uL — AB (ref 4.22–5.81)
RDW: 15.3 % (ref 11.5–15.5)
WBC: 12.8 10*3/uL — ABNORMAL HIGH (ref 4.0–10.5)

## 2015-02-24 LAB — CBG MONITORING, ED: GLUCOSE-CAPILLARY: 164 mg/dL — AB (ref 65–99)

## 2015-02-24 LAB — APTT: aPTT: 27 seconds (ref 24–37)

## 2015-02-24 LAB — I-STAT TROPONIN, ED: Troponin i, poc: 0.01 ng/mL (ref 0.00–0.08)

## 2015-02-24 LAB — PROTIME-INR
INR: 1.09 (ref 0.00–1.49)
Prothrombin Time: 14.3 seconds (ref 11.6–15.2)

## 2015-02-24 MED ORDER — ASPIRIN EC 81 MG PO TBEC
81.0000 mg | DELAYED_RELEASE_TABLET | Freq: Every day | ORAL | Status: DC
  Filled 2015-02-24: qty 1

## 2015-02-24 MED ORDER — SODIUM CHLORIDE 0.9 % IV BOLUS (SEPSIS)
1000.0000 mL | Freq: Once | INTRAVENOUS | Status: AC
  Administered 2015-02-25: 1000 mL via INTRAVENOUS

## 2015-02-24 NOTE — Consult Note (Signed)
Referring Physician: ED    Chief Complaint: code stroke, dysarthria and altered mental status  HPI:                                                                                                                                         Harold Martin is an 74 y.o. male wit ha past medical history significant for HTN, DM, TIA, COPD, and hypothyroidism, brought in by EMS due to acute onset of the aforementioned symptoms. Patient reportedly had an inconsequential fall around 5 am this morning in which he hit his head but did not have loss of consciousness, went fishing with his granddaughter, then was noted to have " worsening slurred speech and changes in mental status" that worsened around 3 pm, and for unclear reason family waited until 10 pm to call ambulance. Upon arrival to the ED had NIHSS 2 for marked dysarthria.  CT brain head was personally reviewed and showed no acute abnormality. Presently he is alert and awake, oriented, at times looks confused following instructions but denies HA, vertigo, double vision, focal weakness, difficulty swallowing, imbalance, or visual impairment.  Available serologies were also reviewed and significant for Cr 1.68, wbc 12.8, ETOH <5. UDS and UA pending. EKG sinus rhythm.  Date last known well: 02/24/15 Time last known well: unable to determine tPA Given: no, late presentation NIHSS:  MRS:   Past Medical History  Diagnosis Date  . Hypertension   . Diabetes mellitus without complication   . Hypothyroidism   . Shortness of breath   . TIA (transient ischemic attack)   . COPD (chronic obstructive pulmonary disease)     Past Surgical History  Procedure Laterality Date  . Surgery for bladder cancer  yrs ago  . Esophagogastroduodenoscopy N/A 09/23/2013    Procedure: ESOPHAGOGASTRODUODENOSCOPY (EGD);  Surgeon: Beryle Beams, MD;  Location: [facility];  Service: Endoscopy;  Laterality: N/A;  . Colonoscopy N/A 09/23/2013    Procedure: COLONOSCOPY;   Surgeon: Beryle Beams, MD;  Location: [facility];  Service: Endoscopy;  Laterality: N/A;  . Givens capsule study N/A 10/07/2013    Procedure: GIVENS CAPSULE STUDY;  Surgeon: Beryle Beams, MD;  Location: [facility];  Service: Endoscopy;  Laterality: N/A;    History reviewed. No pertinent family history. Social History:  reports that he has never smoked. His smokeless tobacco use includes Chew. He reports that he does not drink alcohol or use illicit drugs. Family history: unable to obtain at this moment. Allergies:  Allergies  Allergen Reactions  . Prednisone     REACTION: Reaction not known    Medications:  I have reviewed the patient's current medications.  ROS:                                                                                                                                       History obtained from chart review and the patient  General ROS: negative for - chills, fatigue, fever, night sweats, weight gain or weight loss Psychological ROS: negative for - behavioral disorder, hallucinations, memory difficulties, mood swings or suicidal ideation Ophthalmic ROS: negative for - blurry vision, double vision, eye pain or loss of vision ENT ROS: negative for - epistaxis, nasal discharge, oral lesions, sore throat, tinnitus or vertigo Allergy and Immunology ROS: negative for - hives or itchy/watery eyes Hematological and Lymphatic ROS: negative for - bleeding problems, bruising or swollen lymph nodes Endocrine ROS: negative for - galactorrhea, hair pattern changes, polydipsia/polyuria or temperature intolerance Respiratory ROS: negative for - cough, hemoptysis, shortness of breath or wheezing Cardiovascular ROS: negative for - chest pain, dyspnea on exertion, edema or irregular heartbeat Gastrointestinal ROS: negative for - abdominal pain, diarrhea,  hematemesis, nausea/vomiting or stool incontinence Genito-Urinary ROS: negative for - dysuria, hematuria, incontinence or urinary frequency/urgency Musculoskeletal ROS: negative for - joint swelling or muscular weakness Neurological ROS: as noted in HPI Dermatological ROS: negative for rash and skin lesion changes   Physical exam:  Constitutional: well developed, pleasant male in no apparent distress. Blood pressure 153/75, pulse 96, resp. rate 24, height 5\' 11"  (1.803 m), weight 106.142 kg (234 lb), SpO2 96 %. Eyes: no jaundice or exophthalmos.  Head: normocephalic. Neck: supple, no bruits, no JVD. Cardiac: no murmurs. Lungs: clear. Abdomen: soft, no tender, no mass. Extremities: no edema, clubbing, or cyanosis.  Skin: no rash  Neurologic Examination:                                                                                                      General: Mental Status: Alert, oriented to place-month-year. Marked dysarthria without evidence of aphasia.  At times confused but able to follow 3 step commands without difficulty. Cranial Nerves: II: Discs flat bilaterally; Visual fields grossly normal, pupils equal, round, reactive to light and accommodation III,IV, VI: ptosis not present, extra-ocular motions intact bilaterally V,VII: smile symmetric, facial light touch sensation normal bilaterally VIII: hearing normal bilaterally IX,X: uvula rises symmetrically XI: bilateral shoulder shrug XII: midline tongue extension without atrophy or fasciculations Motor: Right : Upper extremity   5/5    Left:     Upper extremity  5/5  Lower extremity   5/5     Lower extremity   5/5 Tone and bulk:normal tone throughout; no atrophy noted Sensory: Pinprick and light touch intact throughout, bilaterally Deep Tendon Reflexes:  Right: Upper Extremity   Left: Upper extremity   biceps (C-5 to C-6) 2/4   biceps (C-5 to C-6) 2/4 tricep (C7) 2/4    triceps (C7) 2/4 Brachioradialis (C6)  2/4  Brachioradialis (C6) 2/4  Lower Extremity Lower Extremity  quadriceps (L-2 to L-4) 2/4   quadriceps (L-2 to L-4) 2/4 Achilles (S1) 2/4   Achilles (S1) 2/4  Plantars: Right: downgoing   Left: downgoing Cerebellar: normal finger-to-nose,  normal heel-to-shin test Gait:  No tested due to multiple leads.    Results for orders placed or performed during the hospital encounter of 02/24/15 (from the past 48 hour(s))  Protime-INR     Status: None   Collection Time: 02/24/15 10:37 PM  Result Value Ref Range   Prothrombin Time 14.3 11.6 - 15.2 seconds   INR 1.09 0.00 - 1.49  APTT     Status: None   Collection Time: 02/24/15 10:37 PM  Result Value Ref Range   aPTT 27 24 - 37 seconds  CBC     Status: Abnormal   Collection Time: 02/24/15 10:37 PM  Result Value Ref Range   WBC 12.8 (H) 4.0 - 10.5 K/uL   RBC 4.06 (L) 4.22 - 5.81 MIL/uL   Hemoglobin 10.2 (L) 13.0 - 17.0 g/dL   HCT 33.5 (L) 39.0 - 52.0 %   MCV 82.5 78.0 - 100.0 fL   MCH 25.1 (L) 26.0 - 34.0 pg   MCHC 30.4 30.0 - 36.0 g/dL   RDW 15.3 11.5 - 15.5 %   Platelets 203 150 - 400 K/uL  Differential     Status: Abnormal   Collection Time: 02/24/15 10:37 PM  Result Value Ref Range   Neutrophils Relative % 85 (H) 43 - 77 %   Neutro Abs 10.9 (H) 1.7 - 7.7 K/uL   Lymphocytes Relative 6 (L) 12 - 46 %   Lymphs Abs 0.7 0.7 - 4.0 K/uL   Monocytes Relative 8 3 - 12 %   Monocytes Absolute 1.0 0.1 - 1.0 K/uL   Eosinophils Relative 1 0 - 5 %   Eosinophils Absolute 0.1 0.0 - 0.7 K/uL   Basophils Relative 0 0 - 1 %   Basophils Absolute 0.0 0.0 - 0.1 K/uL  I-stat troponin, ED (not at [facility], [facility])     Status: None   Collection Time: 02/24/15 10:50 PM  Result Value Ref Range   Troponin i, poc 0.01 0.00 - 0.08 ng/mL   Comment 3            Comment: Due to the release kinetics of cTnI, a negative result within the first hours of the onset of symptoms does not rule out myocardial infarction with certainty. If myocardial infarction is  still suspected, repeat the test at appropriate intervals.   I-Stat Chem 8, ED  (not at [facility], [facility])     Status: Abnormal   Collection Time: 02/24/15 10:51 PM  Result Value Ref Range   Sodium 135 135 - 145 mmol/L   Potassium 4.0 3.5 - 5.1 mmol/L   Chloride 97 (L) 101 - 111 mmol/L   BUN 20 6 - 20 mg/dL   Creatinine, Ser 1.70 (H) 0.61 - 1.24 mg/dL   Glucose, Bld 184 (H) 65 - 99 mg/dL   Calcium, Ion 1.12 (L) 1.13 -  1.30 mmol/L   TCO2 26 0 - 100 mmol/L   Hemoglobin 11.2 (L) 13.0 - 17.0 g/dL   HCT 33.0 (L) 39.0 - 52.0 %  CBG monitoring, ED     Status: Abnormal   Collection Time: 02/24/15 11:02 PM  Result Value Ref Range   Glucose-Capillary 164 (H) 65 - 99 mg/dL   Ct Head Wo Contrast  02/24/2015   CLINICAL DATA:  Code stroke. Slurred speech and loss of consciousness. Severe confusion. Initial encounter.  EXAM: CT HEAD WITHOUT CONTRAST  TECHNIQUE: Contiguous axial images were obtained from the base of the skull through the vertex without intravenous contrast.  COMPARISON:  CT of the head performed 01/16/2015, and MRI of the brain performed 01/20/2015  FINDINGS: There is no evidence of acute infarction, mass lesion, or intra- or extra-axial hemorrhage on CT.  Prominence of the ventricles and sulci suggests mild cortical volume loss. Mild periventricular white matter change likely reflects small vessel ischemic microangiopathy. Decreased attenuation at the right frontal lobe is thought to reflect beam hardening artifact.  The brainstem and fourth ventricle are within normal limits. The basal ganglia are unremarkable in appearance. The cerebral hemispheres demonstrate grossly normal gray-white differentiation. No mass effect or midline shift is seen.  There is no evidence of fracture; visualized osseous structures are unremarkable in appearance. The orbits are within normal limits. The paranasal sinuses and mastoid air cells are well-aerated. No significant soft tissue abnormalities are seen.  IMPRESSION:  1. No acute intracranial pathology seen on CT. 2. Mild cortical volume loss and scattered small vessel ischemic microangiopathy. These results were called by telephone at the time of interpretation on 02/24/2015 at 10:59 pm to Dr. Armida Sans, who verbally acknowledged these results.   Electronically Signed   By: Garald Balding M.D.   On: 02/24/2015 23:01   Assessment: 74 y.o. male with several risk factors for stroke, brought in as a code stroke due to acute onset mental state changes and dysarthria. Although at times confused following instructions, NIHSS 2 for marked dysarthria. It is not clear when he was last known well, perhaps 3 pm today but this unclear and thus patient was not administer thrombolysis. Mild leukocytosis without fever, unclear source (UA pending). UDS pending but ETOH <5. Has no cortical signs on exam, thus can not exclude an acute subcortical infarction. Admit to medicine. I ordered complete stroke work up.  Stroke Risk Factors - age,  HTN, DM, TIA,  Plan: 1. HgbA1c, fasting lipid panel 2. MRI, MRA  of the brain without contrast 3. Echocardiogram 4. Carotid dopplers 5. Prophylactic therapy-aspirin after passing swallowing eval 6. Risk factor modification 7. Telemetry monitoring 8. Frequent neuro checks 9. PT/OT SLP 10. NPO  Dorian Pod, MD [facility] [phone]  02/24/2015, 11:13 PM

## 2015-02-24 NOTE — ED Notes (Signed)
Pt lives with young granddaughter and fell at about 5 am today. Family states pt hit head but stated he felt fine. Went fishing today and pt had slurred speech that got progressively worse at 3pm. Pt. Is a diabetic and CBG 222. Family states symptoms are AMS and slurred speech

## 2015-02-24 NOTE — ED Provider Notes (Signed)
CSN: [id number]     Arrival date & time 02/24/15  2235 History   First MD Initiated Contact with Patient 02/24/15 2242     Chief Complaint  Patient presents with  . Code Stroke     (Consider location/radiation/quality/duration/timing/severity/associated sxs/prior Treatment) HPI  ZABIAN TRUCKS is a 74 y.o. male with past medical history of hypertension, diabetes, hypothyroidism, TIA presenting today with strokelike symptoms. Patient cannot give history due to slurred speech. He fell early this morning at 5 AM. His granddaughter found him at home with slurred speech around 3 PM. She called 911 later that evening as it was not getting better.  Patient is currently denying any pain or neurological symptoms. He continues to have slurred speech in the room. There are no further complaints.  10 Systems reviewed and are negative for acute change except as noted in the HPI.   Past Medical History  Diagnosis Date  . Hypertension   . Diabetes mellitus without complication   . Hypothyroidism   . Shortness of breath   . TIA (transient ischemic attack)   . COPD (chronic obstructive pulmonary disease)    Past Surgical History  Procedure Laterality Date  . Surgery for bladder cancer  yrs ago  . Esophagogastroduodenoscopy N/A 09/23/2013    Procedure: ESOPHAGOGASTRODUODENOSCOPY (EGD);  Surgeon: Beryle Beams, MD;  Location: [facility];  Service: Endoscopy;  Laterality: N/A;  . Colonoscopy N/A 09/23/2013    Procedure: COLONOSCOPY;  Surgeon: Beryle Beams, MD;  Location: [facility];  Service: Endoscopy;  Laterality: N/A;  . Givens capsule study N/A 10/07/2013    Procedure: GIVENS CAPSULE STUDY;  Surgeon: Beryle Beams, MD;  Location: [facility];  Service: Endoscopy;  Laterality: N/A;   No family history on file. Social History  Substance Use Topics  . Smoking status: Never Smoker   . Smokeless tobacco: Current User    Types: Chew  . Alcohol Use: No    Review of Systems    Allergies   Prednisone  Home Medications   Prior to Admission medications   Medication Sig Start Date End Date Taking? Authorizing Provider  albuterol (PROVENTIL) (2.5 MG/3ML) 0.083% nebulizer solution Take 3 mLs (2.5 mg total) by nebulization every 2 (two) hours as needed for wheezing or shortness of breath. 01/25/15   Geradine Girt, DO  amLODipine (NORVASC) 5 MG tablet Take 5 mg by mouth every morning.    Historical Provider, MD  chlorhexidine (PERIDEX) 0.12 % solution 15 mLs by Mouth Rinse route 2 (two) times daily. 01/25/15   Geradine Girt, DO  insulin aspart (NOVOLOG) 100 UNIT/ML injection Inject 0-15 Units into the skin 3 (three) times daily with meals. 01/25/15   Geradine Girt, DO  insulin aspart (NOVOLOG) 100 UNIT/ML injection Inject 0-5 Units into the skin at bedtime. 01/25/15   Geradine Girt, DO  insulin glargine (LANTUS) 100 UNIT/ML injection Inject 0.1 mLs (10 Units total) into the skin at bedtime. 01/25/15   Geradine Girt, DO  ipratropium-albuterol (DUONEB) 0.5-2.5 (3) MG/3ML SOLN Take 3 mLs by nebulization every 6 (six) hours as needed. 01/25/15   Geradine Girt, DO  levothyroxine (SYNTHROID, LEVOTHROID) 150 MCG tablet Take 150 mcg by mouth daily before breakfast.    Historical Provider, MD  magnesium oxide (MAG-OX) 400 MG tablet Take 400 mg by mouth daily.    Historical Provider, MD  Maltodextrin-Xanthan Gum (Stacy) POWD As needed 01/25/15   Geradine Girt, DO  metoprolol succinate (TOPROL-XL) 25  MG 24 hr tablet Take 25 mg by mouth daily at 12 noon. 01/15/15   Historical Provider, MD  tamsulosin (FLOMAX) 0.4 MG CAPS capsule Take 0.4 mg by mouth daily at 12 noon. 01/15/15   Historical Provider, MD   There were no vitals taken for this visit. Physical Exam  Constitutional: Vital signs are normal. He appears well-developed and well-nourished.  Non-toxic appearance. He does not appear ill. No distress.  HENT:  Head: Normocephalic and atraumatic.  Nose: Nose normal.   Mouth/Throat: Oropharynx is clear and moist. No oropharyngeal exudate.  Eyes: Conjunctivae and EOM are normal. Pupils are equal, round, and reactive to light. No scleral icterus.  Neck: Normal range of motion. Neck supple. No tracheal deviation, no edema, no erythema and normal range of motion present. No thyroid mass and no thyromegaly present.  Cardiovascular: Normal rate, regular rhythm, S1 normal, S2 normal, normal heart sounds, intact distal pulses and normal pulses.  Exam reveals no gallop and no friction rub.   No murmur heard. Pulses:      Radial pulses are 2+ on the right side, and 2+ on the left side.       Dorsalis pedis pulses are 2+ on the right side, and 2+ on the left side.  Pulmonary/Chest: Effort normal and breath sounds normal. No respiratory distress. He has no wheezes. He has no rhonchi. He has no rales.  Abdominal: Soft. Normal appearance and bowel sounds are normal. He exhibits no distension, no ascites and no mass. There is no hepatosplenomegaly. There is no tenderness. There is no rebound, no guarding and no CVA tenderness.  Musculoskeletal: Normal range of motion. He exhibits no edema or tenderness.  Lymphadenopathy:    He has no cervical adenopathy.  Neurological: He is alert. He has normal strength. No cranial nerve deficit or sensory deficit. He exhibits normal muscle tone.  Slurred speech, normal strength and sensation in all extremities  Skin: Skin is warm, dry and intact. No petechiae and no rash noted. He is not diaphoretic. No erythema. No pallor.  Nursing note and vitals reviewed.   ED Course  Procedures (including critical care time) Labs Review Labs Reviewed  CBC - Abnormal; Notable for the following:    WBC 12.8 (*)    RBC 4.06 (*)    Hemoglobin 10.2 (*)    HCT 33.5 (*)    MCH 25.1 (*)    All other components within normal limits  DIFFERENTIAL - Abnormal; Notable for the following:    Neutrophils Relative % 85 (*)    Neutro Abs 10.9 (*)     Lymphocytes Relative 6 (*)    All other components within normal limits  COMPREHENSIVE METABOLIC PANEL - Abnormal; Notable for the following:    Sodium 134 (*)    Chloride 97 (*)    Glucose, Bld 183 (*)    Creatinine, Ser 1.68 (*)    GFR calc non Af Amer 39 (*)    GFR calc Af Amer 45 (*)    All other components within normal limits  I-STAT CHEM 8, ED - Abnormal; Notable for the following:    Chloride 97 (*)    Creatinine, Ser 1.70 (*)    Glucose, Bld 184 (*)    Calcium, Ion 1.12 (*)    Hemoglobin 11.2 (*)    HCT 33.0 (*)    All other components within normal limits  CBG MONITORING, ED - Abnormal; Notable for the following:    Glucose-Capillary 164 (*)  All other components within normal limits  PROTIME-INR  APTT  ETHANOL  URINE RAPID DRUG SCREEN, HOSP PERFORMED  URINALYSIS, ROUTINE W REFLEX MICROSCOPIC (NOT AT [facility])  TSH  T4, FREE  I-STAT TROPOININ, ED    Imaging Review Ct Head Wo Contrast  02/24/2015   CLINICAL DATA:  Code stroke. Slurred speech and loss of consciousness. Severe confusion. Initial encounter.  EXAM: CT HEAD WITHOUT CONTRAST  TECHNIQUE: Contiguous axial images were obtained from the base of the skull through the vertex without intravenous contrast.  COMPARISON:  CT of the head performed 01/16/2015, and MRI of the brain performed 01/20/2015  FINDINGS: There is no evidence of acute infarction, mass lesion, or intra- or extra-axial hemorrhage on CT.  Prominence of the ventricles and sulci suggests mild cortical volume loss. Mild periventricular white matter change likely reflects small vessel ischemic microangiopathy. Decreased attenuation at the right frontal lobe is thought to reflect beam hardening artifact.  The brainstem and fourth ventricle are within normal limits. The basal ganglia are unremarkable in appearance. The cerebral hemispheres demonstrate grossly normal gray-white differentiation. No mass effect or midline shift is seen.  There is no evidence of  fracture; visualized osseous structures are unremarkable in appearance. The orbits are within normal limits. The paranasal sinuses and mastoid air cells are well-aerated. No significant soft tissue abnormalities are seen.  IMPRESSION: 1. No acute intracranial pathology seen on CT. 2. Mild cortical volume loss and scattered small vessel ischemic microangiopathy. These results were called by telephone at the time of interpretation on 02/24/2015 at 10:59 pm to Dr. Armida Sans, who verbally acknowledged these results.   Electronically Signed   By: Garald Balding M.D.   On: 02/24/2015 23:01   I have personally reviewed and evaluated these images and lab results as part of my medical decision-making.   EKG Interpretation   Date/Time:  Saturday February 24 2015 23:03:24 EDT Ventricular Rate:  97 PR Interval:  187 QRS Duration: 104 QT Interval:  407 QTC Calculation: 517 R Axis:   55 Text Interpretation:  Sinus rhythm Prolonged QT interval Confirmed by Glynn Octave [phone]) on 02/24/2015 11:27:07 PM      MDM   Final diagnoses:  None  Patient presents to the emergency department for slurred speech. Code stroke was activated. Neurology is currently in the room evaluating the patient. His neurologic exam is otherwise normal aside from a slurred speech.  Neurology is recommending inpatient admission for evaluation for stroke including MRI. I spoke with try hospitalist except the patient to telemetry.   Everlene Balls, MD 02/24/15 2337

## 2015-02-25 ENCOUNTER — Other Ambulatory Visit (HOSPITAL_COMMUNITY): Payer: Medicare Other

## 2015-02-25 ENCOUNTER — Observation Stay (HOSPITAL_COMMUNITY): Payer: Medicare Other

## 2015-02-25 ENCOUNTER — Observation Stay (HOSPITAL_BASED_OUTPATIENT_CLINIC_OR_DEPARTMENT_OTHER): Payer: Medicare Other

## 2015-02-25 DIAGNOSIS — N179 Acute kidney failure, unspecified: Secondary | ICD-10-CM | POA: Diagnosis not present

## 2015-02-25 DIAGNOSIS — D72829 Elevated white blood cell count, unspecified: Secondary | ICD-10-CM | POA: Diagnosis present

## 2015-02-25 DIAGNOSIS — G9340 Encephalopathy, unspecified: Secondary | ICD-10-CM

## 2015-02-25 DIAGNOSIS — N183 Chronic kidney disease, stage 3 (moderate): Secondary | ICD-10-CM

## 2015-02-25 LAB — URINALYSIS, ROUTINE W REFLEX MICROSCOPIC
Bilirubin Urine: NEGATIVE
Glucose, UA: NEGATIVE mg/dL
Hgb urine dipstick: NEGATIVE
KETONES UR: NEGATIVE mg/dL
LEUKOCYTES UA: NEGATIVE
NITRITE: NEGATIVE
PH: 8.5 — AB (ref 5.0–8.0)
Protein, ur: 30 mg/dL — AB
Specific Gravity, Urine: 1.026 (ref 1.005–1.030)
Urobilinogen, UA: 0.2 mg/dL (ref 0.0–1.0)

## 2015-02-25 LAB — T4, FREE: FREE T4: 1.31 ng/dL — AB (ref 0.61–1.12)

## 2015-02-25 LAB — URINE MICROSCOPIC-ADD ON

## 2015-02-25 LAB — BASIC METABOLIC PANEL
Anion gap: 12 (ref 5–15)
BUN: 16 mg/dL (ref 6–20)
CHLORIDE: 99 mmol/L — AB (ref 101–111)
CO2: 23 mmol/L (ref 22–32)
CREATININE: 1.56 mg/dL — AB (ref 0.61–1.24)
Calcium: 8.8 mg/dL — ABNORMAL LOW (ref 8.9–10.3)
GFR calc Af Amer: 49 mL/min — ABNORMAL LOW (ref >60)
GFR calc non Af Amer: 43 mL/min — ABNORMAL LOW (ref >60)
GLUCOSE: 187 mg/dL — AB (ref 65–99)
POTASSIUM: 4.3 mmol/L (ref 3.5–5.1)
Sodium: 134 mmol/L — ABNORMAL LOW (ref 135–145)

## 2015-02-25 LAB — GLUCOSE, CAPILLARY
GLUCOSE-CAPILLARY: 169 mg/dL — AB (ref 65–99)
Glucose-Capillary: 174 mg/dL — ABNORMAL HIGH (ref 65–99)
Glucose-Capillary: 175 mg/dL — ABNORMAL HIGH (ref 65–99)
Glucose-Capillary: 141 mg/dL — ABNORMAL HIGH (ref 65–99)

## 2015-02-25 LAB — BLOOD GAS, ARTERIAL
ACID-BASE DEFICIT: 0.2 mmol/L (ref 0.0–2.0)
Bicarbonate: 23.7 mEq/L (ref 20.0–24.0)
Drawn by: 331761
FIO2: 0.21
O2 SAT: 90.3 %
PATIENT TEMPERATURE: 98.5
TCO2: 24.8 mmol/L (ref 0–100)
pCO2 arterial: 36.6 mmHg (ref 35.0–45.0)
pH, Arterial: 7.426 (ref 7.350–7.450)
pO2, Arterial: 56.2 mmHg — ABNORMAL LOW (ref 80.0–100.0)

## 2015-02-25 LAB — CBC
HCT: 33.5 % — ABNORMAL LOW (ref 39.0–52.0)
HEMOGLOBIN: 10.2 g/dL — AB (ref 13.0–17.0)
MCH: 24.9 pg — AB (ref 26.0–34.0)
MCHC: 30.4 g/dL (ref 30.0–36.0)
MCV: 81.9 fL (ref 78.0–100.0)
Platelets: 200 10*3/uL (ref 150–400)
RBC: 4.09 MIL/uL — AB (ref 4.22–5.81)
RDW: 15.3 % (ref 11.5–15.5)
WBC: 12.7 10*3/uL — ABNORMAL HIGH (ref 4.0–10.5)

## 2015-02-25 LAB — RAPID URINE DRUG SCREEN, HOSP PERFORMED
AMPHETAMINES: NOT DETECTED
BARBITURATES: NOT DETECTED
Benzodiazepines: NOT DETECTED
Cocaine: NOT DETECTED
OPIATES: NOT DETECTED
Tetrahydrocannabinol: NOT DETECTED

## 2015-02-25 LAB — LIPID PANEL
CHOL/HDL RATIO: 7.2 ratio
Cholesterol: 180 mg/dL (ref 0–200)
HDL: 25 mg/dL — ABNORMAL LOW (ref >40)
LDL Cholesterol: 105 mg/dL — ABNORMAL HIGH (ref 0–99)
Triglycerides: 251 mg/dL — ABNORMAL HIGH (ref <150)
VLDL: 50 mg/dL — AB (ref 0–40)

## 2015-02-25 LAB — TSH: TSH: 0.154 u[IU]/mL — AB (ref 0.350–4.500)

## 2015-02-25 MED ORDER — ASPIRIN 325 MG PO TABS
325.0000 mg | ORAL_TABLET | Freq: Every day | ORAL | Status: DC
  Administered 2015-02-27: 325 mg via ORAL
  Filled 2015-02-25: qty 1

## 2015-02-25 MED ORDER — LEVOTHYROXINE SODIUM 50 MCG PO TABS
150.0000 ug | ORAL_TABLET | Freq: Every day | ORAL | Status: DC
  Administered 2015-02-27: 150 ug via ORAL
  Filled 2015-02-25 (×2): qty 1

## 2015-02-25 MED ORDER — ALBUTEROL SULFATE (2.5 MG/3ML) 0.083% IN NEBU
2.5000 mg | INHALATION_SOLUTION | RESPIRATORY_TRACT | Status: DC | PRN
Start: 2015-02-25 — End: 2015-02-26
  Administered 2015-02-25: 2.5 mg via RESPIRATORY_TRACT
  Filled 2015-02-25: qty 3

## 2015-02-25 MED ORDER — ASPIRIN 300 MG RE SUPP
300.0000 mg | Freq: Every day | RECTAL | Status: DC
  Administered 2015-02-25 – 2015-02-26 (×2): 300 mg via RECTAL
  Filled 2015-02-25 (×2): qty 1

## 2015-02-25 MED ORDER — INSULIN ASPART 100 UNIT/ML ~~LOC~~ SOLN
0.0000 [IU] | Freq: Three times a day (TID) | SUBCUTANEOUS | Status: DC
  Administered 2015-02-26: 3 [IU] via SUBCUTANEOUS
  Administered 2015-02-26 – 2015-02-27 (×2): 2 [IU] via SUBCUTANEOUS
  Administered 2015-02-27: 11 [IU] via SUBCUTANEOUS

## 2015-02-25 MED ORDER — GLIPIZIDE 5 MG PO TABS: 10.0000 mg | ORAL_TABLET | Freq: Every day | ORAL | Status: DC

## 2015-02-25 MED ORDER — IPRATROPIUM-ALBUTEROL 0.5-2.5 (3) MG/3ML IN SOLN: 3.0000 mL | Freq: Four times a day (QID) | RESPIRATORY_TRACT | Status: DC

## 2015-02-25 MED ORDER — STROKE: EARLY STAGES OF RECOVERY BOOK
Freq: Once | Status: AC
  Administered 2015-02-25: 02:00:00

## 2015-02-25 MED ORDER — SODIUM CHLORIDE 0.9 % IV SOLN
INTRAVENOUS | Status: DC
  Administered 2015-02-25 (×3): via INTRAVENOUS

## 2015-02-25 MED ORDER — SENNOSIDES-DOCUSATE SODIUM 8.6-50 MG PO TABS: 1.0000 | ORAL_TABLET | Freq: Every evening | ORAL | Status: DC | PRN

## 2015-02-25 MED ORDER — LINAGLIPTIN 5 MG PO TABS: 5.0000 mg | ORAL_TABLET | Freq: Every day | ORAL | Status: DC

## 2015-02-25 MED ORDER — INSULIN ASPART 100 UNIT/ML ~~LOC~~ SOLN
0.0000 [IU] | Freq: Every day | SUBCUTANEOUS | Status: DC
  Administered 2015-02-26: 4 [IU] via SUBCUTANEOUS

## 2015-02-25 MED ORDER — LORAZEPAM 2 MG/ML IJ SOLN
1.0000 mg | Freq: Once | INTRAMUSCULAR | Status: AC
  Administered 2015-02-25: 1 mg via INTRAVENOUS
  Filled 2015-02-25: qty 1

## 2015-02-25 MED ORDER — INSULIN GLARGINE 100 UNIT/ML ~~LOC~~ SOLN
10.0000 [IU] | Freq: Every day | SUBCUTANEOUS | Status: DC
  Administered 2015-02-26: 10 [IU] via SUBCUTANEOUS
  Filled 2015-02-25 (×4): qty 0.1

## 2015-02-25 MED ORDER — CHLORHEXIDINE GLUCONATE 0.12 % MT SOLN
15.0000 mL | Freq: Two times a day (BID) | OROMUCOSAL | Status: DC
  Administered 2015-02-26 – 2015-02-27 (×3): 15 mL via OROMUCOSAL
  Filled 2015-02-25 (×2): qty 15

## 2015-02-25 MED ORDER — ENOXAPARIN SODIUM 40 MG/0.4ML ~~LOC~~ SOLN
40.0000 mg | Freq: Every day | SUBCUTANEOUS | Status: DC
  Administered 2015-02-25 – 2015-02-27 (×3): 40 mg via SUBCUTANEOUS
  Filled 2015-02-25 (×3): qty 0.4

## 2015-02-25 MED ORDER — LORAZEPAM 2 MG/ML IJ SOLN
1.0000 mg | Freq: Once | INTRAMUSCULAR | Status: AC
  Administered 2015-02-25: 0.5 mg via INTRAVENOUS
  Filled 2015-02-25: qty 1

## 2015-02-25 MED ORDER — IPRATROPIUM-ALBUTEROL 0.5-2.5 (3) MG/3ML IN SOLN: 3.0000 mL | Freq: Four times a day (QID) | RESPIRATORY_TRACT | Status: DC | PRN

## 2015-02-25 MED ORDER — LINACLOTIDE 290 MCG PO CAPS
290.0000 ug | ORAL_CAPSULE | Freq: Every day | ORAL | Status: DC
  Administered 2015-02-26 – 2015-02-27 (×2): 290 ug via ORAL
  Filled 2015-02-25 (×4): qty 1

## 2015-02-25 MED ORDER — LORAZEPAM BOLUS VIA INFUSION: 1.0000 mg | Freq: Once | INTRAVENOUS | Status: DC

## 2015-02-25 NOTE — Progress Notes (Signed)
Patient admitted after midnight. Patient known to me from previous. 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 Will check ABG and minimize sedation.  Doree Barthel, MD [facility]

## 2015-02-25 NOTE — Progress Notes (Signed)
Patient trying to get out of the bed.  Pulled his condom cath off and also pulled out his  IV.  Family members took IV and put it in the trash can.  Did remove and put in Sharps. Patient was able to use the urinal. Explained to family members that he cannot get up even with their help.

## 2015-02-25 NOTE — Progress Notes (Signed)
VASCULAR LAB PRELIMINARY  PRELIMINARY  PRELIMINARY  PRELIMINARY  Carotid duplex  completed.    Technically limited by patient cooperation and movements.  Preliminary report:  Bilateral:  1-39% ICA stenosis.  Vertebral artery flow is antegrade.      Marquetta Weiskopf, RVT 02/25/2015, 3:49 PM

## 2015-02-25 NOTE — Progress Notes (Signed)
Patient lying in bed, slightly combative, pulled off all of his leads, took his gown off, and trying to get out of the bed. Replaced all leads and put gown back on.

## 2015-02-25 NOTE — Progress Notes (Signed)
Patient arrived to 5M18 awake and combative whenever staff is providing care.  Unable to verbalize needs due to slurred speech.

## 2015-02-25 NOTE — H&P (Signed)
History and Physical  Harold Martin [id number] DOB: Nov 15, 1940 DOA: 02/24/2015  Referring physician: Dr Claudine Mouton, ED physician PCP: Dwan Bolt, MD   Chief Complaint: Code Stroke  HPI: Harold Martin is a 74 y.o. male  History of diabetes, hypertension, hypothyroidism, COPD, TIA. The patient presents to the hospital with slurred speech that started sometime midmorning. The symptoms continued and did not improve. The patient was seen by his granddaughter around 3 PM, who noticed slurred speech and mental status changes. The patient went fishing with his granddaughter, and finally called EMS at 10 PM for the worsening slurred speech. No palliating or provoking factors. In the emergency department the patient was evaluated by neurology, who determined that it was not appropriate for thrombolysis. Neurology recommended admission with a stroke workup.   Review of Systems:   Pt denies any fevers, chills, nausea, vomiting, diarrhea, constipation, abdominal pain, shortness of breath, dyspnea on exertion, orthopnea, cough, wheezing, palpitations, headache, vision changes, lightheadedness, dizziness, diarrhea, constipation, melena, rectal bleeding.  Review of systems are otherwise negative  Past Medical History  Diagnosis Date  . Hypertension   . Diabetes mellitus without complication   . Hypothyroidism   . Shortness of breath   . TIA (transient ischemic attack)   . COPD (chronic obstructive pulmonary disease)    Past Surgical History  Procedure Laterality Date  . Surgery for bladder cancer  yrs ago  . Esophagogastroduodenoscopy N/A 09/23/2013    Procedure: ESOPHAGOGASTRODUODENOSCOPY (EGD);  Surgeon: Beryle Beams, MD;  Location: [facility];  Service: Endoscopy;  Laterality: N/A;  . Colonoscopy N/A 09/23/2013    Procedure: COLONOSCOPY;  Surgeon: Beryle Beams, MD;  Location: [facility];  Service: Endoscopy;  Laterality: N/A;  . Givens capsule study N/A 10/07/2013    Procedure:  GIVENS CAPSULE STUDY;  Surgeon: Beryle Beams, MD;  Location: [facility];  Service: Endoscopy;  Laterality: N/A;   Social History:  reports that he has never smoked. His smokeless tobacco use includes Chew. He reports that he does not drink alcohol or use illicit drugs. Patient lives at home & is able to participate in activities of daily living   Allergies  Allergen Reactions  . Prednisone     REACTION: Reaction not known    History reviewed. No pertinent family history. patient unaware of family history   Prior to Admission medications   Medication Sig Start Date End Date Taking? Authorizing Provider  albuterol (PROVENTIL) (2.5 MG/3ML) 0.083% nebulizer solution Take 3 mLs (2.5 mg total) by nebulization every 2 (two) hours as needed for wheezing or shortness of breath. 01/25/15  Yes Jessica U Vann, DO  amLODipine (NORVASC) 5 MG tablet Take 5 mg by mouth every morning.   Yes Historical Provider, MD  Cyanocobalamin (VITAMIN B-12) 2500 MCG SUBL Place 1 tablet under the tongue daily.   Yes Historical Provider, MD  glipiZIDE (GLUCOTROL) 10 MG tablet Take 10 mg by mouth daily before breakfast.   Yes Historical Provider, MD  ipratropium-albuterol (DUONEB) 0.5-2.5 (3) MG/3ML SOLN Take 3 mLs by nebulization every 6 (six) hours as needed. Patient taking differently: Take 3 mLs by nebulization every 6 (six) hours as needed (shortness of breath).  01/25/15  Yes Geradine Girt, DO  levothyroxine (SYNTHROID, LEVOTHROID) 150 MCG tablet Take 150 mcg by mouth daily before breakfast.   Yes Historical Provider, MD  Linaclotide (LINZESS) 290 MCG CAPS capsule Take 290 mcg by mouth daily.   Yes Historical Provider, MD  lisinopril (PRINIVIL,ZESTRIL) 20 MG tablet Take  20 mg by mouth daily.   Yes Historical Provider, MD  magnesium oxide (MAG-OX) 400 MG tablet Take 400 mg by mouth daily.   Yes Historical Provider, MD  metoprolol succinate (TOPROL-XL) 25 MG 24 hr tablet Take 25 mg by mouth daily.  01/15/15  Yes  Historical Provider, MD  sitaGLIPtin (JANUVIA) 100 MG tablet Take 100 mg by mouth daily.   Yes Historical Provider, MD  tamsulosin (FLOMAX) 0.4 MG CAPS capsule Take 0.4 mg by mouth daily at 12 noon. 01/15/15  Yes Historical Provider, MD  chlorhexidine (PERIDEX) 0.12 % solution 15 mLs by Mouth Rinse route 2 (two) times daily. 01/25/15   Geradine Girt, DO  insulin aspart (NOVOLOG) 100 UNIT/ML injection Inject 0-15 Units into the skin 3 (three) times daily with meals. 01/25/15   Geradine Girt, DO  insulin aspart (NOVOLOG) 100 UNIT/ML injection Inject 0-5 Units into the skin at bedtime. 01/25/15   Geradine Girt, DO  insulin glargine (LANTUS) 100 UNIT/ML injection Inject 0.1 mLs (10 Units total) into the skin at bedtime. 01/25/15   Geradine Girt, DO  Maltodextrin-Xanthan Gum ([place]) POWD As needed 01/25/15   Geradine Girt, DO    Physical Exam: BP 141/95 mmHg  Pulse 98  Temp(Src) 98.2 F (36.8 C)  Resp 18  Ht 5\' 11"  (1.803 m)  Wt 106.142 kg (234 lb)  BMI 32.65 kg/m2  SpO2 98%  General: Elderly Caucasian male. Awake and alert and oriented x3. No acute cardiopulmonary distress.  Eyes: Pupils equal, round, reactive to light. Extraocular muscles are intact. Sclerae anicteric and noninjected.  ENT: Moist mucosal membranes. No mucosal lesions.  Neck: Neck supple without lymphadenopathy. No carotid bruits. No masses palpated.  Cardiovascular: Regular rate with normal S1-S2 sounds. No murmurs, rubs, gallops auscultated. No JVD.  Respiratory: Good respiratory effort with no wheezes, rales, rhonchi. Lungs clear to auscultation bilaterally.  Abdomen: Soft, nontender, nondistended. Active bowel sounds. No masses or hepatosplenomegaly  Skin: Dry, warm to touch. 2+ dorsalis pedis and radial pulses. Musculoskeletal: No calf or leg pain. All major joints not erythematous nontender.  Psychiatric: Intact judgment and insight.  Neurologic: Slurred speech/dysarthria. Cranial nerves II through  XII are grossly intact. Motor testing in upper and lower           Labs on Admission:  Basic Metabolic Panel:  Recent Labs Lab 02/24/15 2237 02/24/15 2251  NA 134* 135  K 4.1 4.0  CL 97* 97*  CO2 27  --   GLUCOSE 183* 184*  BUN 17 20  CREATININE 1.68* 1.70*  CALCIUM 9.4  --    Liver Function Tests:  Recent Labs Lab 02/24/15 2237  AST 22  ALT 19  ALKPHOS 107  BILITOT 0.6  PROT 6.8  ALBUMIN 3.8   No results for input(s): LIPASE, AMYLASE in the last 168 hours. No results for input(s): AMMONIA in the last 168 hours. CBC:  Recent Labs Lab 02/24/15 2237 02/24/15 2251  WBC 12.8*  --   NEUTROABS 10.9*  --   HGB 10.2* 11.2*  HCT 33.5* 33.0*  MCV 82.5  --   PLT 203  --    Cardiac Enzymes: No results for input(s): CKTOTAL, CKMB, CKMBINDEX, TROPONINI in the last 168 hours.  BNP (last 3 results)  Recent Labs  01/18/15 1035  BNP 200.6*    ProBNP (last 3 results) No results for input(s): PROBNP in the last 8760 hours.  CBG:  Recent Labs Lab 02/24/15 2302  GLUCAP 164*  Radiological Exams on Admission: Ct Head Wo Contrast  02/24/2015   CLINICAL DATA:  Code stroke. Slurred speech and loss of consciousness. Severe confusion. Initial encounter.  EXAM: CT HEAD WITHOUT CONTRAST  TECHNIQUE: Contiguous axial images were obtained from the base of the skull through the vertex without intravenous contrast.  COMPARISON:  CT of the head performed 01/16/2015, and MRI of the brain performed 01/20/2015  FINDINGS: There is no evidence of acute infarction, mass lesion, or intra- or extra-axial hemorrhage on CT.  Prominence of the ventricles and sulci suggests mild cortical volume loss. Mild periventricular white matter change likely reflects small vessel ischemic microangiopathy. Decreased attenuation at the right frontal lobe is thought to reflect beam hardening artifact.  The brainstem and fourth ventricle are within normal limits. The basal ganglia are unremarkable in  appearance. The cerebral hemispheres demonstrate grossly normal gray-white differentiation. No mass effect or midline shift is seen.  There is no evidence of fracture; visualized osseous structures are unremarkable in appearance. The orbits are within normal limits. The paranasal sinuses and mastoid air cells are well-aerated. No significant soft tissue abnormalities are seen.  IMPRESSION: 1. No acute intracranial pathology seen on CT. 2. Mild cortical volume loss and scattered small vessel ischemic microangiopathy. These results were called by telephone at the time of interpretation on 02/24/2015 at 10:59 pm to Dr. Armida Sans, who verbally acknowledged these results.   Electronically Signed   By: Garald Balding M.D.   On: 02/24/2015 23:01    EKG: Independently reviewed. Sinus rhythm with ventricular rate of 97. Normal intervals. Slightly prolonged QTC. No acute ST elevation.  Assessment/Plan Present on Admission:  . CVA (cerebral infarction) . Acute renal failure superimposed on stage 3 chronic kidney disease . Leukocytosis  This patient was discussed with the ED physician, including pertinent vitals, physical exam findings, labs, and imaging.  We also discussed care given by the ED provider.  #1 CVA  Admit to telemetry  MRI/MRA  Hemoglobin A1c, lipid panel  Echocardiogram  Carotid duplex  PT/OT/speech therapy  Full dose aspirin #2 acute renal failure superimposed on stage III chronic any disease  IV fluids  Check creatinine in the morning #3 leukocytosis  No evidence of infection, likely stress response  Recheck CBC in the morning #4 diabetes type 2  CBGs before meals and daily at bedtime  5 scale insulin  Home medications #5 hypertension  Hold antihypertensives unless blood pressure is greater than [id number] systolically  DVT prophylaxis: Lovenox  Consultants: Neurology - appreciate consult  Code Status: Full code  Family Communication: None   Disposition Plan:  Admission   Truett Mainland, DO [facility] Pager [phone]

## 2015-02-25 NOTE — Progress Notes (Signed)
SLP Cancellation Note  Patient Details Name: Harold Martin MRN: [id number] DOB: [birth year]   Cancelled treatment:       Reason Eval/Treat Not Completed: Patient's level of consciousness. Patient arousable however confused, not opening eyes, combative. Not alert enough for evaluation. Will f/u 8/22.   [place], CCC-SLP [phone]    Gabriel Rainwater Meryl 02/25/2015, 8:47 AM

## 2015-02-26 ENCOUNTER — Observation Stay (HOSPITAL_COMMUNITY): Payer: Medicare Other

## 2015-02-26 DIAGNOSIS — J209 Acute bronchitis, unspecified: Secondary | ICD-10-CM | POA: Diagnosis not present

## 2015-02-26 DIAGNOSIS — G9340 Encephalopathy, unspecified: Secondary | ICD-10-CM | POA: Diagnosis not present

## 2015-02-26 DIAGNOSIS — J42 Unspecified chronic bronchitis: Secondary | ICD-10-CM | POA: Diagnosis not present

## 2015-02-26 DIAGNOSIS — J449 Chronic obstructive pulmonary disease, unspecified: Secondary | ICD-10-CM | POA: Diagnosis present

## 2015-02-26 LAB — GLUCOSE, CAPILLARY
GLUCOSE-CAPILLARY: 136 mg/dL — AB (ref 65–99)
GLUCOSE-CAPILLARY: 168 mg/dL — AB (ref 65–99)
GLUCOSE-CAPILLARY: 303 mg/dL — AB (ref 65–99)
Glucose-Capillary: 141 mg/dL — ABNORMAL HIGH (ref 65–99)

## 2015-02-26 LAB — HEMOGLOBIN A1C
Hgb A1c MFr Bld: 10.5 % — ABNORMAL HIGH (ref 4.8–5.6)
MEAN PLASMA GLUCOSE: 255 mg/dL

## 2015-02-26 MED ORDER — IPRATROPIUM-ALBUTEROL 0.5-2.5 (3) MG/3ML IN SOLN
3.0000 mL | Freq: Four times a day (QID) | RESPIRATORY_TRACT | Status: DC
  Administered 2015-02-26 – 2015-02-27 (×2): 3 mL via RESPIRATORY_TRACT
  Filled 2015-02-26 (×2): qty 3

## 2015-02-26 MED ORDER — TAMSULOSIN HCL 0.4 MG PO CAPS
0.4000 mg | ORAL_CAPSULE | Freq: Every day | ORAL | Status: DC
  Administered 2015-02-26 – 2015-02-27 (×2): 0.4 mg via ORAL
  Filled 2015-02-26 (×2): qty 1

## 2015-02-26 MED ORDER — AZITHROMYCIN 500 MG PO TABS
500.0000 mg | ORAL_TABLET | Freq: Every day | ORAL | Status: DC
  Administered 2015-02-26 – 2015-02-27 (×2): 500 mg via ORAL
  Filled 2015-02-26 (×3): qty 1

## 2015-02-26 MED ORDER — GUAIFENESIN ER 600 MG PO TB12
600.0000 mg | ORAL_TABLET | Freq: Two times a day (BID) | ORAL | Status: DC
  Administered 2015-02-26 – 2015-02-27 (×3): 600 mg via ORAL
  Filled 2015-02-26 (×3): qty 1

## 2015-02-26 NOTE — Progress Notes (Addendum)
[facility] PROGRESS NOTE  Harold Martin [id number] DOB: 04/14/1941 DOA: 02/24/2015 PCP: Dwan Bolt, MD  Assessment/Plan:  Principal Problem:   Acute encephalopathy, recurrent. Patient known to me from previous admission. Previously, encephalopathy worked up extensively and the only thing significant found was hypercarbia. Felt to have CO2 narcosis. ABG yesterday did not show same. However, physical therapy reports that patient desaturates into the mid 80s when ambulating. Patient has a cough productive appear ill and sputum and feels slightly short of breath. Lungs are tight on exam. I suspect these recurrent episodes are related to hypoxia/hypercarbia/COPD. Much improved today. Discussed with PT and with patient's daughter. Patient will need either 24-hour supervision or skilled nursing facility. He was discharged on oxygen by Dr. Eliseo Squires but apparently this was not continued at nursing home per patient or daughter for report, nor at home. Daughter also reports that he seems to be using his inhaler incorrectly and seems to do better with nebulizers. This episode similar to previous except not as prolonged. Doubt TIA or stroke. Will start diet as patient is more alert. Minimize sedatives. Continue  oxygen    COPD (chronic obstructive pulmonary disease):  Patient with purulent cough, mild shortness of breath, hypoxia with ambulation, and diminished airflow on physical exam. We'll give scheduled duo nebs. Hold off on steroids for now.    Acute bronchitis: Start Z-Pak and Mucinex. Patient unable to expectorate sputum which is in the back of throat.    Acute renal failure superimposed on stage 3 chronic kidney disease  Code Status:  full Family Communication:  Daughter by phone Disposition Plan:  SNF  HPI/Subjective: When asked, admits to cough. Slight shortness of breath. No other complaints.  Objective: Filed Vitals:   02/26/15 1008  BP: 141/71  Pulse: 89  Temp: 98.3  F (36.8 C)  Resp: 20    Intake/Output Summary (Last 24 hours) at 02/26/15 1150 Last data filed at 02/26/15 0800  Gross per 24 hour  Intake      0 ml  Output    500 ml  Net   -500 ml   Filed Weights   02/24/15 2305 02/25/15 0115  Weight: 106.142 kg (234 lb) 97 kg (213 lb 13.5 oz)    Exam:   General:  Alert in chair. Cooperative. Answers questions. Knows location.   Cardiovascular: Regular rate rhythm without murmurs gallops rubs  Respiratory: Diminished throughout with very quiet breath sounds, nearly inaudible.  Abdomen: Soft nontender nondistended. Obese.  Ext: No clubbing cyanosis. Trace edema with hyperpigmentation  Neurologic: Alert. Disoriented to time. Oriented to person and place. No focal deficits.  Basic Metabolic Panel:  Recent Labs Lab 02/24/15 2237 02/24/15 2251 02/25/15 0220  NA 134* 135 134*  K 4.1 4.0 4.3  CL 97* 97* 99*  CO2 27  --  23  GLUCOSE 183* 184* 187*  BUN 17 20 16   CREATININE 1.68* 1.70* 1.56*  CALCIUM 9.4  --  8.8*   Liver Function Tests:  Recent Labs Lab 02/24/15 2237  AST 22  ALT 19  ALKPHOS 107  BILITOT 0.6  PROT 6.8  ALBUMIN 3.8   No results for input(s): LIPASE, AMYLASE in the last 168 hours. No results for input(s): AMMONIA in the last 168 hours. CBC:  Recent Labs Lab 02/24/15 2237 02/24/15 2251 02/25/15 0220  WBC 12.8*  --  12.7*  NEUTROABS 10.9*  --   --   HGB 10.2* 11.2* 10.2*  HCT 33.5* 33.0* 33.5*  MCV 82.5  --  81.9  PLT 203  --  200   Cardiac Enzymes: No results for input(s): CKTOTAL, CKMB, CKMBINDEX, TROPONINI in the last 168 hours. BNP (last 3 results)  Recent Labs  01/18/15 1035  BNP 200.6*    ProBNP (last 3 results) No results for input(s): PROBNP in the last 8760 hours.  CBG:  Recent Labs Lab 02/25/15 1228 02/25/15 1634 02/25/15 2057 02/26/15 0649 02/26/15 1112  GLUCAP 175* 169* 141* 141* 136*    No results found for this or any previous visit (from the past 240 hour(s)).    Studies: Dg Chest 2 View  02/25/2015   CLINICAL DATA:  Syncope and altered mental status  EXAM: CHEST  2 VIEW  COMPARISON:  01/24/2015  FINDINGS: Hypoventilation with streaky lower lobe opacities in the lateral projection. There is no edema, effusion, or pneumothorax. Borderline cardiomegaly, accentuated by technique. Negative aortic and hilar contours.  IMPRESSION: Low volumes with lower lobe opacities favoring atelectasis or aspiration.   Electronically Signed   By: Monte Fantasia M.D.   On: 02/25/2015 00:17   Ct Head Wo Contrast  02/24/2015   CLINICAL DATA:  Code stroke. Slurred speech and loss of consciousness. Severe confusion. Initial encounter.  EXAM: CT HEAD WITHOUT CONTRAST  TECHNIQUE: Contiguous axial images were obtained from the base of the skull through the vertex without intravenous contrast.  COMPARISON:  CT of the head performed 01/16/2015, and MRI of the brain performed 01/20/2015  FINDINGS: There is no evidence of acute infarction, mass lesion, or intra- or extra-axial hemorrhage on CT.  Prominence of the ventricles and sulci suggests mild cortical volume loss. Mild periventricular white matter change likely reflects small vessel ischemic microangiopathy. Decreased attenuation at the right frontal lobe is thought to reflect beam hardening artifact.  The brainstem and fourth ventricle are within normal limits. The basal ganglia are unremarkable in appearance. The cerebral hemispheres demonstrate grossly normal gray-white differentiation. No mass effect or midline shift is seen.  There is no evidence of fracture; visualized osseous structures are unremarkable in appearance. The orbits are within normal limits. The paranasal sinuses and mastoid air cells are well-aerated. No significant soft tissue abnormalities are seen.  IMPRESSION: 1. No acute intracranial pathology seen on CT. 2. Mild cortical volume loss and scattered small vessel ischemic microangiopathy. These results were called by  telephone at the time of interpretation on 02/24/2015 at 10:59 pm to Dr. Armida Sans, who verbally acknowledged these results.   Electronically Signed   By: Garald Balding M.D.   On: 02/24/2015 23:01   Mr Brain Wo Contrast  02/25/2015   CLINICAL DATA:  Agitation. Confusion. Fell at 5 o'clock this morning, 5 hours ago. Trauma to the head. Worsen slurred speech and mental status changes of several days duration.  EXAM: MRI HEAD WITHOUT CONTRAST  TECHNIQUE: Multiplanar, multiecho pulse sequences of the brain and surrounding structures were obtained without intravenous contrast.  COMPARISON:  Head CT 02/24/2015.  MRI 01/20/2015.  FINDINGS: The study suffers from motion degradation. Diffusion imaging does not show any acute or subacute infarction. The brainstem and cerebellum are unremarkable. The cerebral hemispheres show mild atrophy but without evidence of old infarction. No mass lesion, hemorrhage, hydrocephalus or extra-axial collection.  IMPRESSION: Motion degraded exam. No acute or focal finding. No pathology identified.   Electronically Signed   By: Nelson Chimes M.D.   On: 02/25/2015 11:24    Scheduled Meds: . aspirin  300 mg Rectal Daily   Or  . aspirin  325 mg Oral  Daily  . azithromycin  500 mg Oral Daily  . chlorhexidine  15 mL Mouth Rinse BID  . enoxaparin (LOVENOX) injection  40 mg Subcutaneous Daily  . insulin aspart  0-15 Units Subcutaneous TID WC  . insulin aspart  0-5 Units Subcutaneous QHS  . insulin glargine  10 Units Subcutaneous QHS  . levothyroxine  150 mcg Oral QAC breakfast  . Linaclotide  290 mcg Oral Daily   Continuous Infusions: . sodium chloride 100 mL/hr at 02/25/15 2123    Time spent: 35 minutes  [facility]  [email or web address], password [facility] 02/26/2015, 11:50 AM

## 2015-02-26 NOTE — Progress Notes (Signed)
Subjective: Patient is very lethargic from receiving Ativan yesterday and this AM   Exam: Filed Vitals:   02/26/15 1008  BP: 141/71  Pulse: 89  Temp: 98.3 F (36.8 C)  Resp: 20    HEENT-  Normocephalic, no lesions, without obvious abnormality.  Normal external eye and conjunctiva.  Normal TM's bilaterally.  Normal auditory canals and external ears. Normal external nose, mucus membranes and septum.  Normal pharynx. Cardiovascular- S1, S2 normal, pulses palpable throughout   Lungs- chest clear, no wheezing, rales, normal symmetric air entry, Heart exam - S1, S2 normal, no murmur, no gallop, rate regular Abdomen- normal findings: bowel sounds normal Extremities- no edema    Gen: In bed, NAD MS: Lethargic but able to follow simple commands such as holding his hands up , smiling, lifting his legs. Speech significantly dysarthric.  [id number], EOMI, TML, face symmetric, blinks to threat bilaterally Motor: Moving all extremities antigravity Sensory: withdrawals to pain throughout DTR:2+ bilateral UE, 1+ in KJ bilaterally and no AJ  Pertinent Labs: No new labs Echo, Carotid, A1c pending LDL 105 UA (-) for leukocytes or nitrites.   Etta Quill PA-C [facility] [phone]  On my exam later, he was awake and oriented to place and year, but missed the month.   Impression:  74 yo M with AMS. He was started on O2 with significant improvement in exam. This is similar to his response to O2 during his last admission. I suspect that he is chronically hypoxic and not compensating well with any physiological stressor(complaining of diarrhea). I do not think there is any reason to repeat the extensive workup that he had on his previous admission.   Recommendations: 1) O2 supplementation.  2) please call if any further questions remain   Roland Rack, MD [facility] [phone]  If 7pm- 7am, please page neurology on call as listed in  [place].   02/26/2015, 10:28 AM

## 2015-02-26 NOTE — Evaluation (Signed)
Clinical/Bedside Swallow Evaluation Patient Details  Name: Harold Martin MRN: [id number] Date of Birth: 02-Mar-1941  Today's Date: 02/26/2015 Time: SLP Start Time (ACUTE ONLY): 1155 SLP Stop Time (ACUTE ONLY): 1209 SLP Time Calculation (min) (ACUTE ONLY): 14 min  Past Medical History:  Past Medical History  Diagnosis Date  . Hypertension   . Diabetes mellitus without complication   . Hypothyroidism   . Shortness of breath   . TIA (transient ischemic attack)   . COPD (chronic obstructive pulmonary disease)    Past Surgical History:  Past Surgical History  Procedure Laterality Date  . Surgery for bladder cancer  yrs ago  . Esophagogastroduodenoscopy N/A 09/23/2013    Procedure: ESOPHAGOGASTRODUODENOSCOPY (EGD);  Surgeon: Beryle Beams, MD;  Location: [facility];  Service: Endoscopy;  Laterality: N/A;  . Colonoscopy N/A 09/23/2013    Procedure: COLONOSCOPY;  Surgeon: Beryle Beams, MD;  Location: [facility];  Service: Endoscopy;  Laterality: N/A;  . Givens capsule study N/A 10/07/2013    Procedure: GIVENS CAPSULE STUDY;  Surgeon: Beryle Beams, MD;  Location: [facility];  Service: Endoscopy;  Laterality: N/A;   HPI:  Harold Martin is a 74 y.o. male with history of diabetes, hypertension, hypothyroidism, COPD, TIA, admitted with slurred speech. CXR 8/20: Low volumes with lower lobe opacities favoring atelectasis or aspiration. Head CT without acute intracranial abnormality. MRI pending. Recent discharge from [facility] 7/21 following admission for encephalopathy. Seen by SLP at that time and recommendations for dysphagia 2 diet with nectar thick liquids were made.    Assessment / Plan / Recommendation Clinical Impression  Swallow evaluation complete. Patient with initial difficulty protecting airway, suspect dried pharyngeal secretions coating palate, pharynx, playing a role during the swallow. With strong cough response to thin liquid, patient with eventual oral expectoration of thick  mucous which effectively cleared vocal quality and allowed for subsequent po trials of pureed solid (declined soft due to missing dentition) and thin liquids with what appeared to be a functional swallow and no overt indication of aspiration. Although suspect that patient's swallowing function is intact for initiation of a po diet today, recommend instrumental testing to determine least restrictive pos in light of h/o dysphagia on previous admission and possible aspiration noted on CXR. Note that previous difficulty likely related to AMS which is now clearing. Will plan for MBS this pm.     Aspiration Risk  Moderate    Diet Recommendation NPO   Medication Administration: Via alternative means    Other  Recommendations Oral Care Recommendations: Oral care QID   Follow Up Recommendations       Frequency and Duration        Pertinent Vitals/Pain n/a        Swallow Study Prior Functional Status  Type of Home: Mobile home Available Help at Discharge: Family (intermittently)    General Other Pertinent Information: Harold Martin is a 74 y.o. male with history of diabetes, hypertension, hypothyroidism, COPD, TIA, admitted with slurred speech. CXR 8/20: Low volumes with lower lobe opacities favoring atelectasis or aspiration. Head CT without acute intracranial abnormality. MRI pending. Recent discharge from [facility] 7/21 following admission for encephalopathy. Seen by SLP at that time and recommendations for dysphagia 2 diet with nectar thick liquids were made.  Type of Study: Bedside swallow evaluation Previous Swallow Assessment: see HPI Diet Prior to this Study: NPO Temperature Spikes Noted: No Respiratory Status: Room air History of Recent Intubation: No Behavior/Cognition: Alert;Cooperative;Pleasant mood;Confused Oral Cavity - Dentition: Missing dentition (  missing bottom dentures, upper dentures in place) Self-Feeding Abilities: Able to feed self Patient Positioning: Upright in  chair/Tumbleform Baseline Vocal Quality: Hoarse (hyonasal, suspect secretions coating pharynx) Volitional Cough: Strong Volitional Swallow: Able to elicit    Oral/Motor/Sensory Function Overall Oral Motor/Sensory Function: Appears within functional limits for tasks assessed   Ice Chips Ice chips: Impaired Presentation: Spoon Pharyngeal Phase Impairments: Throat Clearing - Immediate   Thin Liquid Thin Liquid: Impaired Presentation: Cup;Self Fed Pharyngeal  Phase Impairments: Cough - Immediate    Nectar Thick Nectar Thick Liquid: Not tested   Honey Thick Honey Thick Liquid: Not tested   Puree Puree: Within functional limits Presentation: Self Fed;Spoon   Solid   GO   [name] MA, CCC-SLP [phone]  Solid: Not tested       [name] Meryl 02/26/2015,12:20 PM

## 2015-02-26 NOTE — Progress Notes (Signed)
MBSS complete. Full report located under chart review in imaging section.  Jalien Weakland MA, CCC-SLP [phone]   

## 2015-02-26 NOTE — Progress Notes (Signed)
Patient is now more awake and alert. He appears calm and able to answer questions/follow command although noted a little forgetful. Restraints d/c'ed per MD. In chair at this time with PT/OT at bedside. Will continue to monitor.   Ave Filter, RN

## 2015-02-26 NOTE — Evaluation (Signed)
Occupational Therapy Evaluation Patient Details Name: Harold Martin MRN: [id number] DOB: May 23, 1941 Today's Date: 02/26/2015    History of Present Illness History of diabetes, hypertension, hypothyroidism, COPD, TIA. The patient presents to the hospital with slurred speech that started sometime midmorning. The symptoms continued and did not improve. MRI negative. Pt here 3 weeks ago with encephalopathy.   Clinical Impression   This 74 yo male admitted with above presents to acute OT with decreased balance, decreased mobility, impulsivity, speech that is hard to understand all affecting his safety with being home alone. He will benefit from acute OT with follow up OT at SNF to be able to get back home by himself.    Follow Up Recommendations  SNF (pt prefers the same facility his wife is at)    Equipment Recommendations   (TBD at SNF)       Precautions / Restrictions Precautions Precautions: Fall Precaution Comments: monitor O2 Restrictions Weight Bearing Restrictions: No      Mobility Bed Mobility Overal bed mobility: Needs Assistance Bed Mobility: Supine to Sit     Supine to sit: Min guard;HOB elevated     General bed mobility comments: Min guard for safety. Impulsive to get to EOB. "let me get my head in order." Use of rails.  Transfers Overall transfer level: Needs assistance Equipment used: Rolling walker (2 wheeled) Transfers: Sit to/from Stand Sit to Stand: Min guard         General transfer comment: Min guard for safety. Impulsive to stand from EOB. Transferred to chair post ambulation bout.    Balance Overall balance assessment: Needs assistance Sitting-balance support: Feet supported;No upper extremity supported Sitting balance-Leahy Scale: Fair     Standing balance support: During functional activity Standing balance-Leahy Scale: Poor Standing balance comment: reliant on RW for support and impulsivity                            ADL  Overall ADL's : Needs assistance/impaired Eating/Feeding: Independent;Sitting   Grooming: Set up;Sitting   Upper Body Bathing: Set up;Sitting   Lower Body Bathing: Min guard;Sit to/from stand   Upper Body Dressing : Set up;Sitting   Lower Body Dressing: Min guard;Sit to/from stand   Toilet Transfer: Minimal assistance;Ambulation;RW (bed>146feet>recliner)   Toileting- Clothing Manipulation and Hygiene: Min guard;Sit to/from stand               Vision Additional Comments: Wears glasses all the time (not here)          Pertinent Vitals/Pain Pain Assessment: No/denies pain     Hand Dominance Left   Extremity/Trunk Assessment Upper Extremity Assessment Upper Extremity Assessment: Overall WFL for tasks assessed   Lower Extremity Assessment Lower Extremity Assessment: Generalized weakness   Cervical / Trunk Assessment Cervical / Trunk Assessment: Normal   Communication Communication Communication: HOH (dysarthric)   Cognition Arousal/Alertness: Awake/alert Behavior During Therapy: Impulsive Overall Cognitive Status: Impaired/Different from baseline Area of Impairment: Safety/judgement;Orientation Orientation Level: Disoriented to;Time     Following Commands: Follows multi-step commands with increased time Safety/Judgement: Decreased awareness of safety;Decreased awareness of deficits   Problem Solving: Requires verbal cues General Comments: Frequent cues for direction/safety              Home Living Family/patient expects to be discharged to::  (prefers to stay with where lives. ) Living Arrangements: Alone Available Help at Discharge: Family Type of Home: Mobile home Home Access: Stairs to enter [employer] of Steps:  7 Entrance Stairs-Rails: Right;Left Home Layout: One level               Home Equipment: Walker - 2 wheels;Tub bench          Prior Functioning/Environment Level of Independence: Independent             OT  Diagnosis: Generalized weakness;Cognitive deficits   OT Problem List: Decreased cognition;Decreased safety awareness;Impaired balance (sitting and/or standing);Decreased knowledge of use of DME or AE   OT Treatment/Interventions: Self-care/ADL training;Patient/family education;Balance training;DME and/or AE instruction    OT Goals(Current goals can be found in the care plan section) Acute Rehab OT Goals Patient Stated Goal: to go home OT Goal Formulation: With patient Time For Goal Achievement: 03/05/15 Potential to Achieve Goals: Good  OT Frequency: Min 2X/week   Barriers to D/C: Decreased caregiver support          Co-evaluation PT/OT/SLP Co-Evaluation/Treatment: Yes Reason for Co-Treatment: For patient/therapist safety (Pt had been agitated and in restraints.) PT goals addressed during session: Mobility/safety with mobility;Balance;Strengthening/ROM OT goals addressed during session: ADL's and self-care;Strengthening/ROM      End of Session Equipment Utilized During Treatment: Gait belt;Rolling walker Nurse Communication: Mobility status  Activity Tolerance: Patient tolerated treatment well Patient left: in chair;with call bell/phone within reach;with chair alarm set   Time: 1110-1144 OT Time Calculation (min): 34 min Charges:  OT General Charges $OT Visit: 1 Procedure OT Evaluation $Initial OT Evaluation Tier I: 1 Procedure G-Codes: OT G-codes **NOT FOR INPATIENT CLASS** Functional Assessment Tool Used: Clinical observation Functional Limitation: Self care Self Care Current Status [id number]): At least 20 percent but less than 40 percent impaired, limited or restricted Self Care Goal Status [id number]): At least 1 percent but less than 20 percent impaired, limited or restricted  Almon Register [id number] 02/26/2015, 1:55 PM

## 2015-02-26 NOTE — Evaluation (Signed)
Physical Therapy Evaluation Patient Details Name: Harold Martin MRN: [id number] DOB: 28-Apr-1941 Today's Date: 02/26/2015   History of Present Illness  History of diabetes, hypertension, hypothyroidism, COPD, TIA. The patient presents to the hospital with slurred speech that started sometime midmorning. The symptoms continued and did not improve. MRI negative. Pt here 3 weeks ago with encephalopathy.  Clinical Impression  Patient presents with functional limitations due to deficits listed in PT problem list (see below). Pt presents with impulsivity, decrease in oxygen saturation during ambulation, fatigue and balance deficits impacting safe mobility. Pt high falls risk due to above. Pt not safe to return home without 24/7 S/assist. Would benefit from [facility] to maximize independence and mobility prior to return home.    Follow Up Recommendations SNF;Supervision/Assistance - 24 hour    Equipment Recommendations  None recommended by PT    Recommendations for Other Services       Precautions / Restrictions Precautions Precautions: Fall Precaution Comments: monitor O2 Restrictions Weight Bearing Restrictions: No      Mobility  Bed Mobility Overal bed mobility: Needs Assistance Bed Mobility: Supine to Sit     Supine to sit: Min guard;HOB elevated     General bed mobility comments: Min guard for safety. Impulsive to get to EOB. "let me get my head in order." Use of rails.  Transfers Overall transfer level: Needs assistance Equipment used: Rolling walker (2 wheeled) Transfers: Sit to/from Stand Sit to Stand: Min guard         General transfer comment: Min guard for safety. Impulsive to stand from EOB. Transferred to chair post ambulation bout.  Ambulation/Gait Ambulation/Gait assistance: Min assist Ambulation Distance (Feet): 100 Feet Assistive device: Rolling walker (2 wheeled) Gait Pattern/deviations: Step-through pattern;Decreased stride length;Drifts  right/left;Staggering right;Staggering left Gait velocity: Increased - unsafe   General Gait Details: Very unsteady gait with difficulty staying within RW during turns, Drifting to the right with right lateral lean. Increased speed. Unsafe with assist due to LOB. Sa02 mid 80s on RA. Dyspnea present.   Stairs            Wheelchair Mobility    Modified Rankin (Stroke Patients Only)       Balance Overall balance assessment: Needs assistance Sitting-balance support: Feet supported;No upper extremity supported Sitting balance-Leahy Scale: Fair     Standing balance support: During functional activity Standing balance-Leahy Scale: Poor Standing balance comment: Relient on RW for support and impulsivity.                             Pertinent Vitals/Pain Pain Assessment: No/denies pain    Home Living Family/patient expects to be discharged to::  (prefers to stay with where lives. ) Living Arrangements: Alone Available Help at Discharge: Family Type of Home: Mobile home Home Access: Stairs to enter Entrance Stairs-Rails: Psychiatric nurse of Steps: 7 Home Layout: One level Home Equipment: Environmental consultant - 2 wheels;Tub bench      Prior Function Level of Independence: Independent               Hand Dominance   Dominant Hand: Left    Extremity/Trunk Assessment   Upper Extremity Assessment: Defer to OT evaluation           Lower Extremity Assessment: Generalized weakness      Cervical / Trunk Assessment: Normal  Communication   Communication: HOH (dysarthric)  Cognition Arousal/Alertness: Awake/alert Behavior During Therapy: Impulsive Overall Cognitive Status: Impaired/Different from baseline  Area of Impairment: Safety/judgement;Orientation Orientation Level: Disoriented to;Time     Following Commands: Follows multi-step commands with increased time Safety/Judgement: Decreased awareness of safety;Decreased awareness of deficits    Problem Solving: Requires verbal cues General Comments: Frequent cues for direction/safety    General Comments      Exercises        Assessment/Plan    PT Assessment Patient needs continued PT services  PT Diagnosis Difficulty walking   PT Problem List Decreased strength;Cardiopulmonary status limiting activity;Decreased activity tolerance;Decreased balance;Decreased mobility;Decreased safety awareness;Decreased knowledge of use of DME  PT Treatment Interventions Balance training;Gait training;Functional mobility training;Therapeutic activities;Therapeutic exercise;Patient/family education;Stair training   PT Goals (Current goals can be found in the Care Plan section) Acute Rehab PT Goals Patient Stated Goal: to go home PT Goal Formulation: With patient Time For Goal Achievement: 03/12/15 Potential to Achieve Goals: Fair    Frequency Min 2X/week   Barriers to discharge Decreased caregiver support      Co-evaluation PT/OT/SLP Co-Evaluation/Treatment: Yes Reason for Co-Treatment: For patient/therapist safety (Pt had been agitated and in restraints.) PT goals addressed during session: Mobility/safety with mobility;Balance;Strengthening/ROM OT goals addressed during session: ADL's and self-care;Strengthening/ROM       End of Session Equipment Utilized During Treatment: Gait belt Activity Tolerance: Treatment limited secondary to medical complications (Comment);Patient limited by fatigue (decrease in Sa02.) Patient left: in chair;with call bell/phone within reach;with chair alarm set;Other (comment) (MD present) Nurse Communication: Mobility status    Functional Assessment Tool Used: Clinical judgment Functional Limitation: Mobility: Walking and moving around Mobility: Walking and Moving Around Current Status [id number]): At least 20 percent but less than 40 percent impaired, limited or restricted Mobility: Walking and Moving Around Goal Status [id number]): At least 1 percent but  less than 20 percent impaired, limited or restricted    Time: [id number] PT Time Calculation (min) (ACUTE ONLY): 27 min   Charges:   PT Evaluation $Initial PT Evaluation Tier I: 1 Procedure     PT G Codes:   PT G-Codes **NOT FOR INPATIENT CLASS** Functional Assessment Tool Used: Clinical judgment Functional Limitation: Mobility: Walking and moving around Mobility: Walking and Moving Around Current Status [id number]): At least 20 percent but less than 40 percent impaired, limited or restricted Mobility: Walking and Moving Around Goal Status [phone]): At least 1 percent but less than 20 percent impaired, limited or restricted    [place] 02/26/2015, 1:26 PM  Wray Kearns, [place], DPT [phone]

## 2015-02-26 NOTE — Progress Notes (Signed)
SLP Cancellation Note  Patient Details Name: Harold Martin MRN: [id number] DOB: Jul 19, 1940   Cancelled treatment:       Reason Eval/Treat Not Completed: Patient's level of consciousness. Patient arousible but not alert enough for evaluation. Will f/u 8/23.   Gabriel Rainwater MA, CCC-SLP [phone]    Gabriel Rainwater Meryl 02/26/2015, 10:43 AM

## 2015-02-27 DIAGNOSIS — G9340 Encephalopathy, unspecified: Secondary | ICD-10-CM | POA: Diagnosis not present

## 2015-02-27 LAB — GLUCOSE, CAPILLARY
GLUCOSE-CAPILLARY: 197 mg/dL — AB (ref 65–99)
GLUCOSE-CAPILLARY: 322 mg/dL — AB (ref 65–99)
Glucose-Capillary: 336 mg/dL — ABNORMAL HIGH (ref 65–99)

## 2015-02-27 MED ORDER — AZITHROMYCIN 250 MG PO TABS: 250.0000 mg | ORAL_TABLET | Freq: Every day | ORAL | Status: DC

## 2015-02-27 MED ORDER — ASPIRIN 81 MG PO CHEW: 81.0000 mg | CHEWABLE_TABLET | Freq: Every day | ORAL | Status: AC

## 2015-02-27 MED ORDER — LEVOTHYROXINE SODIUM 125 MCG PO TABS: 125.0000 ug | ORAL_TABLET | Freq: Every day | ORAL | Status: DC

## 2015-02-27 MED ORDER — IPRATROPIUM-ALBUTEROL 0.5-2.5 (3) MG/3ML IN SOLN: 3.0000 mL | Freq: Four times a day (QID) | RESPIRATORY_TRACT | Status: DC

## 2015-02-27 MED ORDER — IPRATROPIUM-ALBUTEROL 0.5-2.5 (3) MG/3ML IN SOLN: 3.0000 mL | RESPIRATORY_TRACT | Status: DC | PRN

## 2015-02-27 MED ORDER — GUAIFENESIN ER 600 MG PO TB12: 600.0000 mg | ORAL_TABLET | Freq: Two times a day (BID) | ORAL | Status: DC

## 2015-02-27 NOTE — Clinical Social Work Placement (Signed)
   CLINICAL SOCIAL WORK PLACEMENT  NOTE  Date:  02/27/2015  Patient Details  Name: Harold Martin MRN: [id number] Date of Birth: [birth year]  Clinical Social Work is seeking post-discharge placement for this patient at the [place] level of care (*CSW will initial, date and re-position this form in  chart as items are completed):  Yes   Patient/family provided with [facility] Work Department's list of facilities offering this level of care within the geographic area requested by the patient (or if unable, by the patient's family).  Yes   Patient/family informed of their freedom to choose among providers that offer the needed level of care, that participate in Medicare, Medicaid or managed care program needed by the patient, have an available bed and are willing to accept the patient.  Yes   Patient/family informed of [facility]'s ownership interest in [facility] and [facility] [address], as well as of the fact that they are under no obligation to receive care at these facilities.  PASRR submitted to EDS on       PASRR number received on       Existing PASRR number confirmed on 02/27/15     FL2 transmitted to all facilities in geographic area requested by pt/family on 02/27/15     FL2 transmitted to all facilities within larger geographic area on       Patient informed that his/her managed care company has contracts with or will negotiate with certain facilities, including the following:            Patient/family informed of bed offers received.  Patient chooses bed at       Physician recommends and patient chooses bed at      Patient to be transferred to   on  .  Patient to be transferred to facility by       Patient family notified on   of transfer.  Name of family member notified:        PHYSICIAN       Additional Comment:    _______________________________________________ Greta Doom, LCSW 02/27/2015, 10:44 AM

## 2015-02-27 NOTE — Clinical Social Work Psychosocial (Signed)
Clinical Social Work Assessment  Patient Details  Name: Harold Martin MRN: [id number] Date of Birth: [birth year]  Date of referral: 02/26/15   Reason for consult: Facility Placement     Housing/Transportation Living arrangements for the past 2 months: Single Family Home Source of Information: Adult Children ([name],[name]) Patient Interpreter Needed: None Criminal Activity/Legal Involvement Pertinent to Current Situation/Hospitalization: No - Comment as needed Significant Relationships: Adult Children, Spouse Lives with: Adult Children Do you feel safe going back to the place where you live? No Need for family participation in patient care: Yes (Comment)  Care giving concerns: N/A   Facilities manager / plan: CSW spoke with the pt's daughter Harold Martin. CSW introduced self and purpose of the visit. Harold Martin reported that the pt was home for a week and a half prior to being admitted to the hospital. Harold Martin reported that the pt refused to go back to [facility]. Harold Martin reported that he was there for 2 weeks. Harold Martin shared that she and the pt would like the pt to go to [place] because his wife is there. CSW remained Harold Martin that [employer] is out of network with the pt's current PPO plan. Harold Martin acknowledged that she knows, but the pt would like to be close to his wife. CSW contacted Fransisco Beau at [facility] to see if Fransisco Beau could accept the pt. CSW answered all questions in which the [place] inquired about. CSW will continue to follow this pt and assist with discharge as needed.   Employment status: Retired Engineer, manufacturing systems PT Recommendations: [place] / Referral to community resources: [place]  Patient/Family's Response to care: Harold Martin reported that the care in which the pt has received has been good.    Patient/Family's Understanding of and Emotional Response to Diagnosis, Current Treatment, and Prognosis: Harold Martin acknowledged the pt's current diagnosis. Harold Martin shared that she would like the pt to get better and remain out of the hospital.    Emotional Assessment Appearance:  (Unable to Assess) Attitude/Demeanor/Rapport: Unable to Assess Affect (typically observed): Unable to Assess Orientation:  (Disoriented ) Alcohol / Substance use: Not Applicable Psych involvement (Current and /or in the community): No (Comment)  Discharge Needs  Concerns to be addressed: Denies Needs/Concerns at this time Readmission within the last 30 days: Yes Current discharge risk: None Barriers to Discharge: No Barriers Identified   Tyus Kallam, LCSW 02/27/2015, 10:28 AM

## 2015-02-27 NOTE — Progress Notes (Signed)
Attempted to give report to Oval Linsey was on hold for 10 minutes will call back.

## 2015-02-27 NOTE — Clinical Social Work Placement (Signed)
   CLINICAL SOCIAL WORK PLACEMENT  NOTE  Date:  02/27/2015  Patient Details  Name: Harold Martin MRN: [id number] Date of Birth: 1940-11-06  Clinical Social Work is seeking post-discharge placement for this patient at the [place] level of care (*CSW will initial, date and re-position this form in  chart as items are completed):  Yes   Patient/family provided with [place] Work Department's list of facilities offering this level of care within the geographic area requested by the patient (or if unable, by the patient's family).  Yes   Patient/family informed of their freedom to choose among providers that offer the needed level of care, that participate in Medicare, Medicaid or managed care program needed by the patient, have an available bed and are willing to accept the patient.  Yes   Patient/family informed of [place] ownership interest in [facility] and [facility], as well as of the fact that they are under no obligation to receive care at these facilities.  PASRR submitted to EDS on       PASRR number received on       Existing PASRR number confirmed on 02/27/15     FL2 transmitted to all facilities in geographic area requested by pt/family on 02/27/15     FL2 transmitted to all facilities within larger geographic area on       Patient informed that his/her managed care company has contracts with or will negotiate with certain facilities, including the following:        Yes   Patient/family informed of bed offers received.  Patient chooses bed at [facility] and [place] recommends and patient chooses bed at      Patient to be transferred to [facility] on 02/27/15.  Patient to be transferred to facility by [facility]      Patient family notified on 02/27/15 of transfer.  Name of family member notified:  [name],[name] daughter     PHYSICIAN       Additional Comment:     _______________________________________________ Greta Doom, LCSW 02/27/2015, 12:58 PM

## 2015-02-27 NOTE — Progress Notes (Signed)
Patient being discharged to [facility] report called assigned to unit 206 station 1, [facility] will transport, no complaints of pain, IV removed.

## 2015-02-27 NOTE — Discharge Summary (Signed)
Physician Discharge Summary  Harold Martin [id number] DOB: February 11, 1941 DOA: 02/24/2015  PCP: Dwan Bolt, MD  Admit date: 02/24/2015 Discharge date: 02/27/2015  Time spent: Greater than 30 minutes  Recommendations for Outpatient Follow-up:  1. To skilled nursing facility 2. Speech therapy to follow to see whether diet may be advanced. 3. Patient is to wear 1-2 L of oxygen at all times, recommend against discontinuing regardless of resting room air saturation.  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  Discharge Condition: Stable  Diet recommendation: Puree with nectar thickened liquids. Speech therapy to follow for potential advancement of diet.  Filed Weights   02/24/15 2305 02/25/15 0115  Weight: 106.142 kg (234 lb) 97 kg (213 lb 13.5 oz)    History of present illness:   74 y.o. male  History of diabetes, hypertension, hypothyroidism, COPD, TIA, recent prolonged admission for encephalopathy felt to be secondary to hypercarbia and hypoxia. The patient presents to the hospital as a code stroke with slurred speech that started sometime midmorning. The symptoms continued and did not improve. The patient was seen by his granddaughter around 3 PM, who noticed slurred speech and mental status changes. The patient went fishing with his granddaughter, and finally called EMS at 10 PM for the worsening slurred speech. No palliating or provoking factors. In the emergency department the patient was evaluated by neurology, who determined that it was not appropriate for thrombolysis. Neurology recommended admission with a stroke workup.  Hospital Course:  Patient's presentation is almost  identical to previous admission at which time he was worked up extensively for encephalopathy. It was ultimately felt to be secondary to hypercarbia, hypoxia and COPD exacerbation. His daughter describes a previous admission at outside hospital with similar presentation. Stroke workup was obtained, but no evidence this was stroke or TIA. When ambulating with physical therapy, his oxygen saturations dropped into the 80s. Previous admission, he had been discharged on 1 L of oxygen but it does not appear that this occurred. I suspect he suffers from transient hypoxia. On initial exam, his breath sounds were very quiet and he was coughing up purulent sputum. After bronchodilators and azithromycin and Mucinex, his breath sounds are better with better air movement. Suspect acute bronchitis leading to COPD exacerbation leading to subsequent encephalopathy. Patient has work with physical therapy occupational therapy and speech therapy. He is not safe to be alone. We're looking into skilled nursing facility. Speech therapy recommends dysphagia 1 diet/. With nectar thickened liquids and continued speech therapy evaluation to see whether his diet may be advanced. Would keep oxygen on at all times regardless of resting room air saturation, as this seems to improve his encephalopathy during this hospitalization and in the past. He did not require steroids during this hospitalization. Other medical problems remain stable.  Procedures:  none  Consultations:  neurology  Discharge Exam: Filed Vitals:   02/27/15 1000  BP: 145/61  Pulse: 82  Temp: 97.9 F (36.6 C)  Resp: 20    General: Alert. Appropriate but forgetful. Remembers me but not my name. Cardiovascular: Regular rate rhythm without murmurs gallops rubs Respiratory: Better air movement. No wheezes rhonchi or rales Neurologic nonfocal.  Discharge Instructions   Medication List    STOP taking these medications  albuterol (2.5 MG/3ML) 0.083%  nebulizer solution  Commonly known as:  PROVENTIL     insulin aspart 100 UNIT/ML injection  Commonly known as:  novoLOG     RESOURCE THICKENUP CLEAR Powd      TAKE these medications        amLODipine 5 MG tablet  Commonly known as:  NORVASC  Take 5 mg by mouth every morning.     aspirin 81 MG chewable tablet  Commonly known as:  ASPIRIN CHILDRENS  Chew 1 tablet (81 mg total) by mouth daily.     azithromycin 250 MG tablet  Commonly known as:  ZITHROMAX  Take 1 tablet (250 mg total) by mouth daily. Through 03/02/15, then stop     chlorhexidine 0.12 % solution  Commonly known as:  PERIDEX  15 mLs by Mouth Rinse route 2 (two) times daily.     glipiZIDE 10 MG tablet  Commonly known as:  GLUCOTROL  Take 10 mg by mouth daily before breakfast.     guaiFENesin 600 MG 12 hr tablet  Commonly known as:  MUCINEX  Take 1 tablet (600 mg total) by mouth 2 (two) times daily. For 1 week, then as needed for cough     insulin glargine 100 UNIT/ML injection  Commonly known as:  LANTUS  Inject 0.1 mLs (10 Units total) into the skin at bedtime.     ipratropium-albuterol 0.5-2.5 (3) MG/3ML Soln  Commonly known as:  DUONEB  Take 3 mLs by nebulization 4 (four) times daily.     levothyroxine 125 MCG tablet  Commonly known as:  SYNTHROID, LEVOTHROID  Take 1 tablet (125 mcg total) by mouth daily before breakfast.     LINZESS 290 MCG Caps capsule  Generic drug:  Linaclotide  Take 290 mcg by mouth daily.     lisinopril 20 MG tablet  Commonly known as:  PRINIVIL,ZESTRIL  Take 20 mg by mouth daily.     magnesium oxide 400 MG tablet  Commonly known as:  MAG-OX  Take 400 mg by mouth daily.     metoprolol succinate 25 MG 24 hr tablet  Commonly known as:  TOPROL-XL  Take 25 mg by mouth daily.     sitaGLIPtin 100 MG tablet  Commonly known as:  JANUVIA  Take 100 mg by mouth daily.     tamsulosin 0.4 MG Caps capsule  Commonly known as:  FLOMAX  Take 0.4 mg by mouth daily at 12 noon.      Vitamin B-12 2500 MCG Subl  Place 1 tablet under the tongue daily.        Discharge Instructions    Walk with assistance    Complete by:  As directed            Allergies  Allergen Reactions  . Prednisone     REACTION: Reaction not known      The results of significant diagnostics from this hospitalization (including imaging, microbiology, ancillary and laboratory) are listed below for reference.    Significant Diagnostic Studies: Dg Chest 2 View  02/25/2015   CLINICAL DATA:  Syncope and altered mental status  EXAM: CHEST  2 VIEW  COMPARISON:  01/24/2015  FINDINGS: Hypoventilation with streaky lower lobe opacities in the lateral projection. There is no edema, effusion, or pneumothorax. Borderline cardiomegaly, accentuated by technique. Negative aortic and hilar contours.  IMPRESSION: Low volumes with lower lobe opacities favoring atelectasis or aspiration.   Electronically Signed   By: Monte Fantasia M.D.   On: 02/25/2015  00:17   Ct Head Wo Contrast  02/24/2015   CLINICAL DATA:  Code stroke. Slurred speech and loss of consciousness. Severe confusion. Initial encounter.  EXAM: CT HEAD WITHOUT CONTRAST  TECHNIQUE: Contiguous axial images were obtained from the base of the skull through the vertex without intravenous contrast.  COMPARISON:  CT of the head performed 01/16/2015, and MRI of the brain performed 01/20/2015  FINDINGS: There is no evidence of acute infarction, mass lesion, or intra- or extra-axial hemorrhage on CT.  Prominence of the ventricles and sulci suggests mild cortical volume loss. Mild periventricular white matter change likely reflects small vessel ischemic microangiopathy. Decreased attenuation at the right frontal lobe is thought to reflect beam hardening artifact.  The brainstem and fourth ventricle are within normal limits. The basal ganglia are unremarkable in appearance. The cerebral hemispheres demonstrate grossly normal gray-white differentiation. No mass effect  or midline shift is seen.  There is no evidence of fracture; visualized osseous structures are unremarkable in appearance. The orbits are within normal limits. The paranasal sinuses and mastoid air cells are well-aerated. No significant soft tissue abnormalities are seen.  IMPRESSION: 1. No acute intracranial pathology seen on CT. 2. Mild cortical volume loss and scattered small vessel ischemic microangiopathy. These results were called by telephone at the time of interpretation on 02/24/2015 at 10:59 pm to Dr. Armida Sans, who verbally acknowledged these results.   Electronically Signed   By: Garald Balding M.D.   On: 02/24/2015 23:01   Mr Brain Wo Contrast  02/25/2015   CLINICAL DATA:  Agitation. Confusion. Fell at 5 o'clock this morning, 5 hours ago. Trauma to the head. Worsen slurred speech and mental status changes of several days duration.  EXAM: MRI HEAD WITHOUT CONTRAST  TECHNIQUE: Multiplanar, multiecho pulse sequences of the brain and surrounding structures were obtained without intravenous contrast.  COMPARISON:  Head CT 02/24/2015.  MRI 01/20/2015.  FINDINGS: The study suffers from motion degradation. Diffusion imaging does not show any acute or subacute infarction. The brainstem and cerebellum are unremarkable. The cerebral hemispheres show mild atrophy but without evidence of old infarction. No mass lesion, hemorrhage, hydrocephalus or extra-axial collection.  IMPRESSION: Motion degraded exam. No acute or focal finding. No pathology identified.   Electronically Signed   By: Nelson Chimes M.D.   On: 02/25/2015 11:24   Dg Swallowing Func-speech Pathology  02/26/2015    Objective Swallowing Evaluation:   MBS Patient Details  Name: TREMAR DUPLANTIER MRN: [id number] Date of Birth: 17-Oct-1940  Today's Date: 02/26/2015 Time: SLP Start Time (ACUTE ONLY): 1303-SLP Stop Time (ACUTE ONLY): 1320 SLP Time Calculation (min) (ACUTE ONLY): 17 min  Past Medical History:  Past Medical History  Diagnosis Date  . Hypertension    . Diabetes mellitus without complication   . Hypothyroidism   . Shortness of breath   . TIA (transient ischemic attack)   . COPD (chronic obstructive pulmonary disease)    Past Surgical History:  Past Surgical History  Procedure Laterality Date  . Surgery for bladder cancer  yrs ago  . Esophagogastroduodenoscopy N/A 09/23/2013    Procedure: ESOPHAGOGASTRODUODENOSCOPY (EGD);  Surgeon: Beryle Beams,  MD;  Location: [facility];  Service: Endoscopy;  Laterality: N/A;  . Colonoscopy N/A 09/23/2013    Procedure: COLONOSCOPY;  Surgeon: Beryle Beams, MD;  Location: [facility]  [facility];  Service: Endoscopy;  Laterality: N/A;  . Givens capsule study N/A 10/07/2013    Procedure: GIVENS CAPSULE STUDY;  Surgeon: Beryle Beams, MD;   Location: [facility];  Service: Endoscopy;  Laterality: N/A;   HPI:  Other Pertinent Information: QUAMERE LEMAS is a 74 y.o. male with  history of diabetes, hypertension, hypothyroidism, COPD, TIA, admitted  with slurred speech. CXR 8/20: Low volumes with lower lobe opacities  favoring atelectasis or aspiration. Head CT without acute intracranial  abnormality. MRI pending. Recent discharge from [facility] 7/21 following  admission for encephalopathy. Seen by SLP at that time and recommendations  for dysphagia 2 diet with nectar thick liquids were made.   No Data Recorded  Assessment / Plan / Recommendation CHL IP CLINICAL IMPRESSIONS 02/26/2015  Therapy Diagnosis Moderate pharyngeal phase dysphagia  Clinical Impression Patient presents with a moderate pharyngeal phase  dysphagia characterized by decreased movement of the hyolaryngeal complex  and possible mild decrease in tight laryngeal closure due to presence of  cervical hardware at C3-4 from previous surgery. Patient with deep silent  penetration of thin liquids and moderate-severe vallecular residuals post  swallow which cued dry swallows assist to decrease. Airway protection  noted with pureed solids (patient declined soft solids due to missing   bottom dentition) and nectar thick liquids. Teaspoon presentation assist  to maintain bolus in vallecula prior to swallow and minimize residuals,  decreasing aspiration risk overall. Will advance diet and f/u at bedside  for improvements and potential to advance.       CHL IP TREATMENT RECOMMENDATION 02/26/2015  Treatment Recommendations Therapy as outlined in treatment plan below     CHL IP DIET RECOMMENDATION 02/26/2015  SLP Diet Recommendations Dysphagia 1 (Puree);Nectar  Liquid Administration via (None)  Medication Administration Crushed with puree  Compensations Slow rate;Small sips/bites;Multiple dry swallows after each  bite/sip  Postural Changes and/or Swallow Maneuvers (None)     CHL IP OTHER RECOMMENDATIONS 02/26/2015  Recommended Consults (None)  Oral Care Recommendations Oral care BID  Other Recommendations Order thickener from pharmacy;Prohibited food  (jello, ice cream, thin soups);Remove water pitcher        CHL IP FREQUENCY AND DURATION 02/26/2015  Speech Therapy Frequency (ACUTE ONLY) min 3x week  Treatment Duration 2 weeks              CHL IP REASON FOR REFERRAL 02/26/2015  Reason for Referral Objectively evaluate swallowing function               CHL IP GO 02/26/2015  Functional Assessment Tool Used skilled clinical judgement  Functional Limitations Swallowing  Swallow Current Status [id number]) CK  Swallow Goal Status [id number]) [place]  Swallow Discharge Status [id number]) (None)  Motor Speech Current Status [id number]) (None)  Motor Speech Goal Status UK:[id number]) (None)  Motor Speech Goal Status SA:931536) (None)  Spoken Language Comprehension Current Status [id number]) (None)  Spoken Language Comprehension Goal Status [id number]) (None)  Spoken Language Comprehension Discharge Status [phone]) (None)  Spoken Language Expression Current Status [phone]) (None)  Spoken Language Expression Goal Status [id number]) (None)  Spoken Language Expression Discharge Status [phone]) (None)  Attention Current Status [id number]) (None)  Attention Goal Status  EY:7266000) (None)  Attention Discharge Status PJ:4613913) (None)  Memory Current Status YL:3545582) (None)  Memory Goal Status CF:3682075) (None)  Memory Discharge Status QC:115444) (None)  Voice Current Status [id number]) (None)  Voice Goal Status EW:[id number]) (None)  Voice Discharge Status [id number]) (None)  Other Speech-Language Pathology Functional Limitation [phone]) (None)  Other Speech-Language Pathology Functional Limitation Goal Status [id number])  (None)  Other Speech-Language Pathology Functional Limitation Discharge Status  [phone]) (None)   Gabriel Rainwater [place], [place] [phone]  McCoy Leah Meryl 02/26/2015, 3:02 PM    Carotid Doppler The vertebral arteries appear patent with antegrade flow. - Findings consistent with 1-39 percent stenosis involving the right internal carotid artery and the left internal carotid artery. - Technically limited by patient cooperation, movements. Unable to obtain bilateral dista ICA.  Transthoracic echocardiogram done last month without source of embolus.  Lipid Panel     Component Value Date/Time   CHOL 180 02/25/2015 0220   TRIG 251* 02/25/2015 0220   HDL 25* 02/25/2015 0220   CHOLHDL 7.2 02/25/2015 0220   VLDL 50* 02/25/2015 0220   LDLCALC 105* 02/25/2015 0220    Microbiology: No results found for this or any previous visit (from the past 240 hour(s)).   Labs: Basic Metabolic Panel:  Recent Labs Lab 02/24/15 2237 02/24/15 2251 02/25/15 0220  NA 134* 135 134*  K 4.1 4.0 4.3  CL 97* 97* 99*  CO2 27  --  23  GLUCOSE 183* 184* 187*  BUN 17 20 16   CREATININE 1.68* 1.70* 1.56*  CALCIUM 9.4  --  8.8*   Liver Function Tests:  Recent Labs Lab 02/24/15 2237  AST 22  ALT 19  ALKPHOS 107  BILITOT 0.6  PROT 6.8  ALBUMIN 3.8   No results for input(s): LIPASE, AMYLASE in the last 168 hours. No results for input(s): AMMONIA in the last 168 hours. CBC:  Recent Labs Lab 02/24/15 2237 02/24/15 2251 02/25/15 0220  WBC 12.8*  --  12.7*  NEUTROABS 10.9*   --   --   HGB 10.2* 11.2* 10.2*  HCT 33.5* 33.0* 33.5*  MCV 82.5  --  81.9  PLT 203  --  200   Cardiac Enzymes: No results for input(s): CKTOTAL, CKMB, CKMBINDEX, TROPONINI in the last 168 hours. BNP: BNP (last 3 results)  Recent Labs  01/18/15 1035  BNP 200.6*    ProBNP (last 3 results) No results for input(s): PROBNP in the last 8760 hours.  CBG:  Recent Labs Lab 02/26/15 1112 02/26/15 1643 02/26/15 2124 02/27/15 0638 02/27/15 1121  GLUCAP 136* 168* 303* 197* 322*       Signed:  Savir Blanke L  [facility] 02/27/2015, 11:54 AM

## 2015-11-25 IMAGING — DX DG CHEST 2V
2 series · 2 of 2 positions shown · non-contrast
Comparison: 01/24/2015

CLINICAL DATA: Syncope and altered mental status

EXAM:
CHEST  2 VIEW

[chest lat]
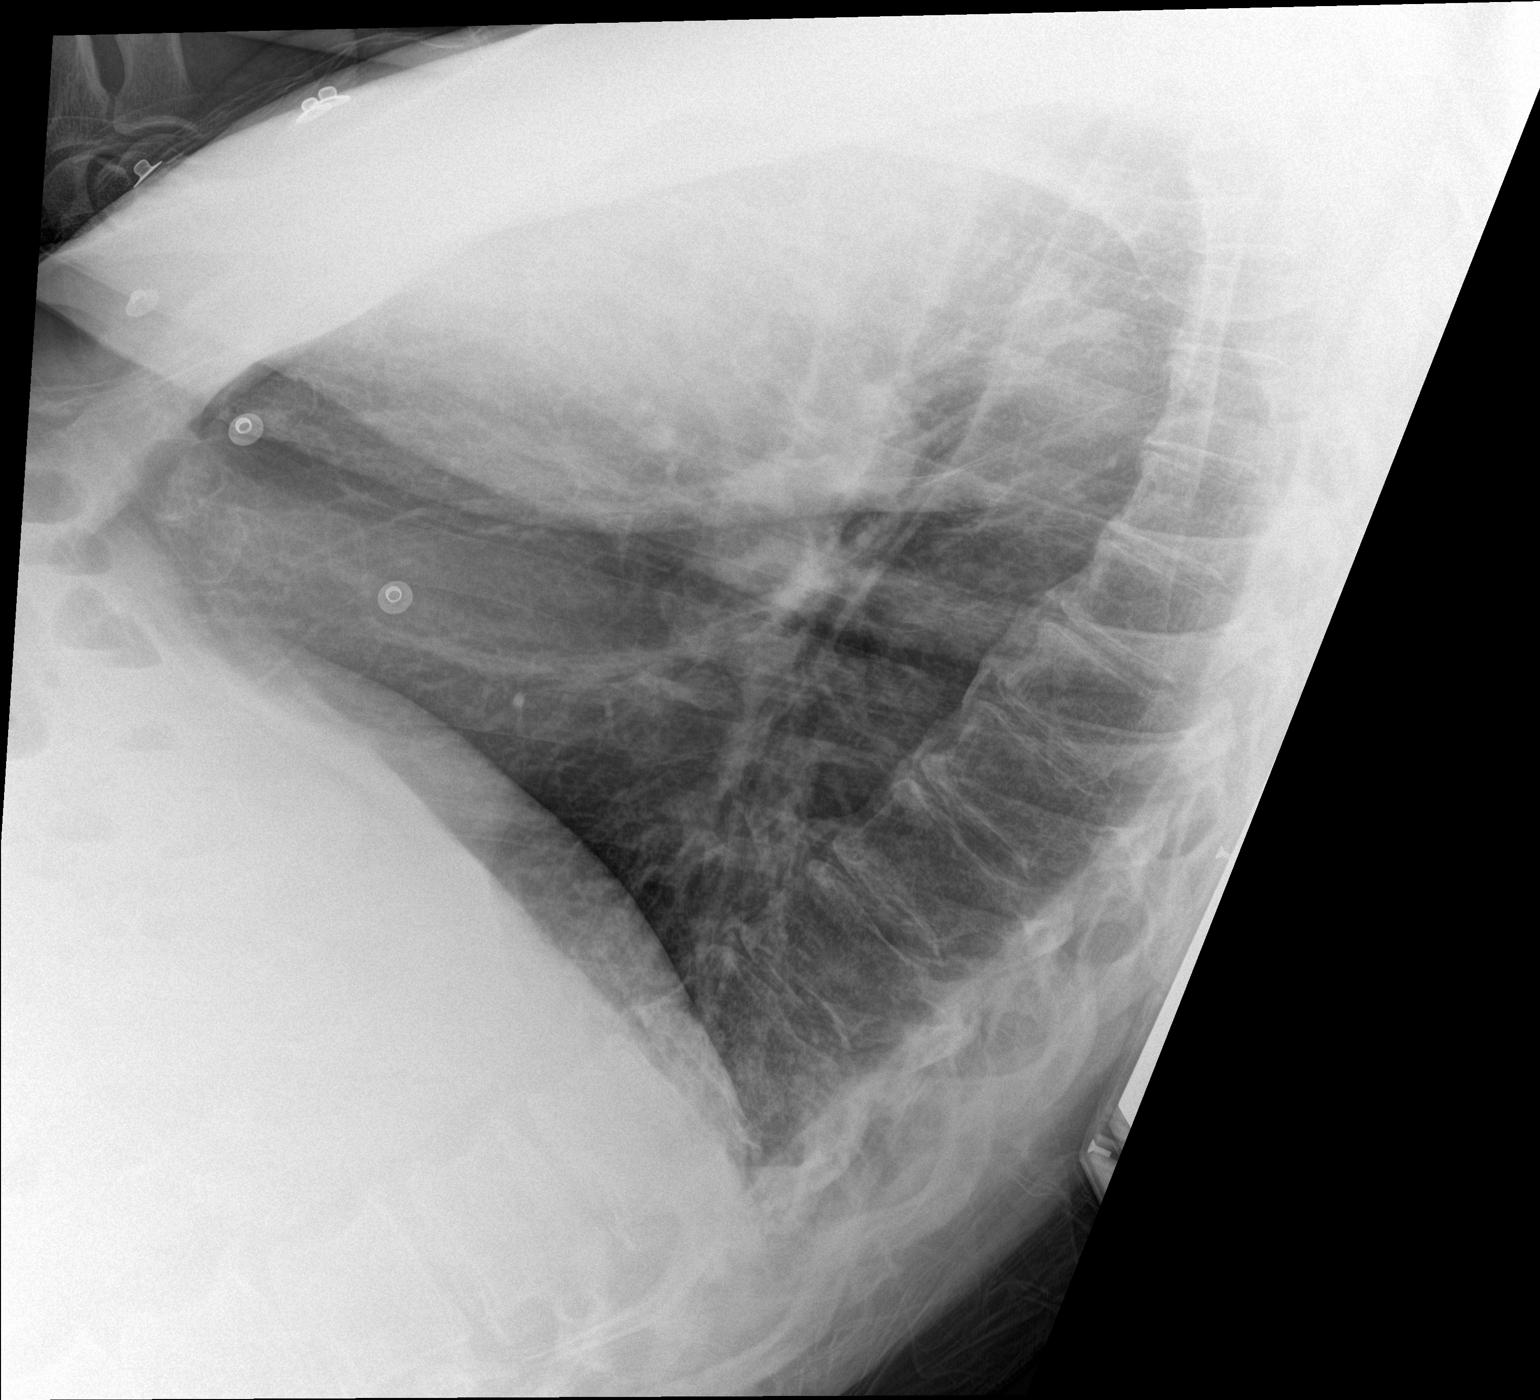

[chest ap]
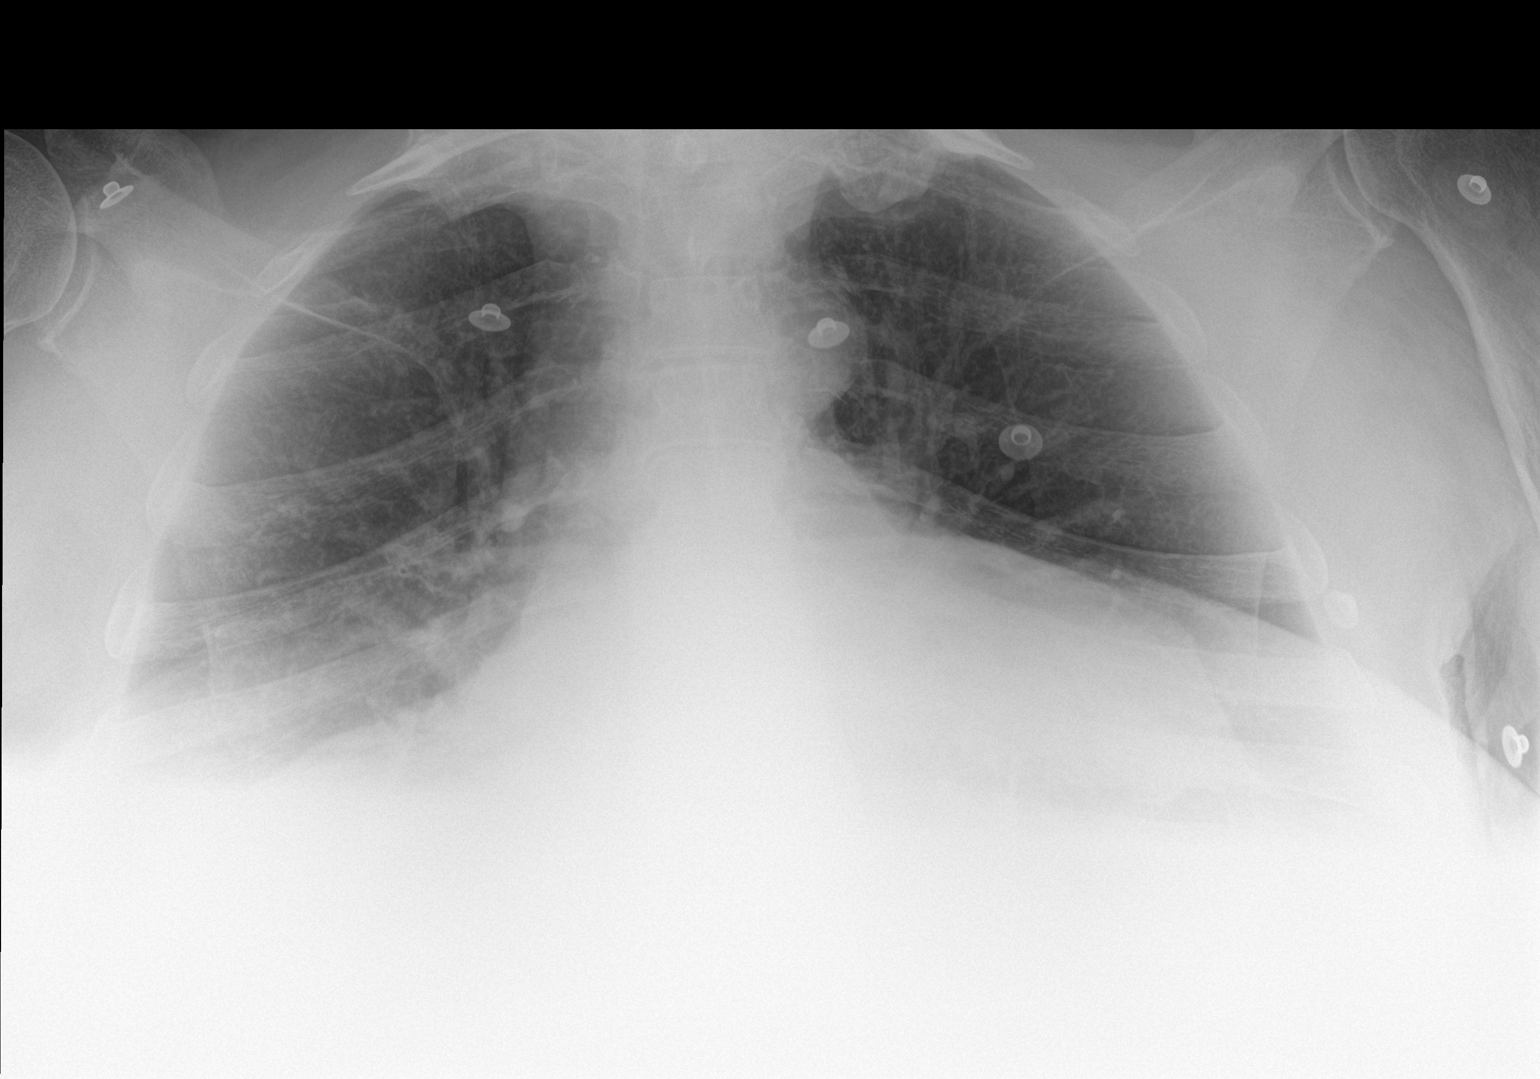

[2 of 2 positions shown; findings below may reference images not displayed]

FINDINGS: Hypoventilation with streaky lower lobe opacities in the lateral
projection. There is no edema, effusion, or pneumothorax. Borderline
cardiomegaly, accentuated by technique. Negative aortic and hilar
contours.
IMPRESSION: Low volumes with lower lobe opacities favoring atelectasis or
aspiration.

## 2015-11-25 IMAGING — CT CT HEAD W/O CM
1 series · 15 of 30 positions shown, 19 images · non-contrast
Comparison: CT of the head performed 01/16/2015, and MRI of the
brain performed 01/20/2015

CLINICAL DATA: Code stroke. Slurred speech and loss of
consciousness. Severe confusion. Initial encounter.

EXAM:
CT HEAD WITHOUT CONTRAST
TECHNIQUE: Contiguous axial images were obtained from the base of the skull
through the vertex without intravenous contrast.

[Series 2: head 5.0 h31s · axial · 0.44mm/px · z∈[-88,+72]mm · 15 of 36 slices shown, 19 images]
[im 2/36  brain]
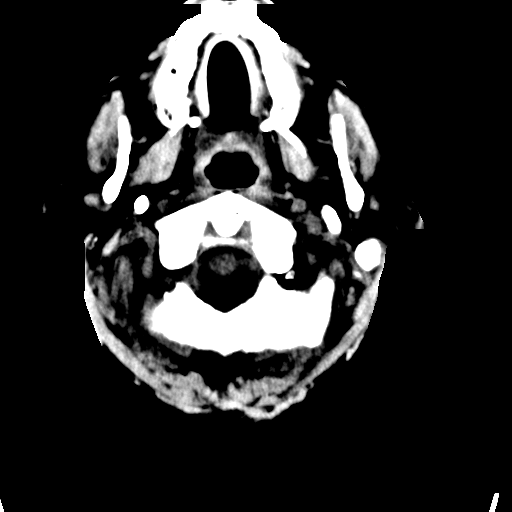
[im 2/36  bone]
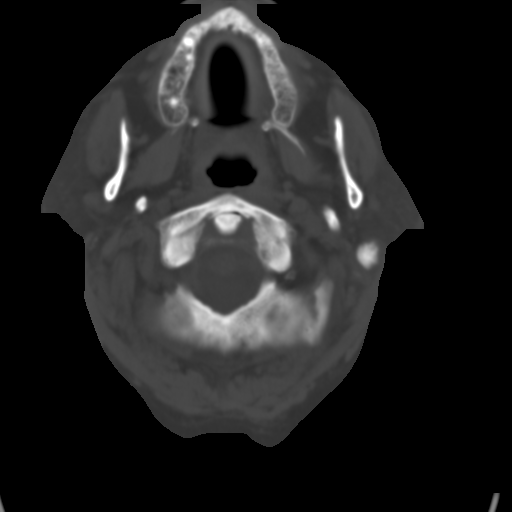
[im 4/36  brain]
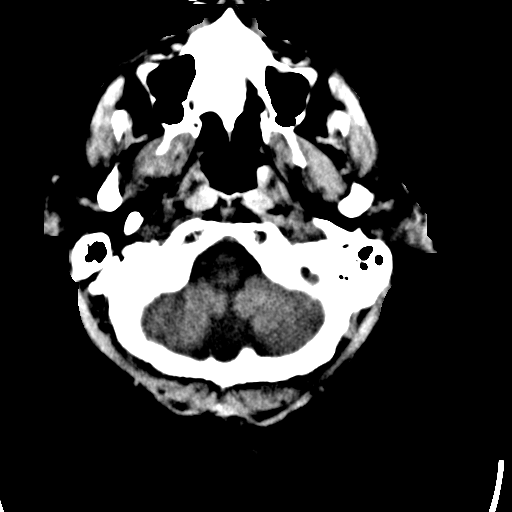
[im 7/36  brain]
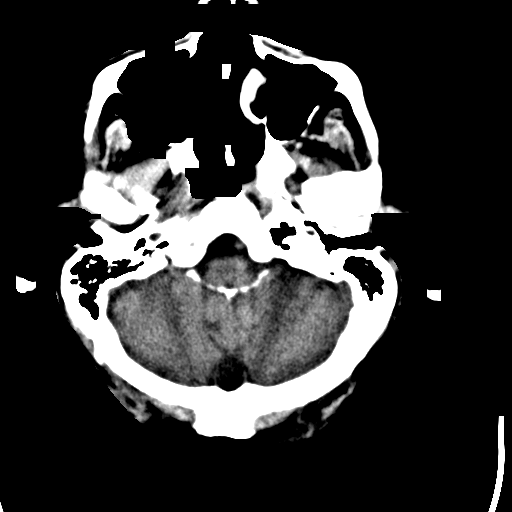
[im 9/36  brain]
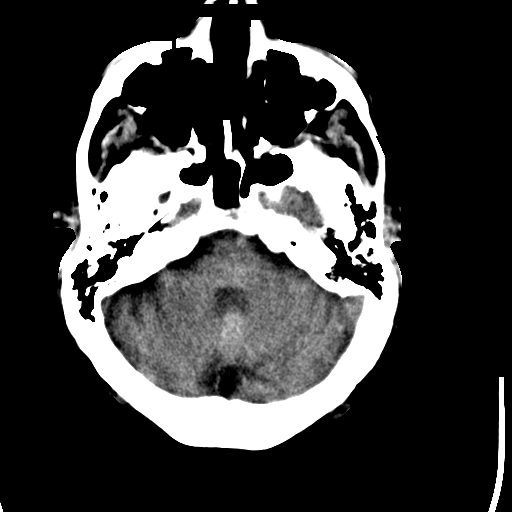
[im 11/36  brain]
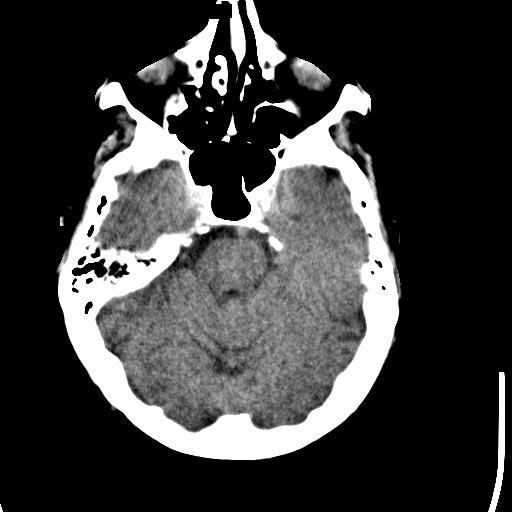
[im 11/36  bone]
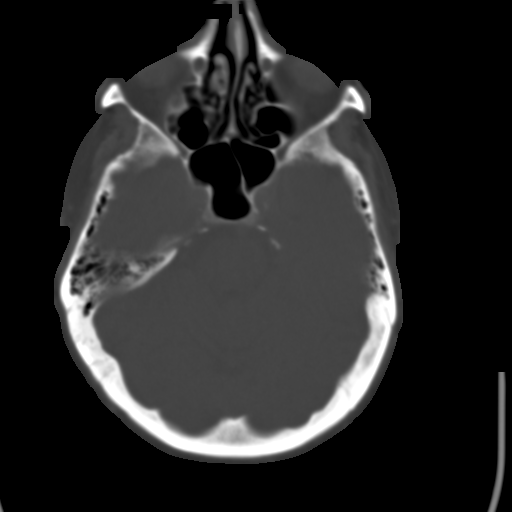
[im 14/36  brain]
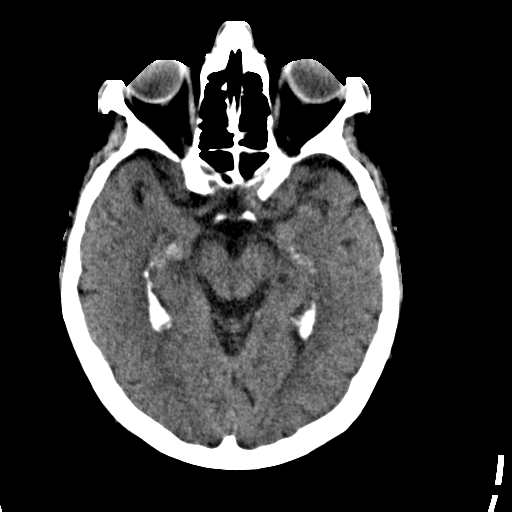
[im 16/36  brain]
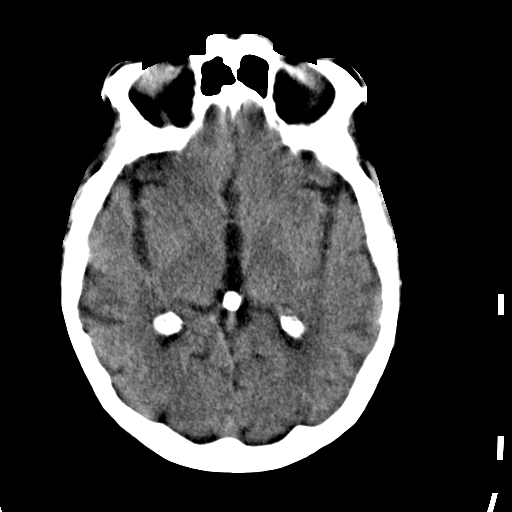
[im 19/36  brain]
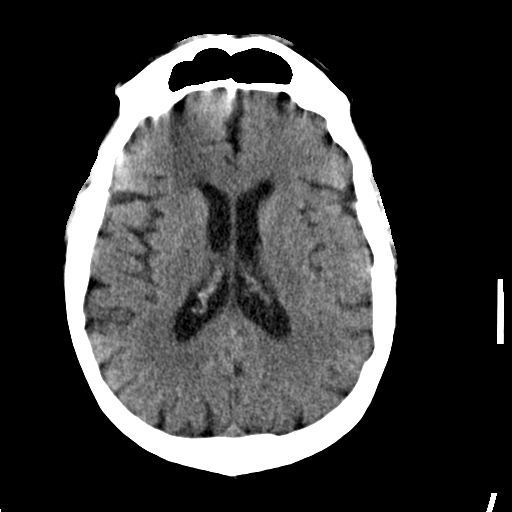
[im 20/36  brain]
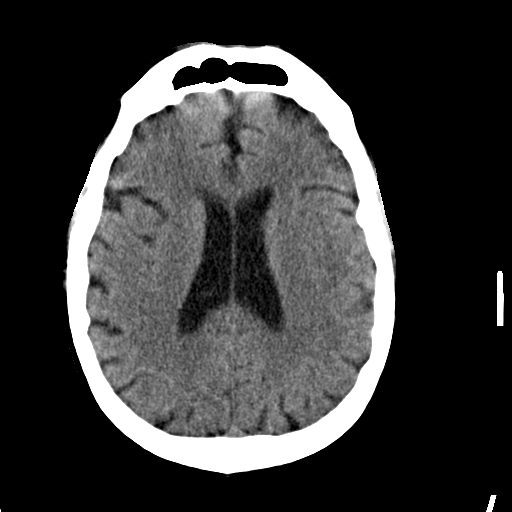
[im 20/36  bone]
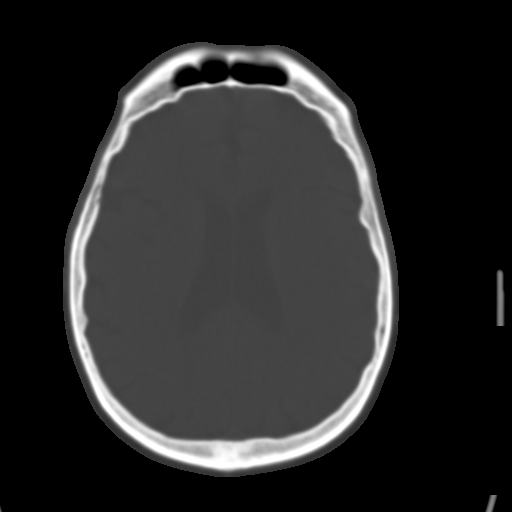
[im 22/36  brain]
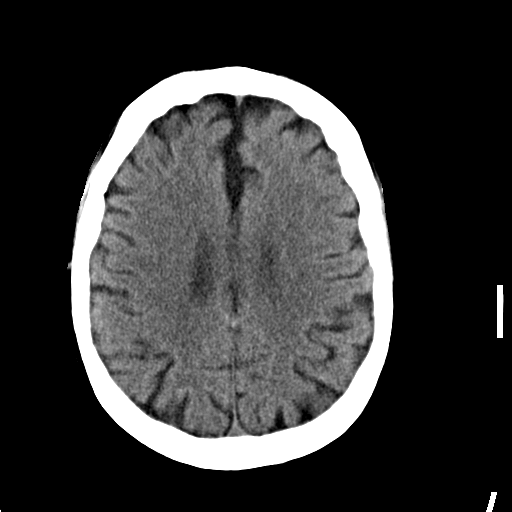
[im 25/36  brain]
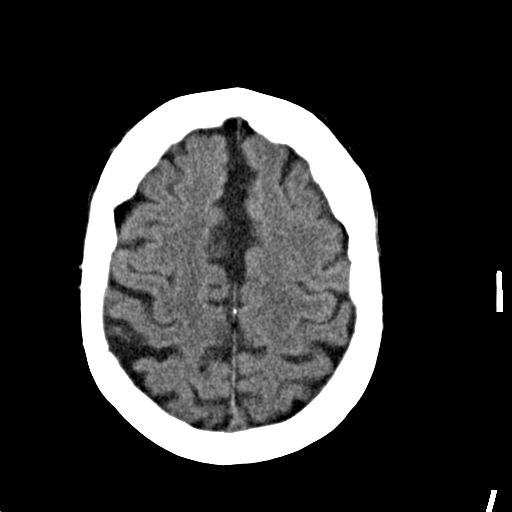
[im 27/36  brain]
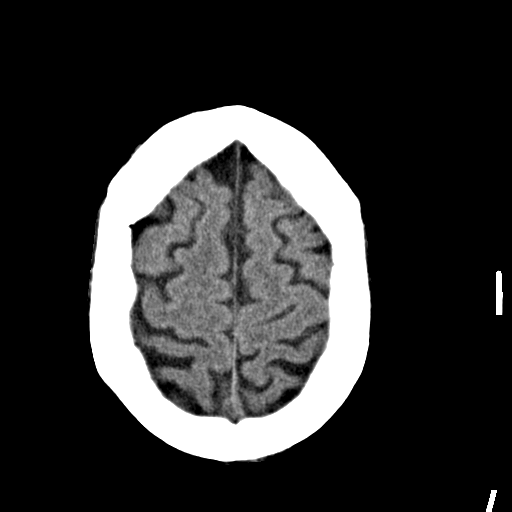
[im 29/36  brain]
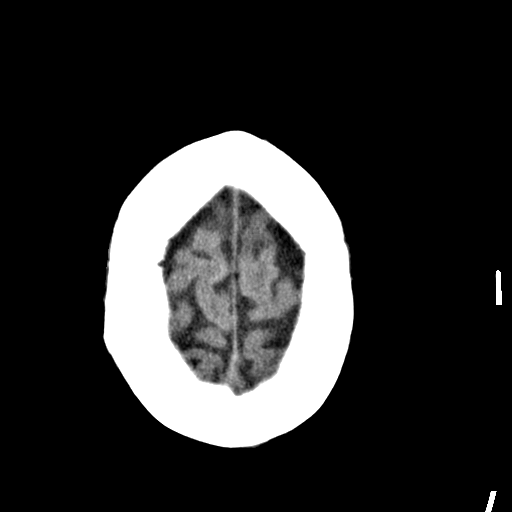
[im 29/36  bone]
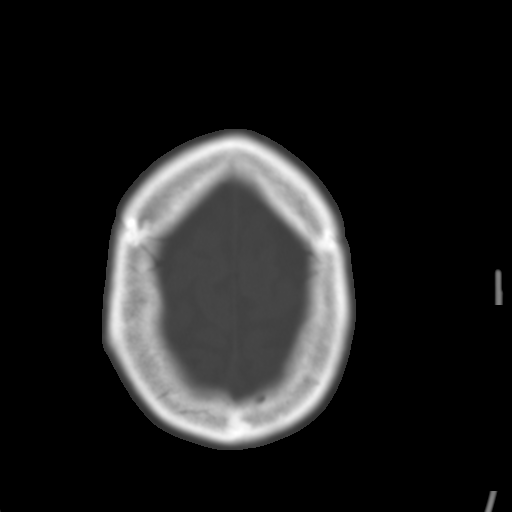
[im 32/36  brain]
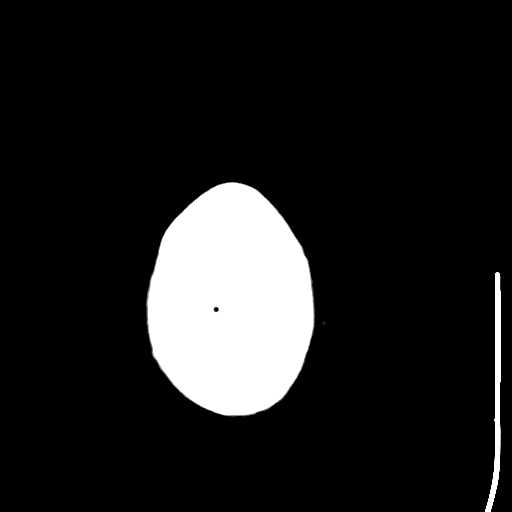
[im 34/36  brain]
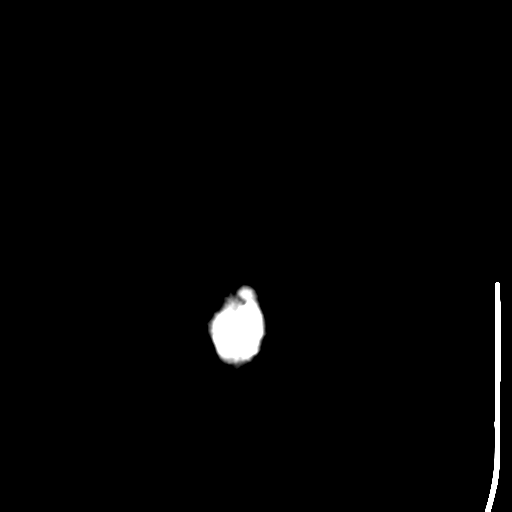

[15 of 30 positions shown; findings below may reference images not displayed]

FINDINGS: There is no evidence of acute infarction, mass lesion, or intra- or
extra-axial hemorrhage on CT.

Prominence of the ventricles and sulci suggests mild cortical volume
loss. Mild periventricular white matter change likely reflects small
vessel ischemic microangiopathy. Decreased attenuation at the right
frontal lobe is thought to reflect beam hardening artifact.

The brainstem and fourth ventricle are within normal limits. The
basal ganglia are unremarkable in appearance. The cerebral
hemispheres demonstrate grossly normal gray-white differentiation.
No mass effect or midline shift is seen.

There is no evidence of fracture; visualized osseous structures are
unremarkable in appearance. The orbits are within normal limits. The
paranasal sinuses and mastoid air cells are well-aerated. No
significant soft tissue abnormalities are seen.
IMPRESSION: 1. No acute intracranial pathology seen on CT.
2. Mild cortical volume loss and scattered small vessel ischemic
microangiopathy.
These results were called by telephone at the time of interpretation
on 02/24/2015 at [DATE] to Dr. Auad, who verbally acknowledged
these results.

## 2016-03-03 ENCOUNTER — Observation Stay (HOSPITAL_COMMUNITY)
Admission: EM | Admit: 2016-03-03 | Discharge: 2016-03-05 | Disposition: A | Discharge status: Skilled Nursing Facility | Payer: Medicare Other | Attending: Internal Medicine | Admitting: Internal Medicine

## 2016-03-03 ENCOUNTER — Emergency Department (HOSPITAL_COMMUNITY): Payer: Medicare Other

## 2016-03-03 ENCOUNTER — Encounter (HOSPITAL_COMMUNITY): Payer: Self-pay

## 2016-03-03 DIAGNOSIS — N183 Chronic kidney disease, stage 3 (moderate): Secondary | ICD-10-CM | POA: Diagnosis not present

## 2016-03-03 DIAGNOSIS — Z8551 Personal history of malignant neoplasm of bladder: Secondary | ICD-10-CM | POA: Insufficient documentation

## 2016-03-03 DIAGNOSIS — E039 Hypothyroidism, unspecified: Secondary | ICD-10-CM | POA: Insufficient documentation

## 2016-03-03 DIAGNOSIS — N179 Acute kidney failure, unspecified: Secondary | ICD-10-CM | POA: Diagnosis not present

## 2016-03-03 DIAGNOSIS — J449 Chronic obstructive pulmonary disease, unspecified: Secondary | ICD-10-CM | POA: Insufficient documentation

## 2016-03-03 DIAGNOSIS — E1122 Type 2 diabetes mellitus with diabetic chronic kidney disease: Secondary | ICD-10-CM | POA: Insufficient documentation

## 2016-03-03 DIAGNOSIS — G9340 Encephalopathy, unspecified: Principal | ICD-10-CM | POA: Insufficient documentation

## 2016-03-03 DIAGNOSIS — D649 Anemia, unspecified: Secondary | ICD-10-CM | POA: Insufficient documentation

## 2016-03-03 DIAGNOSIS — R531 Weakness: Secondary | ICD-10-CM | POA: Insufficient documentation

## 2016-03-03 DIAGNOSIS — Z8673 Personal history of transient ischemic attack (TIA), and cerebral infarction without residual deficits: Secondary | ICD-10-CM | POA: Diagnosis not present

## 2016-03-03 DIAGNOSIS — Z7982 Long term (current) use of aspirin: Secondary | ICD-10-CM | POA: Diagnosis not present

## 2016-03-03 DIAGNOSIS — Z794 Long term (current) use of insulin: Secondary | ICD-10-CM | POA: Insufficient documentation

## 2016-03-03 DIAGNOSIS — S0083XA Contusion of other part of head, initial encounter: Secondary | ICD-10-CM | POA: Insufficient documentation

## 2016-03-03 DIAGNOSIS — R4182 Altered mental status, unspecified: Secondary | ICD-10-CM

## 2016-03-03 DIAGNOSIS — Z66 Do not resuscitate: Secondary | ICD-10-CM | POA: Diagnosis not present

## 2016-03-03 DIAGNOSIS — W19XXXA Unspecified fall, initial encounter: Secondary | ICD-10-CM | POA: Insufficient documentation

## 2016-03-03 DIAGNOSIS — I10 Essential (primary) hypertension: Secondary | ICD-10-CM | POA: Diagnosis present

## 2016-03-03 DIAGNOSIS — E876 Hypokalemia: Secondary | ICD-10-CM | POA: Insufficient documentation

## 2016-03-03 DIAGNOSIS — S0512XA Contusion of eyeball and orbital tissues, left eye, initial encounter: Secondary | ICD-10-CM | POA: Insufficient documentation

## 2016-03-03 DIAGNOSIS — E1165 Type 2 diabetes mellitus with hyperglycemia: Secondary | ICD-10-CM | POA: Insufficient documentation

## 2016-03-03 DIAGNOSIS — E785 Hyperlipidemia, unspecified: Secondary | ICD-10-CM | POA: Diagnosis not present

## 2016-03-03 DIAGNOSIS — I129 Hypertensive chronic kidney disease with stage 1 through stage 4 chronic kidney disease, or unspecified chronic kidney disease: Secondary | ICD-10-CM | POA: Insufficient documentation

## 2016-03-03 LAB — URINALYSIS, ROUTINE W REFLEX MICROSCOPIC
GLUCOSE, UA: 100 mg/dL — AB
Hgb urine dipstick: NEGATIVE
Ketones, ur: NEGATIVE mg/dL
LEUKOCYTES UA: NEGATIVE
Nitrite: NEGATIVE
PH: 5.5 (ref 5.0–8.0)
PROTEIN: NEGATIVE mg/dL
SPECIFIC GRAVITY, URINE: 1.026 (ref 1.005–1.030)

## 2016-03-03 LAB — COMPREHENSIVE METABOLIC PANEL
ALBUMIN: 4 g/dL (ref 3.5–5.0)
ALK PHOS: 113 U/L (ref 38–126)
ALT: 22 U/L (ref 17–63)
ANION GAP: 10 (ref 5–15)
AST: 33 U/L (ref 15–41)
BILIRUBIN TOTAL: 0.4 mg/dL (ref 0.3–1.2)
BUN: 39 mg/dL — AB (ref 6–20)
CO2: 29 mmol/L (ref 22–32)
Calcium: 11.2 mg/dL — ABNORMAL HIGH (ref 8.9–10.3)
Chloride: 93 mmol/L — ABNORMAL LOW (ref 101–111)
Creatinine, Ser: 2.73 mg/dL — ABNORMAL HIGH (ref 0.61–1.24)
GFR calc Af Amer: 25 mL/min — ABNORMAL LOW (ref >60)
GFR calc non Af Amer: 22 mL/min — ABNORMAL LOW (ref >60)
GLUCOSE: 375 mg/dL — AB (ref 65–99)
POTASSIUM: 3.3 mmol/L — AB (ref 3.5–5.1)
SODIUM: 132 mmol/L — AB (ref 135–145)
Total Protein: 6.8 g/dL (ref 6.5–8.1)

## 2016-03-03 LAB — CBC WITH DIFFERENTIAL/PLATELET
BASOS ABS: 0 10*3/uL (ref 0.0–0.1)
Basophils Relative: 0 %
Eosinophils Absolute: 0.2 10*3/uL (ref 0.0–0.7)
Eosinophils Relative: 1 %
HEMATOCRIT: 37.9 % — AB (ref 39.0–52.0)
Hemoglobin: 11.7 g/dL — ABNORMAL LOW (ref 13.0–17.0)
LYMPHS ABS: 0.8 10*3/uL (ref 0.7–4.0)
LYMPHS PCT: 7 %
MCH: 28.5 pg (ref 26.0–34.0)
MCHC: 30.9 g/dL (ref 30.0–36.0)
MCV: 92.4 fL (ref 78.0–100.0)
MONO ABS: 1.1 10*3/uL — AB (ref 0.1–1.0)
Monocytes Relative: 9 %
NEUTROS ABS: 10.4 10*3/uL — AB (ref 1.7–7.7)
Neutrophils Relative %: 83 %
Platelets: 239 10*3/uL (ref 150–400)
RBC: 4.1 MIL/uL — ABNORMAL LOW (ref 4.22–5.81)
RDW: 15.3 % (ref 11.5–15.5)
WBC: 12.5 10*3/uL — ABNORMAL HIGH (ref 4.0–10.5)

## 2016-03-03 LAB — I-STAT TROPONIN, ED: TROPONIN I, POC: 0.01 ng/mL (ref 0.00–0.08)

## 2016-03-03 LAB — I-STAT CG4 LACTIC ACID, ED: Lactic Acid, Venous: 2.44 mmol/L (ref 0.5–1.9)

## 2016-03-03 MED ORDER — SODIUM CHLORIDE 0.9 % IV BOLUS (SEPSIS)
1000.0000 mL | Freq: Once | INTRAVENOUS | Status: AC
  Administered 2016-03-03: 1000 mL via INTRAVENOUS

## 2016-03-03 NOTE — ED Provider Notes (Signed)
[facility] DEPT Provider Note   CSN: [id number] Arrival date & time: 03/03/16  1713     History   Chief Complaint Chief Complaint  Patient presents with  . Fall  . Altered Mental Status    HPI NEIMAR FICCA is a 75 y.o. male.  HPI SASUKE RHYAN is a 75 y.o. male is with history of COPD, diabetes, hypertension, TIAs, presents emergency department with altered mental status. Patient's daughter is providing most of the history. Patient lives alone. Normally is alert and oriented, able to take care of himself and take his own medications. She states she visited him 2 days ago noticed that he has not taken any of his medicines in about 3 days. She has also noticed that he was slightly confused at that time. She states she gave him all his medications and watched him for a little while, and states that he seemed to be improving. She reports visiting him yesterday, and it appeared that patient has fallen on the floor and was able to get back up. Patient with blood in his nose and bruising to the left eye. Patient is completely confused. He is mumbling and talking out of his head. Patient has chronic cough, otherwise no new symptoms. No new medications.  Past Medical History:  Diagnosis Date  . COPD (chronic obstructive pulmonary disease) ([place])   . Diabetes mellitus without complication ([place])   . Hypertension   . Hypothyroidism   . Shortness of breath   . TIA (transient ischemic attack)     Patient Active Problem List   Diagnosis Date Noted  . COPD (chronic obstructive pulmonary disease) ([place]) 02/26/2015  . Acute bronchitis 02/26/2015  . Leukocytosis 02/25/2015  . Acute encephalopathy 02/24/2015  . Swallowing difficulty 01/20/2015  . COPD exacerbation ([place]) 01/18/2015  . Acute renal failure superimposed on stage 3 chronic kidney disease ([place]) 01/16/2015  . Type 2 diabetes mellitus with hyperglycemia ([place]) 01/16/2015  . Essential hypertension 01/16/2015  . Normocytic  anemia   . Hyponatremia 10/06/2013  . GI bleed 10/05/2013  . EMPHYSEMA 10/01/2009  . DYSPNEA ON EXERTION 09/07/2009  . CARCINOMA, BLADDER 09/06/2009  . DIABETES, TYPE 2 09/06/2009  . HYPERLIPIDEMIA 09/06/2009  . HYPERTENSION 09/06/2009    Past Surgical History:  Procedure Laterality Date  . COLONOSCOPY N/A 09/23/2013   Procedure: COLONOSCOPY;  Surgeon: Beryle Beams, MD;  Location: [facility] ENDOSCOPY;  Service: Endoscopy;  Laterality: N/A;  . ESOPHAGOGASTRODUODENOSCOPY N/A 09/23/2013   Procedure: ESOPHAGOGASTRODUODENOSCOPY (EGD);  Surgeon: Beryle Beams, MD;  Location: [facility];  Service: Endoscopy;  Laterality: N/A;  . GIVENS CAPSULE STUDY N/A 10/07/2013   Procedure: GIVENS CAPSULE STUDY;  Surgeon: Beryle Beams, MD;  Location: [facility] ENDOSCOPY;  Service: Endoscopy;  Laterality: N/A;  . surgery for bladder cancer  yrs ago       Home Medications    Prior to Admission medications   Medication Sig Start Date End Date Taking? Authorizing Provider  amLODipine (NORVASC) 5 MG tablet Take 5 mg by mouth every morning.    Historical Provider, MD  aspirin (ASPIRIN CHILDRENS) 81 MG chewable tablet Chew 1 tablet (81 mg total) by mouth daily. 02/27/15   Delfina Redwood, MD  azithromycin (ZITHROMAX) 250 MG tablet Take 1 tablet (250 mg total) by mouth daily. Through 03/02/15, then stop 02/27/15   Delfina Redwood, MD  chlorhexidine (PERIDEX) 0.12 % solution 15 mLs by Mouth Rinse route 2 (two) times daily. 01/25/15   Geradine Girt, DO  Cyanocobalamin (  VITAMIN B-12) 2500 MCG SUBL Place 1 tablet under the tongue daily.    Historical Provider, MD  glipiZIDE (GLUCOTROL) 10 MG tablet Take 10 mg by mouth daily before breakfast.    Historical Provider, MD  guaiFENesin (MUCINEX) 600 MG 12 hr tablet Take 1 tablet (600 mg total) by mouth 2 (two) times daily. For 1 week, then as needed for cough 02/27/15   Delfina Redwood, MD  insulin glargine (LANTUS) 100 UNIT/ML injection Inject 0.1 mLs (10 Units total) into  the skin at bedtime. 01/25/15   Geradine Girt, DO  ipratropium-albuterol (DUONEB) 0.5-2.5 (3) MG/3ML SOLN Take 3 mLs by nebulization 4 (four) times daily. 02/27/15   Delfina Redwood, MD  levothyroxine (SYNTHROID, LEVOTHROID) 125 MCG tablet Take 1 tablet (125 mcg total) by mouth daily before breakfast. 02/27/15   Delfina Redwood, MD  Linaclotide [facility]) 290 MCG CAPS capsule Take 290 mcg by mouth daily.    Historical Provider, MD  lisinopril (PRINIVIL,ZESTRIL) 20 MG tablet Take 20 mg by mouth daily.    Historical Provider, MD  magnesium oxide (MAG-OX) 400 MG tablet Take 400 mg by mouth daily.    Historical Provider, MD  metoprolol succinate (TOPROL-XL) 25 MG 24 hr tablet Take 25 mg by mouth daily.  01/15/15   Historical Provider, MD  sitaGLIPtin (JANUVIA) 100 MG tablet Take 100 mg by mouth daily.    Historical Provider, MD  tamsulosin (FLOMAX) 0.4 MG CAPS capsule Take 0.4 mg by mouth daily at 12 noon. 01/15/15   Historical Provider, MD    Family History History reviewed. No pertinent family history.  Social History Social History  Substance Use Topics  . Smoking status: Never Smoker  . Smokeless tobacco: Current User    Types: Chew  . Alcohol use No     Allergies   Prednisone   Review of Systems Review of Systems  Constitutional: Negative for chills and fever.  Respiratory: Negative for cough, chest tightness and shortness of breath.   Cardiovascular: Negative for chest pain, palpitations and leg swelling.  Gastrointestinal: Negative for diarrhea.  Genitourinary: Negative for dysuria, frequency, hematuria and urgency.  Skin: Negative for rash.  Allergic/Immunologic: Negative for immunocompromised state.  Neurological: Negative for dizziness, weakness, light-headedness, numbness and headaches.  Psychiatric/Behavioral: Positive for confusion.  All other systems reviewed and are negative.    Physical Exam Updated Vital Signs BP (!) 101/52   Pulse 84   Temp 98.2 F (36.8  C)   Resp 20   Ht 6\' 1"  (1.854 m)   Wt 108.9 kg   SpO2 99%   BMI 31.66 kg/m   Physical Exam  Constitutional: He appears well-developed and well-nourished. No distress.  HENT:  Head: Normocephalic and atraumatic.  Eyes: Conjunctivae are normal. Pupils are equal, round, and reactive to light.  Patient will not look to the right, not certain if because unable to follow all directions or right-sided neglect  Neck: Normal range of motion. Neck supple.  Cardiovascular: Normal rate, regular rhythm and normal heart sounds.   Pulmonary/Chest: Effort normal. No respiratory distress. He has no wheezes. He has no rales.  Abdominal: Soft. Bowel sounds are normal. He exhibits no distension. There is no tenderness. There is no rebound.  Musculoskeletal: He exhibits no edema.  Neurological: He is alert. No cranial nerve deficit. Coordination abnormal.  Oriented to place and self, disoriented to time. Only following certain commands. Moving all extremities. Grip is 5 out of 5 and equal bilaterally. No pronator drift. Lower  extremity strength is 5 out of 5 and equal.  Skin: Skin is warm and dry.  Nursing note and vitals reviewed.    ED Treatments / Results  Labs (all labs ordered are listed, but only abnormal results are displayed) Labs Reviewed  COMPREHENSIVE METABOLIC PANEL - Abnormal; Notable for the following:       Result Value   Sodium 132 (*)    Potassium 3.3 (*)    Chloride 93 (*)    Glucose, Bld 375 (*)    BUN 39 (*)    Creatinine, Ser 2.73 (*)    Calcium 11.2 (*)    GFR calc non Af Amer 22 (*)    GFR calc Af Amer 25 (*)    All other components within normal limits  URINALYSIS, ROUTINE W REFLEX MICROSCOPIC (NOT AT [facility]) - Abnormal; Notable for the following:    Glucose, UA 100 (*)    Bilirubin Urine SMALL (*)    All other components within normal limits  CBC WITH DIFFERENTIAL/PLATELET - Abnormal; Notable for the following:    WBC 12.5 (*)    RBC 4.10 (*)    Hemoglobin 11.7  (*)    HCT 37.9 (*)    Neutro Abs 10.4 (*)    Monocytes Absolute 1.1 (*)    All other components within normal limits  CULTURE, BLOOD (ROUTINE X 2)  CULTURE, BLOOD (ROUTINE X 2)  URINE CULTURE  I-STAT CG4 LACTIC ACID, ED  I-STAT TROPOININ, ED    EKG  EKG Interpretation None       Radiology Dg Chest 2 View  Result Date: 03/03/2016 CLINICAL DATA:  Initial evaluation for acute cough. Diminished breath sounds bilaterally. EXAM: CHEST  2 VIEW COMPARISON:  Prior radiograph from 10/01/2015. FINDINGS: Cardiac and mediastinal silhouettes are stable in size and contour, and remain within normal limits. Aortic atherosclerosis noted. Lung volumes are mildly increased bilaterally with attenuation of the pulmonary markings, likely related underlying emphysema. No focal infiltrate, pulmonary edema, or pleural effusion. Mild scarring noted within the lingula, similar to prior. No pneumothorax. No acute osseous abnormality. IMPRESSION: 1. No active cardiopulmonary disease. 2. Emphysema. 3. Aortic atherosclerosis. Electronically Signed   By: Jeannine Boga M.D.   On: 03/03/2016 17:54   Ct Head Wo Contrast  Result Date: 03/03/2016 CLINICAL DATA:  Unwitnessed fall to concrete. EXAM: CT HEAD WITHOUT CONTRAST CT CERVICAL SPINE WITHOUT CONTRAST TECHNIQUE: Multidetector CT imaging of the head and cervical spine was performed following the standard protocol without intravenous contrast. Multiplanar CT image reconstructions of the cervical spine were also generated. COMPARISON:  02/24/2015 FINDINGS: CT HEAD FINDINGS There is no intracranial hemorrhage, mass or evidence of acute infarction. There is mild generalized atrophy. There is mild chronic microvascular ischemic change. There is no significant extra-axial fluid collection.No acute intracranial findings are evident. The calvarium and skullbase are intact. Visible paranasal sinuses and orbits are unremarkable. CT CERVICAL SPINE FINDINGS There is anterior  interbody fusion with anterior plate screw fixation hardware at C3-4. The interbody fusion is solidly ossified. The vertebral column, pedicles and facet articulations are intact. There is no evidence of acute fracture. No acute soft tissue abnormalities are evident. Moderate cervical facet arthritis is present, particularly from C3 through C6. No bone lesion or bony destruction. IMPRESSION: 1. No acute intracranial findings. There is moderate generalized atrophy and chronic appearing white matter hypodensities which likely represent small vessel ischemic disease. 2. Negative for acute cervical spine fracture. Solidly ossified C3-4 interbody fusion with anterior fixation hardware. Electronically  Signed   By: Andreas [facility] M.D.   On: 03/03/2016 22:47   Ct Cervical Spine Wo Contrast  Result Date: 03/03/2016 CLINICAL DATA:  Unwitnessed fall to concrete. EXAM: CT HEAD WITHOUT CONTRAST CT CERVICAL SPINE WITHOUT CONTRAST TECHNIQUE: Multidetector CT imaging of the head and cervical spine was performed following the standard protocol without intravenous contrast. Multiplanar CT image reconstructions of the cervical spine were also generated. COMPARISON:  02/24/2015 FINDINGS: CT HEAD FINDINGS There is no intracranial hemorrhage, mass or evidence of acute infarction. There is mild generalized atrophy. There is mild chronic microvascular ischemic change. There is no significant extra-axial fluid collection.No acute intracranial findings are evident. The calvarium and skullbase are intact. Visible paranasal sinuses and orbits are unremarkable. CT CERVICAL SPINE FINDINGS There is anterior interbody fusion with anterior plate screw fixation hardware at C3-4. The interbody fusion is solidly ossified. The vertebral column, pedicles and facet articulations are intact. There is no evidence of acute fracture. No acute soft tissue abnormalities are evident. Moderate cervical facet arthritis is present, particularly from C3  through C6. No bone lesion or bony destruction. IMPRESSION: 1. No acute intracranial findings. There is moderate generalized atrophy and chronic appearing white matter hypodensities which likely represent small vessel ischemic disease. 2. Negative for acute cervical spine fracture. Solidly ossified C3-4 interbody fusion with anterior fixation hardware. Electronically Signed   By: Andreas [facility] M.D.   On: 03/03/2016 22:47    Procedures Procedures (including critical care time)  Medications Ordered in ED Medications  sodium chloride 0.9 % bolus 1,000 mL (not administered)     Initial Impression / Assessment and Plan / ED Course  I have reviewed the triage vital signs and the nursing notes.  Pertinent labs & imaging results that were available during my care of the patient were reviewed by me and considered in my medical decision making (see chart for details).  Clinical Course  Comment By Time  Patient seen and examined. Patient with altered mental status worsened since 2 days ago. No specific complaints. Patient apparently had a fall today, has a small contusion over his nose and left eyebrow. Labs and CT scan of the head, cervical spine, chest x-ray pending. Jeannett Senior, PA-C 08/28 2257    CTs unremarkable. Labs show elevated BUN and creatinine, question dehydration. Mildly elevated lactic acid at 2.44. Patient is afebrile, rectal temp is 99.7. Blood pressure, heart rate, respiratory rate all within normal. Oxygen saturation is normal. Doubt infectious process as the cause of patient's confusion. Question CVA versus severe dehydration versus medication reaction. Discussed with [facility] hospitalist, will admit.  Final Clinical Impressions(s) / ED Diagnoses   Final diagnoses:  Altered mental status, unspecified altered mental status type  Facial contusion, initial encounter  Fall, initial encounter  Acute renal failure, unspecified acute renal failure type [facility])    New  Prescriptions New Prescriptions   No medications on file     Jeannett Senior, PA-C 03/04/16 [id number]

## 2016-03-03 NOTE — ED Triage Notes (Signed)
Per Family: Pt fell today, struck head, unknown LOC. Pt states fell yesterday as well. New onset confusion. Unsure if stared before or after fall. Pt a/o x 2. Pt with diminished breath sounds bilaterally. Congested cough heard at triage.

## 2016-03-04 ENCOUNTER — Observation Stay (HOSPITAL_COMMUNITY): Payer: Medicare Other

## 2016-03-04 ENCOUNTER — Encounter (HOSPITAL_COMMUNITY): Payer: Self-pay | Admitting: Internal Medicine

## 2016-03-04 DIAGNOSIS — R4182 Altered mental status, unspecified: Secondary | ICD-10-CM

## 2016-03-04 DIAGNOSIS — N179 Acute kidney failure, unspecified: Secondary | ICD-10-CM

## 2016-03-04 DIAGNOSIS — G9340 Encephalopathy, unspecified: Secondary | ICD-10-CM | POA: Diagnosis not present

## 2016-03-04 LAB — I-STAT ARTERIAL BLOOD GAS, ED
Acid-Base Excess: 5 mmol/L — ABNORMAL HIGH (ref 0.0–2.0)
BICARBONATE: 30 mmol/L — AB (ref 20.0–28.0)
O2 Saturation: 93 %
PCO2 ART: 46.6 mmHg (ref 32.0–48.0)
TCO2: 31 mmol/L (ref 0–100)
pH, Arterial: 7.42 (ref 7.350–7.450)
pO2, Arterial: 69 mmHg — ABNORMAL LOW (ref 83.0–108.0)

## 2016-03-04 LAB — GLUCOSE, CAPILLARY
GLUCOSE-CAPILLARY: 216 mg/dL — AB (ref 65–99)
GLUCOSE-CAPILLARY: 282 mg/dL — AB (ref 65–99)
Glucose-Capillary: 277 mg/dL — ABNORMAL HIGH (ref 65–99)
Glucose-Capillary: 230 mg/dL — ABNORMAL HIGH (ref 65–99)
Glucose-Capillary: 169 mg/dL — ABNORMAL HIGH (ref 65–99)

## 2016-03-04 LAB — COMPREHENSIVE METABOLIC PANEL
ALT: 20 U/L (ref 17–63)
ANION GAP: 9 (ref 5–15)
AST: 27 U/L (ref 15–41)
Albumin: 3.5 g/dL (ref 3.5–5.0)
Alkaline Phosphatase: 101 U/L (ref 38–126)
BILIRUBIN TOTAL: 0.6 mg/dL (ref 0.3–1.2)
BUN: 36 mg/dL — AB (ref 6–20)
CALCIUM: 10.6 mg/dL — AB (ref 8.9–10.3)
CHLORIDE: 94 mmol/L — AB (ref 101–111)
CO2: 31 mmol/L (ref 22–32)
Creatinine, Ser: 2.36 mg/dL — ABNORMAL HIGH (ref 0.61–1.24)
GFR, EST AFRICAN AMERICAN: 30 mL/min — AB (ref >60)
GFR, EST NON AFRICAN AMERICAN: 26 mL/min — AB (ref >60)
GLUCOSE: 258 mg/dL — AB (ref 65–99)
POTASSIUM: 2.9 mmol/L — AB (ref 3.5–5.1)
Sodium: 134 mmol/L — ABNORMAL LOW (ref 135–145)
TOTAL PROTEIN: 6.3 g/dL — AB (ref 6.5–8.1)

## 2016-03-04 LAB — CBC
HEMATOCRIT: 35.4 % — AB (ref 39.0–52.0)
Hemoglobin: 10.8 g/dL — ABNORMAL LOW (ref 13.0–17.0)
MCH: 27.8 pg (ref 26.0–34.0)
MCHC: 30.5 g/dL (ref 30.0–36.0)
MCV: 91.2 fL (ref 78.0–100.0)
Platelets: 188 10*3/uL (ref 150–400)
RBC: 3.88 MIL/uL — ABNORMAL LOW (ref 4.22–5.81)
RDW: 15.6 % — AB (ref 11.5–15.5)
WBC: 8.7 10*3/uL (ref 4.0–10.5)

## 2016-03-04 LAB — RAPID URINE DRUG SCREEN, HOSP PERFORMED
Amphetamines: NOT DETECTED
BARBITURATES: NOT DETECTED
BENZODIAZEPINES: NOT DETECTED
COCAINE: NOT DETECTED
OPIATES: NOT DETECTED
TETRAHYDROCANNABINOL: NOT DETECTED

## 2016-03-04 LAB — CK: Total CK: 390 U/L (ref 49–397)

## 2016-03-04 LAB — RPR: RPR: NONREACTIVE

## 2016-03-04 LAB — LACTIC ACID, PLASMA: Lactic Acid, Venous: 1.4 mmol/L (ref 0.5–1.9)

## 2016-03-04 LAB — AMMONIA: AMMONIA: 12 umol/L (ref 9–35)

## 2016-03-04 LAB — TSH: TSH: 1.68 u[IU]/mL (ref 0.350–4.500)

## 2016-03-04 LAB — VITAMIN B12: VITAMIN B 12: 833 pg/mL (ref 180–914)

## 2016-03-04 MED ORDER — LEVOTHYROXINE SODIUM 25 MCG PO TABS
125.0000 ug | ORAL_TABLET | Freq: Every day | ORAL | Status: DC
  Administered 2016-03-04 – 2016-03-05 (×2): 125 ug via ORAL
  Filled 2016-03-04 (×2): qty 1

## 2016-03-04 MED ORDER — TAMSULOSIN HCL 0.4 MG PO CAPS
0.4000 mg | ORAL_CAPSULE | Freq: Every day | ORAL | Status: DC
  Administered 2016-03-04 – 2016-03-05 (×2): 0.4 mg via ORAL
  Filled 2016-03-04 (×2): qty 1

## 2016-03-04 MED ORDER — ADULT MULTIVITAMIN W/MINERALS CH
1.0000 | ORAL_TABLET | Freq: Every day | ORAL | Status: DC
  Administered 2016-03-04 – 2016-03-05 (×2): 1 via ORAL
  Filled 2016-03-04 (×2): qty 1

## 2016-03-04 MED ORDER — AMLODIPINE BESYLATE 5 MG PO TABS
5.0000 mg | ORAL_TABLET | Freq: Every morning | ORAL | Status: DC
  Filled 2016-03-04: qty 1

## 2016-03-04 MED ORDER — HYDRALAZINE HCL 20 MG/ML IJ SOLN: 10.0000 mg | INTRAMUSCULAR | Status: DC | PRN

## 2016-03-04 MED ORDER — POTASSIUM CHLORIDE CRYS ER 20 MEQ PO TBCR
30.0000 meq | EXTENDED_RELEASE_TABLET | Freq: Two times a day (BID) | ORAL | Status: AC
  Administered 2016-03-04 (×2): 30 meq via ORAL
  Filled 2016-03-04 (×2): qty 1

## 2016-03-04 MED ORDER — ASPIRIN 81 MG PO CHEW
81.0000 mg | CHEWABLE_TABLET | Freq: Every day | ORAL | Status: DC
  Administered 2016-03-04 – 2016-03-05 (×2): 81 mg via ORAL
  Filled 2016-03-04 (×2): qty 1

## 2016-03-04 MED ORDER — MAGNESIUM OXIDE 400 (241.3 MG) MG PO TABS
400.0000 mg | ORAL_TABLET | Freq: Every day | ORAL | Status: DC
  Administered 2016-03-04: 400 mg via ORAL
  Filled 2016-03-04: qty 1

## 2016-03-04 MED ORDER — GLIPIZIDE 5 MG PO TABS
10.0000 mg | ORAL_TABLET | Freq: Every day | ORAL | Status: DC
  Administered 2016-03-04 – 2016-03-05 (×2): 10 mg via ORAL
  Filled 2016-03-04 (×2): qty 2

## 2016-03-04 MED ORDER — SODIUM CHLORIDE 0.9 % IV SOLN
INTRAVENOUS | Status: AC
  Administered 2016-03-04: 22:00:00 via INTRAVENOUS

## 2016-03-04 MED ORDER — METOPROLOL SUCCINATE ER 25 MG PO TB24
25.0000 mg | ORAL_TABLET | Freq: Every day | ORAL | Status: DC
  Administered 2016-03-04 – 2016-03-05 (×2): 25 mg via ORAL
  Filled 2016-03-04 (×2): qty 1

## 2016-03-04 MED ORDER — SODIUM CHLORIDE 0.9 % IV SOLN
INTRAVENOUS | Status: AC
  Administered 2016-03-04: 03:00:00 via INTRAVENOUS

## 2016-03-04 MED ORDER — INSULIN ASPART 100 UNIT/ML ~~LOC~~ SOLN
0.0000 [IU] | Freq: Three times a day (TID) | SUBCUTANEOUS | Status: DC
  Administered 2016-03-04: 3 [IU] via SUBCUTANEOUS
  Administered 2016-03-04: 5 [IU] via SUBCUTANEOUS
  Administered 2016-03-05 (×2): 2 [IU] via SUBCUTANEOUS

## 2016-03-04 NOTE — ED Provider Notes (Addendum)
Pt is a 75 y.o. male who presents with  Chief Complaint  Patient presents with  . Fall  . Altered Mental Status    Physical Exam  Constitutional: No distress.  elderly  HENT:  Head: Normocephalic and atraumatic.  Eyes: Conjunctivae are normal. Left eye exhibits no discharge. No scleral icterus.  Neck: No tracheal deviation present. No thyromegaly present.  Pulmonary/Chest: Effort normal. No stridor. He has rhonchi.  Abdominal: He exhibits no distension.  Musculoskeletal: He exhibits deformity. He exhibits no tenderness.  Neurological: He is alert. He is agitated and disoriented. He displays weakness. Cranial nerve deficit: no facial droop.  Skin: Skin is warm. No rash noted. He is not diaphoretic. No erythema.  Psychiatric: Affect normal.    Clinical Course  Comment By Time  Patient seen and examined. Patient with altered mental status worsened since 2 days ago. No specific complaints. Patient apparently had a fall today, has a small contusion over his nose and left eyebrow. Labs and CT scan of the head, cervical spine, chest x-ray pending. Jeannett Senior, PA-C 08/28 2257     EKG Interpretation  Date/Time:  Monday March 03 2016 17:21:30 EDT Ventricular Rate:  92 PR Interval:  182 QRS Duration: 96 QT Interval:  376 QTC Calculation: 464 R Axis:   48 Text Interpretation:  Normal sinus rhythm Incomplete right bundle branch block Nonspecific ST abnormality Abnormal ECG No significant change since last tracing Confirmed by Jamieon Lannen  MD-J, Nakeda Lebron ([id number]) on 03/04/2016 9:36:14 AM      Pt presented after a fall with confusion.  Labs show acute kidney injury.  CT scan without acute finding.  Cannot exclude CVA.   Will admit for further evaluation.   Medical screening examination/treatment/procedure(s) were conducted as a shared visit with non-physician practitioner(s) and myself.  I personally evaluated the patient during the encounter.     Dorie Rank, MD 03/04/16 [id number]

## 2016-03-04 NOTE — Evaluation (Signed)
Physical Therapy Evaluation Patient Details Name: Harold Martin MRN: [id number] DOB: 1940/07/21 Today's Date: 03/04/2016   History of Present Illness  75 y.o. male admitted for AMS and a mechanical fall at home. PMH significant for HTN, HLD, COPD, dyspnea on exertion, DM type II, CKD stage 3, bladder carcinoma, and swallowing difficulty.   Clinical Impression  Pt admitted with/for AMS and fall.  Pt currently limited functionally due to the problems listed. ( See problems list.)   Pt will benefit from PT to maximize function and safety in order to get ready for next venue listed below.     Follow Up Recommendations SNF;Supervision/Assistance - 24 hour    Equipment Recommendations  None recommended by PT    Recommendations for Other Services       Precautions / Restrictions Precautions Precautions: Fall Restrictions Weight Bearing Restrictions: No      Mobility  Bed Mobility Overal bed mobility: Needs Assistance Bed Mobility: Supine to Sit     Supine to sit: Min guard     General bed mobility comments: pt used momentum to get up without hands  Transfers Overall transfer level: Needs assistance Equipment used: 2 person hand held assist Transfers: Sit to/from [facility] Sit to Stand: Min guard Stand pivot transfers: Min assist       General transfer comment: steady assist for stand and guiding for transfer.  Ambulation/Gait             General Gait Details: not test due to would have liked 2 person assist due to mentation.  Stairs            Wheelchair Mobility    Modified Rankin (Stroke Patients Only)       Balance Overall balance assessment: Needs assistance Sitting-balance support: No upper extremity supported;Feet supported Sitting balance-Leahy Scale: Good     Standing balance support: Bilateral upper extremity supported;During functional activity Standing balance-Leahy Scale: Poor Standing balance comment: needed  steady assist                             Pertinent Vitals/Pain Pain Assessment: Faces Faces Pain Scale: Hurts little more Pain Location: L hip with MMT Pain Descriptors / Indicators: Aching;Sore;Grimacing    Home Living Family/patient expects to be discharged to:: Private residence Living Arrangements: Alone Available Help at Discharge: Family;Available PRN/intermittently Type of Home: Mobile home Home Access: Stairs to enter     Home Layout: One level Home Equipment: Environmental consultant - 2 wheels;Bedside commode Additional Comments: Information provided by pt's granddaughter who is a NT on the unit. She is unsure if pt has a BSC or not.    Prior Function Level of Independence: Needs assistance   Gait / Transfers Assistance Needed: Uses RW at home  ADL's / Homemaking Assistance Needed: Granddaughter reports that pt is typically independent with all ADLs, makes own meals using microwave primarily, and drives to appointments and errands        Hand Dominance        Extremity/Trunk Assessment   Upper Extremity Assessment: Generalized weakness           Lower Extremity Assessment: Difficult to assess due to impaired cognition;Generalized weakness         Communication   Communication: No difficulties;Other (comment) (but tangential of thought)  Cognition Arousal/Alertness: Awake/alert Behavior During Therapy: Flat affect Overall Cognitive Status: Impaired/Different from baseline Area of Impairment: Attention;Orientation;Memory;Following commands;Safety/judgement;Awareness;Problem solving Orientation Level: Disoriented to;Place;Time;Situation Current Attention  Level: Focused Memory: Decreased short-term memory Following Commands: Follows one step commands inconsistently Safety/Judgement: Decreased awareness of deficits;Decreased awareness of safety Awareness: Intellectual Problem Solving: Slow processing;Decreased initiation;Requires tactile cues;Requires  verbal cues;Difficulty sequencing General Comments: In repsonse to orientation questions pt answer "[facility] and February." Pt kept eyes closed most of session despite multimodal cues to open them. Pt would repsond inappropriately to therapist's questions and would often repeat words therapist had used.     General Comments      Exercises        Assessment/Plan    PT Assessment Patient needs continued PT services  PT Diagnosis Generalized weakness;Altered mental status   PT Problem List Decreased strength;Decreased activity tolerance;Decreased balance;Decreased mobility;Decreased safety awareness  PT Treatment Interventions Gait training;DME instruction;Stair training;Functional mobility training;Therapeutic activities;Balance training;Patient/family education   PT Goals (Current goals can be found in the Care Plan section) Acute Rehab PT Goals Patient Stated Goal: Pt's granddaughter would like for pt to get better and receive more home health aide assistance PT Goal Formulation: Patient unable to participate in goal setting Time For Goal Achievement: 03/18/16 Potential to Achieve Goals: Good    Frequency Min 3X/week   Barriers to discharge Decreased caregiver support      Co-evaluation               End of Session   Activity Tolerance: Patient tolerated treatment well Patient left: in chair;with chair alarm set;with call bell/phone within reach Nurse Communication: Mobility status    Functional Assessment Tool Used: clinical judgement Functional Limitation: Mobility: Walking and moving around Mobility: Walking and Moving Around Current Status [id number]): At least 20 percent but less than 40 percent impaired, limited or restricted Mobility: Walking and Moving Around Goal Status [phone]): At least 1 percent but less than 20 percent impaired, limited or restricted    Time: 1738-1800 PT Time Calculation (min) (ACUTE ONLY): 22 min   Charges:   PT  Evaluation $PT Eval Moderate Complexity: 1 Procedure     PT G Codes:   PT G-Codes **NOT FOR INPATIENT CLASS** Functional Assessment Tool Used: clinical judgement Functional Limitation: Mobility: Walking and moving around Mobility: Walking and Moving Around Current Status [id number]): At least 20 percent but less than 40 percent impaired, limited or restricted Mobility: Walking and Moving Around Goal Status [phone]): At least 1 percent but less than 20 percent impaired, limited or restricted    Shelene Krage, Tessie Fass 03/04/2016, 6:06 PM 03/04/2016  Donnella Sham, PT [phone]  (pager)

## 2016-03-04 NOTE — Procedures (Signed)
ELECTROENCEPHALOGRAM REPORT  Date of Study: 03/04/2016  Patient's Name: Harold Martin MRN: [id number] Date of Birth: 05/31/1941  Referring Provider: Dr. Gean Birchwood  Clinical History: This is a 75 year old man with altered mental status.  Medications: amLODipine (NORVASC) tablet 5 mg  aspirin chewable tablet 81 mg  glipiZIDE (GLUCOTROL) tablet 10 mg  hydrALAZINE (APRESOLINE) injection 10 mg  insulin aspart (novoLOG) injection 0-9 Units  levothyroxine (SYNTHROID, LEVOTHROID) tablet 125 mcg  magnesium oxide (MAG-OX) tablet 400 mg  metoprolol succinate (TOPROL-XL) 24 hr tablet 25 mg  multivitamin with minerals tablet 1 tablet  potassium chloride (K-DUR,KLOR-CON) CR tablet 30 mEq  tamsulosin (FLOMAX) capsule 0.4 mg   Technical Summary: A multichannel digital EEG recording measured by the international 10-20 system with electrodes applied with paste and impedances below 5000 ohms performed as portable with EKG monitoring in an awake and drowsy patient.  Hyperventilation was not performed. Photic stimulation was performed.  The digital EEG was referentially recorded, reformatted, and digitally filtered in a variety of bipolar and referential montages for optimal display.   Description: The patient is awake and drowsy during the recording. There is no clear posterior dominant rhythm. The background consists of a large amount of diffuse 4-5 Hz theta and 2-3 Hz delta slowing. Normal sleep architecture is not seen. Photic stimulation did not elicit any abnormalities.  There were no epileptiform discharges or electrographic seizures seen.    EKG lead was unremarkable.  Impression: This awake and drowsy EEG is abnormal due to moderate diffuse slowing of the waking background.  Clinical Correlation of the above findings indicates diffuse cerebral dysfunction that is non-specific in etiology and can be seen with hypoxic/ischemic injury, toxic/metabolic encephalopathies,  neurodegenerative disorders, or medication effect.  The absence of epileptiform discharges does not rule out a clinical diagnosis of epilepsy.  Clinical correlation is advised.   Ellouise Newer, M.D.

## 2016-03-04 NOTE — Progress Notes (Signed)
OT Cancellation Note  Patient Details Name: Harold Martin MRN: [id number] DOB: [birth year]   Cancelled Treatment:    Reason Eval/Treat Not Completed: Patient at procedure or test/ unavailable (EEG)  Redmond Baseman, OTR/L Pager: [phone] 03/04/2016, 9:28 AM

## 2016-03-04 NOTE — NC FL2 (Signed)
[place] LEVEL OF CARE SCREENING TOOL     IDENTIFICATION  Patient Name: Harold Martin Birthdate: Apr 07, 1941 Sex: male Admission Date (Current Location): 03/03/2016  [facility] and [place] Number:  Herbalist and Address:  [facility] [place], [place] [address], [place], [place] [address]      Provider Number: [id number]  Attending Physician Name and Address:  Patrecia Pour, MD  Relative Name and Phone Number:       Current Level of Care: Hospital Recommended Level of Care: [place] Prior Approval Number:    Date Approved/Denied:   PASRR Number: [id number]  Discharge Plan: SNF    Current Diagnoses: Patient Active Problem List   Diagnosis Date Noted  . Altered mental state 03/04/2016  . Acute renal failure ([place])   . COPD (chronic obstructive pulmonary disease) ([place]) 02/26/2015  . Acute bronchitis 02/26/2015  . Leukocytosis 02/25/2015  . Acute encephalopathy 02/24/2015  . Swallowing difficulty 01/20/2015  . COPD exacerbation ([place]) 01/18/2015  . Acute renal failure superimposed on stage 3 chronic kidney disease ([place]) 01/16/2015  . Type 2 diabetes mellitus with hyperglycemia ([place]) 01/16/2015  . Essential hypertension 01/16/2015  . Normocytic anemia   . Hyponatremia 10/06/2013  . GI bleed 10/05/2013  . EMPHYSEMA 10/01/2009  . DYSPNEA ON EXERTION 09/07/2009  . CARCINOMA, BLADDER 09/06/2009  . DIABETES, TYPE 2 09/06/2009  . HYPERLIPIDEMIA 09/06/2009  . HYPERTENSION 09/06/2009    Orientation RESPIRATION BLADDER Height & Weight     Self, Place  Normal Continent Weight: 109.5 kg (241 lb 6.5 oz) Height:  6\' 1"  (185.4 cm)  BEHAVIORAL SYMPTOMS/MOOD NEUROLOGICAL BOWEL NUTRITION STATUS      Continent Diet (see DC summary)  AMBULATORY STATUS COMMUNICATION OF NEEDS Skin   Extensive Assist Verbally Normal                       Personal Care Assistance Level of Assistance  Bathing, Dressing Bathing Assistance:  Maximum assistance   Dressing Assistance: Maximum assistance     Functional Limitations Info             SPECIAL CARE FACTORS FREQUENCY  PT (By licensed PT), OT (By licensed OT)     PT Frequency: 5/wk OT Frequency: 5/wk            Contractures      Additional Factors Info  Code Status, Allergies, Insulin Sliding Scale Code Status Info: DNR Allergies Info: Prednisone   Insulin Sliding Scale Info: 3/day       Current Medications (03/04/2016):  This is the current hospital active medication list Current Facility-Administered Medications  Medication Dose Route Frequency Provider Last Rate Last Dose  . 0.9 %  sodium chloride infusion   Intravenous STAT Tatyana Kirichenko, PA-C 125 mL/hr at 03/04/16 0237    . 0.9 %  sodium chloride infusion   Intravenous Continuous Rise Patience, MD      . amLODipine (NORVASC) tablet 5 mg  5 mg Oral q morning - 10a Rise Patience, MD      . aspirin chewable tablet 81 mg  81 mg Oral Daily Rise Patience, MD   81 mg at 03/04/16 1138  . glipiZIDE (GLUCOTROL) tablet 10 mg  10 mg Oral QAC breakfast Rise Patience, MD   10 mg at 03/04/16 1137  . hydrALAZINE (APRESOLINE) injection 10 mg  10 mg Intravenous Q4H PRN Rise Patience, MD      .  insulin aspart (novoLOG) injection 0-9 Units  0-9 Units Subcutaneous TID WC Patrecia Pour, MD   5 Units at 03/04/16 1307  . levothyroxine (SYNTHROID, LEVOTHROID) tablet 125 mcg  125 mcg Oral QAC breakfast Rise Patience, MD   125 mcg at 03/04/16 1136  . magnesium oxide (MAG-OX) tablet 400 mg  400 mg Oral QHS Rise Patience, MD      . metoprolol succinate (TOPROL-XL) 24 hr tablet 25 mg  25 mg Oral Daily Rise Patience, MD   25 mg at 03/04/16 1136  . multivitamin with minerals tablet 1 tablet  1 tablet Oral Daily Rise Patience, MD   1 tablet at 03/04/16 1136  . potassium chloride (K-DUR,KLOR-CON) CR tablet 30 mEq  30 mEq Oral BID Patrecia Pour, MD   30 mEq at 03/04/16 1137   . tamsulosin (FLOMAX) capsule 0.4 mg  0.4 mg Oral Q1200 Rise Patience, MD   0.4 mg at 03/04/16 1139     Discharge Medications: Please see discharge summary for a list of discharge medications.  Relevant Imaging Results:  Relevant Lab Results:   Additional Information SS#: [id number]  Jorge Ny, LCSW

## 2016-03-04 NOTE — Care Management Note (Signed)
Case Management Note  Patient Details  Name: Harold Martin MRN: [id number] Date of Birth: [birth year]  Subjective/Objective:                 Spoke with patient's daughter at the bedside. She states that patient lives at home alone, has a walker. Has had a hospitalization about a year ago for confusion and weakness, but did not fall at that time. She is in agreement with PT rec for SNF at DC. She states that he family would prefer patient to go to [facility] as patient's wife is a current patient there. CM updated [place] CSW on consult.   Action/Plan:  Anticipate DC to SNF in next day or so as pt's encephalopathy improves.   Expected Discharge Date:                  Expected Discharge Plan:  Skilled Nursing Facility  In-House Referral:  Clinical Social Work  Discharge planning Services  CM Consult  Post Acute Care Choice:  NA Choice offered to:  NA  DME Arranged:  N/A DME Agency:  NA  HH Arranged:  NA HH Agency:  NA  Status of Service:  Completed, signed off  If discussed at [place] of Stay Meetings, dates discussed:    Additional Comments:  Carles Collet, RN 03/04/2016, 2:22 PM

## 2016-03-04 NOTE — Progress Notes (Signed)
Inpatient Diabetes Program Recommendations  AACE/ADA: New Consensus Statement on Inpatient Glycemic Control (2015)  Target Ranges:  Prepandial:   less than 140 mg/dL      Peak postprandial:   less than 180 mg/dL (1-2 hours)      Critically ill patients:  140 - 180 mg/dL   Lab Results  Component Value Date   GLUCAP 282 (H) 03/04/2016   HGBA1C 10.5 (H) 02/25/2015    Review of Glycemic Control:  Results for HERO, AINSLEY (MRN [id number]) as of 03/04/2016 15:08  Ref. Range 03/04/2016 02:15 03/04/2016 07:43 03/04/2016 12:27  Glucose-Capillary Latest Ref Range: 65 - 99 mg/dL 277 (H) 216 (H) 282 (H)   Diabetes history: Type 2 diabetes Outpatient Diabetes medications: NPH 30 units daily, Glipizide 10 mg daily Current orders for Inpatient glycemic control:  Novolog sensitive tid with meals, Glipizide 10 mg daily with breakfast  Inpatient Diabetes Program Recommendations:    Please restart a portion of NPH. Consider NPH 20 units q AM.    Thanks, Adah Perl, RN, BC-ADM Inpatient Diabetes Coordinator Pager [phone] (8a-5p)

## 2016-03-04 NOTE — H&P (Signed)
History and Physical    Harold Martin [id number] DOB: Jun 27, 1941 DOA: 03/03/2016  PCP: Dwan Bolt, MD  Patient coming from: Home.  History obtained from patient's daughter and ER physician. Patient appears confused.  Chief Complaint: Confusion.  HPI: Harold Martin is a 75 y.o. male with diabetes mellitus type 2, chronic anemia, chronic kidney disease stage III, hypertension, hypothyroidism was brought to the ER after patient was found to be confused and had a nasal bleed. As per patient's daughter patient lives alone and was found to be mildly confused 3 days ago. Yesterday when patient's daughter went to check on the patient patient was sitting on a chair with some nasal bleed suspect from a fall. Patient had similar confusion episodes last year course of which was not clear. In the ER at this time CT head and neck was unremarkable. There is no definite infectious cause seen. On my exam patient is alert awake and oriented to his name and follows commands. Complains of some left hip and knee pain. Patient is being admitted for further management. As per the patient's daughter patient has not had any recent change in his medications. Does not drink alcohol. ABG done in the ER does not show any carbon dioxide retention.   ED Course: Patient's lactate levels were elevated for which patient was given 1 L fluid bolus.  Review of Systems: As per HPI, rest all negative.   Past Medical History:  Diagnosis Date  . COPD (chronic obstructive pulmonary disease) ([place])   . Diabetes mellitus without complication ([place])   . Hypertension   . Hypothyroidism   . Shortness of breath   . TIA (transient ischemic attack)     Past Surgical History:  Procedure Laterality Date  . COLONOSCOPY N/A 09/23/2013   Procedure: COLONOSCOPY;  Surgeon: Beryle Beams, MD;  Location: [facility] ENDOSCOPY;  Service: Endoscopy;  Laterality: N/A;  . ESOPHAGOGASTRODUODENOSCOPY N/A 09/23/2013   Procedure:  ESOPHAGOGASTRODUODENOSCOPY (EGD);  Surgeon: Beryle Beams, MD;  Location: [facility];  Service: Endoscopy;  Laterality: N/A;  . GIVENS CAPSULE STUDY N/A 10/07/2013   Procedure: GIVENS CAPSULE STUDY;  Surgeon: Beryle Beams, MD;  Location: [facility] ENDOSCOPY;  Service: Endoscopy;  Laterality: N/A;  . surgery for bladder cancer  yrs ago     reports that he has never smoked. His smokeless tobacco use includes Chew. He reports that he does not drink alcohol or use drugs.  Allergies  Allergen Reactions  . Prednisone     REACTION: Reaction not known    Family History  Problem Relation Age of Onset  . Hypertension Other     Prior to Admission medications   Medication Sig Start Date End Date Taking? Authorizing Provider  amLODipine (NORVASC) 5 MG tablet Take 5 mg by mouth every morning.   Yes Historical Provider, MD  aspirin (ASPIRIN CHILDRENS) 81 MG chewable tablet Chew 1 tablet (81 mg total) by mouth daily. 02/27/15  Yes Delfina Redwood, MD  glipiZIDE (GLUCOTROL) 10 MG tablet Take 10 mg by mouth daily before breakfast.   Yes Historical Provider, MD  levothyroxine (SYNTHROID, LEVOTHROID) 125 MCG tablet Take 1 tablet (125 mcg total) by mouth daily before breakfast. 02/27/15  Yes Delfina Redwood, MD  lisinopril (PRINIVIL,ZESTRIL) 20 MG tablet Take 20 mg by mouth daily.   Yes Historical Provider, MD  magnesium oxide (MAG-OX) 400 MG tablet Take 400 mg by mouth at bedtime.    Yes Historical Provider, MD  Melatonin 10 MG TABS Take  20 mg by mouth at bedtime.   Yes Historical Provider, MD  metoprolol succinate (TOPROL-XL) 25 MG 24 hr tablet Take 25 mg by mouth daily.  01/15/15  Yes Historical Provider, MD  Multiple Vitamin (MULTIVITAMIN WITH MINERALS) TABS tablet Take 1 tablet by mouth daily.   Yes Historical Provider, MD  PRESCRIPTION MEDICATION Inject 36 Units into the skin daily. Insulin   Yes Historical Provider, MD  tamsulosin (FLOMAX) 0.4 MG CAPS capsule Take 0.4 mg by mouth daily at 12 noon.  01/15/15  Yes Historical Provider, MD  azithromycin (ZITHROMAX) 250 MG tablet Take 1 tablet (250 mg total) by mouth daily. Through 03/02/15, then stop Patient not taking: Reported on 03/03/2016 02/27/15   Delfina Redwood, MD  chlorhexidine (PERIDEX) 0.12 % solution 15 mLs by Mouth Rinse route 2 (two) times daily. Patient not taking: Reported on 03/03/2016 01/25/15   Geradine Girt, DO  guaiFENesin (MUCINEX) 600 MG 12 hr tablet Take 1 tablet (600 mg total) by mouth 2 (two) times daily. For 1 week, then as needed for cough Patient not taking: Reported on 03/03/2016 02/27/15   Delfina Redwood, MD  insulin glargine (LANTUS) 100 UNIT/ML injection Inject 0.1 mLs (10 Units total) into the skin at bedtime. Patient not taking: Reported on 03/03/2016 01/25/15   Geradine Girt, DO  ipratropium-albuterol (DUONEB) 0.5-2.5 (3) MG/3ML SOLN Take 3 mLs by nebulization 4 (four) times daily. Patient not taking: Reported on 03/03/2016 02/27/15   Delfina Redwood, MD    Physical Exam: Vitals:   03/04/16 0015 03/04/16 0045 03/04/16 0115 03/04/16 0203  BP: 127/59 117/64 109/61 95/73  Pulse: 74 79 73 67  Resp: 21 16 16 18   Temp:    97.8 F (36.6 C)  TempSrc:    Oral  SpO2: 99% 98% 96% 99%  Weight:    241 lb 6.5 oz (109.5 kg)  Height:          Constitutional: Not in distress. Vitals:   03/04/16 0015 03/04/16 0045 03/04/16 0115 03/04/16 0203  BP: 127/59 117/64 109/61 95/73  Pulse: 74 79 73 67  Resp: 21 16 16 18   Temp:    97.8 F (36.6 C)  TempSrc:    Oral  SpO2: 99% 98% 96% 99%  Weight:    241 lb 6.5 oz (109.5 kg)  Height:       Eyes: Anicteric no pallor. ENMT: No discharge from the ears eyes nose and mouth. Neck: No mass felt. No neck rigidity. Respiratory: No rhonchi, crepitations heard Cardiovascular: S1-S2 heard. Abdomen: Soft nontender bowel sounds present. No guarding or rigidity. Musculoskeletal: No edema. Mild pain in the left hip and knee on flexing. Skin: No rash. Neurologic: Alert and  awake oriented to his name. Moves all extremities. Psychiatric: Appears confused.   Labs on Admission: I have personally reviewed following labs and imaging studies  CBC:  Recent Labs Lab 03/03/16 1730  WBC 12.5*  NEUTROABS 10.4*  HGB 11.7*  HCT 37.9*  MCV 92.4  PLT [id number]   Basic Metabolic Panel:  Recent Labs Lab 03/03/16 1730  NA 132*  K 3.3*  CL 93*  CO2 29  GLUCOSE 375*  BUN 39*  CREATININE 2.73*  CALCIUM 11.2*   GFR: Estimated Creatinine Clearance: 31.3 mL/min (by C-G formula based on SCr of 2.73 mg/dL). Liver Function Tests:  Recent Labs Lab 03/03/16 1730  AST 33  ALT 22  ALKPHOS 113  BILITOT 0.4  PROT 6.8  ALBUMIN 4.0   No results for  input(s): LIPASE, AMYLASE in the last 168 hours. No results for input(s): AMMONIA in the last 168 hours. Coagulation Profile: No results for input(s): INR, PROTIME in the last 168 hours. Cardiac Enzymes: No results for input(s): CKTOTAL, CKMB, CKMBINDEX, TROPONINI in the last 168 hours. BNP (last 3 results) No results for input(s): PROBNP in the last 8760 hours. HbA1C: No results for input(s): HGBA1C in the last 72 hours. CBG:  Recent Labs Lab 03/04/16 0215  GLUCAP 277*   Lipid Profile: No results for input(s): CHOL, HDL, LDLCALC, TRIG, CHOLHDL, LDLDIRECT in the last 72 hours. Thyroid Function Tests: No results for input(s): TSH, T4TOTAL, FREET4, T3FREE, THYROIDAB in the last 72 hours. Anemia Panel: No results for input(s): VITAMINB12, FOLATE, FERRITIN, TIBC, IRON, RETICCTPCT in the last 72 hours. Urine analysis:    Component Value Date/Time   COLORURINE YELLOW 03/03/2016 2110   APPEARANCEUR CLEAR 03/03/2016 2110   LABSPEC 1.026 03/03/2016 2110   PHURINE 5.5 03/03/2016 2110   GLUCOSEU 100 (A) 03/03/2016 2110   HGBUR NEGATIVE 03/03/2016 2110   BILIRUBINUR SMALL (A) 03/03/2016 2110   KETONESUR NEGATIVE 03/03/2016 2110   PROTEINUR NEGATIVE 03/03/2016 2110   UROBILINOGEN 0.2 02/24/2015 2333   NITRITE  NEGATIVE 03/03/2016 2110   LEUKOCYTESUR NEGATIVE 03/03/2016 2110   Sepsis Labs: @LABRCNTIP (procalcitonin:4,lacticidven:4) )No results found for this or any previous visit (from the past 240 hour(s)).   Radiological Exams on Admission: Dg Chest 2 View  Result Date: 03/03/2016 CLINICAL DATA:  Initial evaluation for acute cough. Diminished breath sounds bilaterally. EXAM: CHEST  2 VIEW COMPARISON:  Prior radiograph from 10/01/2015. FINDINGS: Cardiac and mediastinal silhouettes are stable in size and contour, and remain within normal limits. Aortic atherosclerosis noted. Lung volumes are mildly increased bilaterally with attenuation of the pulmonary markings, likely related underlying emphysema. No focal infiltrate, pulmonary edema, or pleural effusion. Mild scarring noted within the lingula, similar to prior. No pneumothorax. No acute osseous abnormality. IMPRESSION: 1. No active cardiopulmonary disease. 2. Emphysema. 3. Aortic atherosclerosis. Electronically Signed   By: Jeannine Boga M.D.   On: 03/03/2016 17:54   Ct Head Wo Contrast  Result Date: 03/03/2016 CLINICAL DATA:  Unwitnessed fall to concrete. EXAM: CT HEAD WITHOUT CONTRAST CT CERVICAL SPINE WITHOUT CONTRAST TECHNIQUE: Multidetector CT imaging of the head and cervical spine was performed following the standard protocol without intravenous contrast. Multiplanar CT image reconstructions of the cervical spine were also generated. COMPARISON:  02/24/2015 FINDINGS: CT HEAD FINDINGS There is no intracranial hemorrhage, mass or evidence of acute infarction. There is mild generalized atrophy. There is mild chronic microvascular ischemic change. There is no significant extra-axial fluid collection.No acute intracranial findings are evident. The calvarium and skullbase are intact. Visible paranasal sinuses and orbits are unremarkable. CT CERVICAL SPINE FINDINGS There is anterior interbody fusion with anterior plate screw fixation hardware at C3-4.  The interbody fusion is solidly ossified. The vertebral column, pedicles and facet articulations are intact. There is no evidence of acute fracture. No acute soft tissue abnormalities are evident. Moderate cervical facet arthritis is present, particularly from C3 through C6. No bone lesion or bony destruction. IMPRESSION: 1. No acute intracranial findings. There is moderate generalized atrophy and chronic appearing white matter hypodensities which likely represent small vessel ischemic disease. 2. Negative for acute cervical spine fracture. Solidly ossified C3-4 interbody fusion with anterior fixation hardware. Electronically Signed   By: Andreas Newport M.D.   On: 03/03/2016 22:47   Ct Cervical Spine Wo Contrast  Result Date: 03/03/2016 CLINICAL DATA:  Unwitnessed fall to concrete. EXAM: CT HEAD WITHOUT CONTRAST CT CERVICAL SPINE WITHOUT CONTRAST TECHNIQUE: Multidetector CT imaging of the head and cervical spine was performed following the standard protocol without intravenous contrast. Multiplanar CT image reconstructions of the cervical spine were also generated. COMPARISON:  02/24/2015 FINDINGS: CT HEAD FINDINGS There is no intracranial hemorrhage, mass or evidence of acute infarction. There is mild generalized atrophy. There is mild chronic microvascular ischemic change. There is no significant extra-axial fluid collection.No acute intracranial findings are evident. The calvarium and skullbase are intact. Visible paranasal sinuses and orbits are unremarkable. CT CERVICAL SPINE FINDINGS There is anterior interbody fusion with anterior plate screw fixation hardware at C3-4. The interbody fusion is solidly ossified. The vertebral column, pedicles and facet articulations are intact. There is no evidence of acute fracture. No acute soft tissue abnormalities are evident. Moderate cervical facet arthritis is present, particularly from C3 through C6. No bone lesion or bony destruction. IMPRESSION: 1. No acute  intracranial findings. There is moderate generalized atrophy and chronic appearing white matter hypodensities which likely represent small vessel ischemic disease. 2. Negative for acute cervical spine fracture. Solidly ossified C3-4 interbody fusion with anterior fixation hardware. Electronically Signed   By: Andreas Newport M.D.   On: 03/03/2016 22:47    EKG: Independently reviewed. Normal sinus rhythm. Incomplete right bundle branch block.  Assessment/Plan Principal Problem:   Acute encephalopathy Active Problems:   Type 2 diabetes mellitus with hyperglycemia ([facility])   Essential hypertension   COPD (chronic obstructive pulmonary disease) ([facility])   Acute renal failure ([place])    1. Acute encephalopathy - cause not clear. At this time I have ordered MRI brain EEG ammonia levels RPR and TSH B 12 and folate levels. We'll continue with the hydration since patient has acute on chronic renal failure with elevated lactate. Follow blood cultures and urine cultures. Follow lactate levels. Patient is afebrile. 2. Acute renal failure on chronic kidney disease stage III - will hold lisinopril. Continue with hydration. Closely follow intake output. 3. Hypertension - continue amlodipine. Hold lisinopril secondary to renal failure. When necessary IV hydralazine for systolic blood pressure more than 160. 4. Hypercalcemia - appears to be mild with no EKG changes. Since patient also has worsening renal function and has anemia will check SPEP. Continue with hydration and follow calcium levels. 5. Diabetes mellitus type 2 - sliding scale coverage with close follow-up of CBGs. On Glucotrol.  6. Chronic anemia - follow CBC. 7. History of COPD presently not wheezing. 8. Hypothyroidism - on Synthroid check TSH.   DVT prophylaxis: SCDs. Code Status: DO NOT RESUSCITATE.  Family Communication: Patient's daughter.  Disposition Plan: To be determined.  Consults called: None.  Admission status: Observation.     Rise Patience MD [facility] Pager [phone].  If 7PM-7AM, please contact night-coverage [email or web address] Password [email or web address]  03/04/2016, 3:06 AM

## 2016-03-04 NOTE — Progress Notes (Signed)
NURSING PROGRESS NOTE  Harold Scronce StewartMRN: [id number] Admission Data: 03/04/16 Attending Provider: Rise Patience, MD PCP: Dwan Bolt, MD Code status: Full  Allergies:  Allergies  Allergen Reactions  . Prednisone     REACTION: Reaction not known   Past Medical History:  Past Medical History:  Diagnosis Date  . COPD (chronic obstructive pulmonary disease) ([place])   . Diabetes mellitus without complication ([place])   . Hypertension   . Hypothyroidism   . Shortness of breath   . TIA (transient ischemic attack)    Past Surgical History:  Past Surgical History:  Procedure Laterality Date  . COLONOSCOPY N/A 09/23/2013   Procedure: COLONOSCOPY;  Surgeon: Beryle Beams, MD;  Location: [facility];  Service: Endoscopy;  Laterality: N/A;  . ESOPHAGOGASTRODUODENOSCOPY N/A 09/23/2013   Procedure: ESOPHAGOGASTRODUODENOSCOPY (EGD);  Surgeon: Beryle Beams, MD;  Location: [facility];  Service: Endoscopy;  Laterality: N/A;  . GIVENS CAPSULE STUDY N/A 10/07/2013   Procedure: GIVENS CAPSULE STUDY;  Surgeon: Beryle Beams, MD;  Location: [facility];  Service: Endoscopy;  Laterality: N/A;  . surgery for bladder cancer  yrs ago    Harold Martin is a 75 y.o. male patient, arrived to floor in room [id number] via stretcher, transferred from ED. Patient alert and oriented X 1. No acute distress noted.   Vital signs: Oral temperature 97.8 F (36.6 C), Blood pressure 95/73, Pulse 67, RR 18, SpO2 99 % on room air. Height 6'2", weight 241 lbs (109.5 kg).   Cardiac monitoring: Telemetry box 5W #14 in place. Verified by Parks Ranger, RN  IV access: Left AC-saline locked; condition patent and no redness; dressing is clean, dry, and intact.  Skin: intact, no pressure ulcer noted in sacral area. Abrasions noted on both legs bilaterally with bruising noted on left elbow.  Patient's ID armband verified with patient and in place. Second verified by Rosendo Gros. Information packet given to  patient. Fall risk assessed, SR up X2, patient instructed to call nurse or staff to assist before getting out of bed.Call bell within reach.

## 2016-03-04 NOTE — Progress Notes (Signed)
PT Cancellation Note  Patient Details Name: Harold Martin MRN: [id number] DOB: [birth year]   Cancelled Treatment:    Reason Eval/Treat Not Completed: Patient at procedure or test/unavailable. Pt in MRI.   Lorriane Shire 03/04/2016, 9:56 AM

## 2016-03-04 NOTE — Progress Notes (Signed)
PROGRESS NOTE  Harold Martin  [id number] DOB: 02-05-1941 DOA: 03/03/2016 PCP: Dwan Bolt, MD  Outpatient Specialists: None  Brief Narrative: Harold Martin is a 75 y.o. male with a history of IDDM, stage III CKD, HTN, hypothyroidism, chronic anemia brought from home for confusion and unwitnessed fall. Per patient's daughter patient lives alone and has grown more confused for 3 days, and was found the day PTA with nasal bleed. He has a history of similar presentation last year without defined etiology. Work up to date has been negative. CT head showed small vessel ischemic disease only, MRI brain was negative for acute ischemia. EEG on 8/29 showed moderate diffuse slowing and no epileptiform discharges. Imaging for presumed fall including CT neck, and pelvis XR, has been unremarkable. Urinalysis, CXR, and skin exam show no evidence of infection, WBC and lactate are normal. No hypercarbia on ABG. ECG unchanged from prior. Vit B12, troponin, CK, TSH, RPR, UDS all checked and normal/negative. He exhibits AKI on CKD that has improved with IVF. Hypokalemia is being repleted, but he remains confused. PT recommends SNF disposition and daughter agrees.  Assessment & Plan: Principal Problem:   Acute encephalopathy Active Problems:   Type 2 diabetes mellitus with hyperglycemia (HCC)   Essential hypertension   COPD (chronic obstructive pulmonary disease) (HCC)   Acute renal failure (HCC)  Acute encephalopathy: Work up thus far unrevealing. ?Stepwise deterioration of vascular dementia.  - Continue repletion of electrolytes - Continue IVF's for AKI as below - Follow cultures - Will need 24 hour supervision, SNF disposition.  Acute renal failure on chronic kidney disease stage III:  - Hold lisinopril and continue IVFs.   Hypertension: Pressures currently soft. Do not suspect this is due to sepsis.  - Continue metoprolol - Hold amlodipine and lisinopril and monitor.  - Hydralazine prn  SBP > 182mmHg - Check AM cortisol  Hypercalcemia: Mild, historically hypocalcemic, likely due to hypoalbuminemia. No characteristic ECG changes. Improving with IVF's - SPEP checked and pending in setting of renal impairment and anemia. No lytic lesions on radiographs.  T2DM: OSU continued and inadequate control with SSI despite poor po. - Restart 1/2 NPH dose and monitor - Continue glipizide, SSI, and CBGs qAC/HS  Chronic anemia:  - Follow CBC.  History of COPD: Normal lung exam.  Hypothyroidism: TSH 1.680.  - Continue home synthroid dose  DVT prophylaxis: SCDs Code Status: DNR Family Communication: Patient's daughter Disposition Plan: Continue observation, likely D/C to SNF in next 24 hours.  Consultants:   Neurology, Dr. Delice Lesch  Procedures:  EEG 8/29:  Impression: This awake and drowsy EEG is abnormal due to moderate diffuse slowing of the waking background. The above findings indicates diffuse cerebral dysfunction that is non-specific in etiology and can be seen with hypoxic/ischemic injury, toxic/metabolic encephalopathies, neurodegenerative disorders, or medication effect.  The absence of epileptiform discharges does not rule out a clinical diagnosis of epilepsy.  Clinical correlation is advised.  Antimicrobials:  None  Subjective: Pt reports no pain, appears drowsy but following commands.  Objective: Vitals:   03/04/16 0419 03/04/16 0420 03/04/16 0623 03/04/16 1136  BP:   (!) 106/53 (!) 105/54  Pulse:   79   Resp:   19   Temp: 97.9 F (36.6 C)  97.6 F (36.4 C)   TempSrc:   Oral   SpO2:  97% 96%   Weight:      Height:        Intake/Output Summary (Last 24 hours) at 03/04/16 1602 Last data  filed at 03/04/16 1300  Gross per 24 hour  Intake           622.92 ml  Output               50 ml  Net           572.92 ml   Filed Weights   03/03/16 1728 03/04/16 0203  Weight: 108.9 kg (240 lb) 109.5 kg (241 lb 6.5 oz)    Examination: General exam: 75 y.o.  male laying flat sleeping in bed Respiratory system: Non-labored breathing. Clear to auscultation bilaterally.  Cardiovascular system: Regular rate and rhythm. No murmur, rub, or gallop. No JVD, and no pedal edema. Gastrointestinal system: Abdomen soft, non-tender, non-distended, with normoactive bowel sounds. No organomegaly or masses felt. Central nervous system: Alert and oriented. No focal neurological deficits. Extremities: Warm, no deformities. Full AROM of elbows, knees.  Skin: Well healed scabs on dorsal left forearm and elbow. No bleeding or rash. Psychiatry: Judgement and insight appear normal. Mood & affect appropriate.   Data Reviewed: I have personally reviewed following labs and imaging studies  CBC:  Recent Labs Lab 03/03/16 1730 03/04/16 0404  WBC 12.5* 8.7  NEUTROABS 10.4*  --   HGB 11.7* 10.8*  HCT 37.9* 35.4*  MCV 92.4 91.2  PLT 239 0000000   Basic Metabolic Panel:  Recent Labs Lab 03/03/16 1730 03/04/16 0404  NA 132* 134*  K 3.3* 2.9*  CL 93* 94*  CO2 29 31  GLUCOSE 375* 258*  BUN 39* 36*  CREATININE 2.73* 2.36*  CALCIUM 11.2* 10.6*   GFR: Estimated Creatinine Clearance: 36.2 mL/min (by C-G formula based on SCr of 2.36 mg/dL). Liver Function Tests:  Recent Labs Lab 03/03/16 1730 03/04/16 0404  AST 33 27  ALT 22 20  ALKPHOS 113 101  BILITOT 0.4 0.6  PROT 6.8 6.3*  ALBUMIN 4.0 3.5   No results for input(s): LIPASE, AMYLASE in the last 168 hours.  Recent Labs Lab 03/04/16 0404  AMMONIA 12   Coagulation Profile: No results for input(s): INR, PROTIME in the last 168 hours. Cardiac Enzymes:  Recent Labs Lab 03/04/16 0404  CKTOTAL 390   BNP (last 3 results) No results for input(s): PROBNP in the last 8760 hours. HbA1C: No results for input(s): HGBA1C in the last 72 hours. CBG:  Recent Labs Lab 03/04/16 0215 03/04/16 0743 03/04/16 1227  GLUCAP 277* 216* 282*   Lipid Profile: No results for input(s): CHOL, HDL, LDLCALC, TRIG,  CHOLHDL, LDLDIRECT in the last 72 hours. Thyroid Function Tests:  Recent Labs  03/04/16 0404  TSH 1.680   Anemia Panel:  Recent Labs  03/04/16 0404  VITAMINB12 833   Urine analysis:    Component Value Date/Time   COLORURINE YELLOW 03/03/2016 2110   APPEARANCEUR CLEAR 03/03/2016 2110   LABSPEC 1.026 03/03/2016 2110   PHURINE 5.5 03/03/2016 2110   GLUCOSEU 100 (A) 03/03/2016 2110   HGBUR NEGATIVE 03/03/2016 2110   BILIRUBINUR SMALL (A) 03/03/2016 2110   KETONESUR NEGATIVE 03/03/2016 2110   PROTEINUR NEGATIVE 03/03/2016 2110   UROBILINOGEN 0.2 02/24/2015 2333   NITRITE NEGATIVE 03/03/2016 2110   LEUKOCYTESUR NEGATIVE 03/03/2016 2110   Sepsis Labs: @LABRCNTIP (procalcitonin:4,lacticidven:4)  ) Recent Results (from the past 240 hour(s))  Culture, blood (Routine X 2)     Status: None (Preliminary result)   Collection Time: 03/03/16  5:35 PM  Result Value Ref Range Status   Specimen Description BLOOD RIGHT ANTECUBITAL  Final   Special Requests BOTTLES DRAWN  AEROBIC AND ANAEROBIC 5CC  Final   Culture NO GROWTH < 24 HOURS  Final   Report Status PENDING  Incomplete  Culture, blood (Routine X 2)     Status: None (Preliminary result)   Collection Time: 03/03/16 11:15 PM  Result Value Ref Range Status   Specimen Description BLOOD LEFT ARM  Final   Special Requests BOTTLES DRAWN AEROBIC AND ANAEROBIC 6ML  Final   Culture NO GROWTH < 12 HOURS  Final   Report Status PENDING  Incomplete     Radiology Studies: Dg Chest 2 View  Result Date: 03/03/2016 CLINICAL DATA:  Initial evaluation for acute cough. Diminished breath sounds bilaterally. EXAM: CHEST  2 VIEW COMPARISON:  Prior radiograph from 10/01/2015. FINDINGS: Cardiac and mediastinal silhouettes are stable in size and contour, and remain within normal limits. Aortic atherosclerosis noted. Lung volumes are mildly increased bilaterally with attenuation of the pulmonary markings, likely related underlying emphysema. No focal  infiltrate, pulmonary edema, or pleural effusion. Mild scarring noted within the lingula, similar to prior. No pneumothorax. No acute osseous abnormality. IMPRESSION: 1. No active cardiopulmonary disease. 2. Emphysema. 3. Aortic atherosclerosis. Electronically Signed   By: Jeannine Boga M.D.   On: 03/03/2016 17:54   Dg Pelvis 1-2 Views  Result Date: 03/04/2016 CLINICAL DATA:  Fall. EXAM: PELVIS - 1-2 VIEW COMPARISON:  No prior. FINDINGS: Degenerative changes lumbar spine and both hips. No acute bony or joint abnormality identified. No evidence of fracture or dislocation. IMPRESSION: Degenerative changes lumbar spine and both hips. No acute abnormality . Electronically Signed   By: Marcello Moores  Register   On: 03/04/2016 07:14   Dg Knee 1-2 Views Left  Result Date: 03/04/2016 CLINICAL DATA:  Pain swelling. EXAM: LEFT KNEE - 1-2 VIEW COMPARISON:  No recent prior . FINDINGS: Tricompartment degenerative change. Chondrocalcinosis. No acute bony abnormality identified. Small knee joint effusion. Peripheral vascular calcification . IMPRESSION: 1. Tricompartment degenerative change with chondrocalcinosis. No acute bony abnormality. 2.  Small knee joint effusion. 3. Peripheral vascular disease. Electronically Signed   By: Marcello Moores  Register   On: 03/04/2016 07:22   Ct Head Wo Contrast  Result Date: 03/03/2016 CLINICAL DATA:  Unwitnessed fall to concrete. EXAM: CT HEAD WITHOUT CONTRAST CT CERVICAL SPINE WITHOUT CONTRAST TECHNIQUE: Multidetector CT imaging of the head and cervical spine was performed following the standard protocol without intravenous contrast. Multiplanar CT image reconstructions of the cervical spine were also generated. COMPARISON:  02/24/2015 FINDINGS: CT HEAD FINDINGS There is no intracranial hemorrhage, mass or evidence of acute infarction. There is mild generalized atrophy. There is mild chronic microvascular ischemic change. There is no significant extra-axial fluid collection.No acute  intracranial findings are evident. The calvarium and skullbase are intact. Visible paranasal sinuses and orbits are unremarkable. CT CERVICAL SPINE FINDINGS There is anterior interbody fusion with anterior plate screw fixation hardware at C3-4. The interbody fusion is solidly ossified. The vertebral column, pedicles and facet articulations are intact. There is no evidence of acute fracture. No acute soft tissue abnormalities are evident. Moderate cervical facet arthritis is present, particularly from C3 through C6. No bone lesion or bony destruction. IMPRESSION: 1. No acute intracranial findings. There is moderate generalized atrophy and chronic appearing white matter hypodensities which likely represent small vessel ischemic disease. 2. Negative for acute cervical spine fracture. Solidly ossified C3-4 interbody fusion with anterior fixation hardware. Electronically Signed   By: Andreas Newport M.D.   On: 03/03/2016 22:47   Ct Cervical Spine Wo Contrast  Result Date: 03/03/2016 CLINICAL  DATA:  Unwitnessed fall to concrete. EXAM: CT HEAD WITHOUT CONTRAST CT CERVICAL SPINE WITHOUT CONTRAST TECHNIQUE: Multidetector CT imaging of the head and cervical spine was performed following the standard protocol without intravenous contrast. Multiplanar CT image reconstructions of the cervical spine were also generated. COMPARISON:  02/24/2015 FINDINGS: CT HEAD FINDINGS There is no intracranial hemorrhage, mass or evidence of acute infarction. There is mild generalized atrophy. There is mild chronic microvascular ischemic change. There is no significant extra-axial fluid collection.No acute intracranial findings are evident. The calvarium and skullbase are intact. Visible paranasal sinuses and orbits are unremarkable. CT CERVICAL SPINE FINDINGS There is anterior interbody fusion with anterior plate screw fixation hardware at C3-4. The interbody fusion is solidly ossified. The vertebral column, pedicles and facet  articulations are intact. There is no evidence of acute fracture. No acute soft tissue abnormalities are evident. Moderate cervical facet arthritis is present, particularly from C3 through C6. No bone lesion or bony destruction. IMPRESSION: 1. No acute intracranial findings. There is moderate generalized atrophy and chronic appearing white matter hypodensities which likely represent small vessel ischemic disease. 2. Negative for acute cervical spine fracture. Solidly ossified C3-4 interbody fusion with anterior fixation hardware. Electronically Signed   By: Andreas Newport M.D.   On: 03/03/2016 22:47   Mr Brain Wo Contrast  Result Date: 03/04/2016 CLINICAL DATA:  New onset confusion EXAM: MRI HEAD WITHOUT CONTRAST TECHNIQUE: Multiplanar, multiecho pulse sequences of the brain and surrounding structures were obtained without intravenous contrast. COMPARISON:  CT head 03/03/2016.  MRI 01/20/2015 FINDINGS: Ventricle size normal.  Mild atrophy, consistent with age. Negative for acute infarct. Small white matter hyperintensities bilaterally compatible with chronic microvascular ischemia, as expected for age. Brainstem and cerebellum normal. Basal ganglia normal. Negative for intracranial hemorrhage.  Negative for mass or edema. Mild mucosal edema in the paranasal sinuses. Normal orbital structures. Pituitary not enlarged. IMPRESSION: Age-appropriate atrophy.  No acute abnormality Image quality degraded by motion. Electronically Signed   By: Franchot Gallo M.D.   On: 03/04/2016 11:22    Scheduled Meds: . amLODipine  5 mg Oral q morning - 10a  . aspirin  81 mg Oral Daily  . glipiZIDE  10 mg Oral QAC breakfast  . insulin aspart  0-9 Units Subcutaneous TID WC  . levothyroxine  125 mcg Oral QAC breakfast  . magnesium oxide  400 mg Oral QHS  . metoprolol succinate  25 mg Oral Daily  . multivitamin with minerals  1 tablet Oral Daily  . potassium chloride  30 mEq Oral BID  . tamsulosin  0.4 mg Oral Q1200    Continuous Infusions: . sodium chloride       LOS: 0 days   Time spent: 25 minutes.  Vance Gather, MD [facility] Pager [phone]  If 7PM-7AM, please contact night-coverage [email or web address] Password [email or web address] 03/04/2016, 4:02 PM

## 2016-03-04 NOTE — ED Notes (Signed)
Attempted report 

## 2016-03-04 NOTE — Care Management Obs Status (Signed)
[facility] NOTIFICATION   Patient Details  Name: Harold Martin MRN: [id number] Date of Birth: 1940/11/26   Medicare Observation Status Notification Given:  Yes    Carles Collet, RN 03/04/2016, 1:40 PM

## 2016-03-04 NOTE — Progress Notes (Signed)
EEG Completed; Results Pending  

## 2016-03-04 NOTE — Evaluation (Signed)
Occupational Therapy Evaluation Patient Details Name: Harold Martin MRN: [id number] DOB: 08/22/1940 Today's Date: 03/04/2016    History of Present Illness 75 y.o. male admitted for AMS and a mechanical fall at home. PMH significant for HTN, HLD, COPD, dyspnea on exertion, DM type II, CKD stage 3, bladder carcinoma, and swallowing difficulty.    Clinical Impression   PTA, pt's granddaughter reports that he was typically independent with ADLs, IADLs and used RW for in-home mobility. Pt currently presents with confusion, generalized weakness, and balance deficits impacting his ability to carry out ADLs and transfers independently and safely. Pt currently requires mod-max +2 assist for LB ADLs and basic transfers and max multimodal cues. Pt's family plans for pt to go to SNF for post-acute rehab as they cannot provide 24/7 assistance. Pt will benefit from continued acute OT to maximize independence and safety with ADLs and mobility. Will continue to follow acutely.    Follow Up Recommendations  SNF;Supervision/Assistance - 24 hour    Equipment Recommendations  Other (comment) (TBD at next venue of care)    Recommendations for Other Services       Precautions / Restrictions Precautions Precautions: Fall Restrictions Weight Bearing Restrictions: No      Mobility Bed Mobility Overal bed mobility: Needs Assistance;+2 for physical assistance Bed Mobility: Supine to Sit     Supine to sit: Mod assist;+2 for physical assistance     General bed mobility comments: Assist for trunk support to come to sitting position and scoot hips to EOB. Multimodal cues for initiation and to sequence through each task.  Transfers Overall transfer level: Needs assistance Equipment used: 2 person hand held assist Transfers: Sit to/from Stand Sit to Stand: Max assist;+2 physical assistance         General transfer comment: Assist for boost to stand and to stabilize balance upon standing.  Multimodal cues for initiation and to sequence through each part of transfer.    Balance Overall balance assessment: Needs assistance Sitting-balance support: No upper extremity supported;Feet supported Sitting balance-Leahy Scale: Fair     Standing balance support: Bilateral upper extremity supported;During functional activity Standing balance-Leahy Scale: Poor                              ADL Overall ADL's : Needs assistance/impaired   Eating/Feeding Details (indicate cue type and reason): despite multimodal cues pt very confused and unwilling to eat despite verbalizing that he "was starving"                     Toilet Transfer: Maximal assistance;+2 for physical assistance;Stand-pivot Toilet Transfer Details (indicate cue type and reason): simulated to/from chair Toileting- Clothing Manipulation and Hygiene: Maximal assistance;+2 for physical assistance;Sit to/from stand       Functional mobility during ADLs: Maximal assistance;+2 for physical assistance General ADL Comments: Very confused and kept eyes closed most of session.      Vision     Perception     Praxis      Pertinent Vitals/Pain Pain Assessment: Faces Faces Pain Scale: No hurt     Hand Dominance     Extremity/Trunk Assessment Upper Extremity Assessment Upper Extremity Assessment: Generalized weakness   Lower Extremity Assessment Lower Extremity Assessment: Generalized weakness       Communication     Cognition Arousal/Alertness: Lethargic Behavior During Therapy: Flat affect Overall Cognitive Status: Impaired/Different from baseline Area of Impairment: Attention;Orientation;Memory;Following commands;Safety/judgement;Awareness;Problem solving Orientation Level: Disoriented  to;Place;Time;Situation Current Attention Level: Focused Memory: Decreased short-term memory Following Commands: Follows one step commands inconsistently Safety/Judgement: Decreased awareness of  deficits;Decreased awareness of safety Awareness: Intellectual Problem Solving: Slow processing;Decreased initiation;Requires tactile cues;Requires verbal cues;Difficulty sequencing General Comments: In repsonse to orientation questions pt answer "[facility]." Pt kept eyes closed most of session despite multimodal cues to open them. Pt would repsond inappropriately to therapist's questions and would often repeat words therapist had used.    General Comments       Exercises       Shoulder Instructions      Home Living Family/patient expects to be discharged to:: Private residence Living Arrangements: Alone Available Help at Discharge: Family;Available PRN/intermittently Type of Home: Mobile home Home Access: Stairs to enter     Home Layout: One level     Bathroom Shower/Tub: Teacher, early years/pre: Standard     Home Equipment: Environmental consultant - 2 wheels;Bedside commode   Additional Comments: Information provided by pt's granddaughter who is a NT on the unit. She is unsure if pt has a BSC or not.      Prior Functioning/Environment Level of Independence: Needs assistance  Gait / Transfers Assistance Needed: Uses RW at home ADL's / Homemaking Assistance Needed: Granddaughter reports that pt is typically independent with all ADLs, makes own meals using microwave primarily, and drives to appointments and errands        OT Diagnosis: Generalized weakness;Cognitive deficits;Altered mental status   OT Problem List: Decreased strength;Decreased activity tolerance;Impaired balance (sitting and/or standing);Decreased safety awareness;Decreased cognition;Decreased knowledge of use of DME or AE;Obesity   OT Treatment/Interventions: Self-care/ADL training;Therapeutic exercise;DME and/or AE instruction;Therapeutic activities;Patient/family education;Balance training;Cognitive remediation/compensation    OT Goals(Current goals can be found in the care plan  section) Acute Rehab OT Goals Patient Stated Goal: Pt's granddaughter would like for pt to get better and receive more home health aide assistance OT Goal Formulation: With family Time For Goal Achievement: 03/18/16 Potential to Achieve Goals: Good ADL Goals Pt Will Perform Grooming: with set-up;sitting Pt Will Perform Upper Body Bathing: with set-up;sitting Pt Will Perform Lower Body Bathing: with mod assist;sit to/from stand Pt Will Transfer to Toilet: with mod assist;ambulating;bedside commode (over toilet) Pt Will Perform Toileting - Clothing Manipulation and hygiene: with mod assist;sit to/from stand Additional ADL Goal #1: Pt will follow 1-step commands consistently with min cueing.  OT Frequency: Min 2X/week   Barriers to D/C: Decreased caregiver support  Lives alone; family unable to provide 24/7 assistance       Co-evaluation              End of Session Equipment Utilized During Treatment: Gait belt Nurse Communication: Mobility status;Other (comment)  Activity Tolerance: Patient tolerated treatment well Patient left: in chair;with call bell/phone within reach;with chair alarm set;with family/visitor present   Time: [id number] OT Time Calculation (min): 28 min Charges:  OT General Charges $OT Visit: 1 Procedure OT Evaluation $OT Eval High Complexity: 1 Procedure OT Treatments $Self Care/Home Management : 8-22 mins G-Codes: OT G-codes **NOT FOR INPATIENT CLASS** Functional Assessment Tool Used: clinical judgement Functional Limitation: Self care Self Care Current Status [id number]): At least 40 percent but less than 60 percent impaired, limited or restricted Self Care Goal Status [id number]): At least 20 percent but less than 40 percent impaired, limited or restricted  Redmond Baseman, OTR/L Pager: [phone] 03/04/2016, 3:27 PM

## 2016-03-05 DIAGNOSIS — I10 Essential (primary) hypertension: Secondary | ICD-10-CM

## 2016-03-05 DIAGNOSIS — N178 Other acute kidney failure: Secondary | ICD-10-CM

## 2016-03-05 DIAGNOSIS — G9340 Encephalopathy, unspecified: Secondary | ICD-10-CM | POA: Diagnosis not present

## 2016-03-05 LAB — PROTEIN ELECTROPHORESIS, SERUM
A/G Ratio: 1.3 (ref 0.7–1.7)
Albumin ELP: 3.3 g/dL (ref 2.9–4.4)
Alpha-1-Globulin: 0.2 g/dL (ref 0.0–0.4)
Alpha-2-Globulin: 0.8 g/dL (ref 0.4–1.0)
BETA GLOBULIN: 0.9 g/dL (ref 0.7–1.3)
GAMMA GLOBULIN: 0.8 g/dL (ref 0.4–1.8)
Globulin, Total: 2.6 g/dL (ref 2.2–3.9)
M-Spike, %: 0.1 g/dL — ABNORMAL HIGH
Total Protein ELP: 5.9 g/dL — ABNORMAL LOW (ref 6.0–8.5)

## 2016-03-05 LAB — URINE CULTURE: Culture: <10000 — AB

## 2016-03-05 LAB — BASIC METABOLIC PANEL
Anion gap: 9 (ref 5–15)
BUN: 28 mg/dL — AB (ref 6–20)
CO2: 27 mmol/L (ref 22–32)
CREATININE: 2.07 mg/dL — AB (ref 0.61–1.24)
Calcium: 10.2 mg/dL (ref 8.9–10.3)
Chloride: 101 mmol/L (ref 101–111)
GFR calc Af Amer: 35 mL/min — ABNORMAL LOW (ref >60)
GFR, EST NON AFRICAN AMERICAN: 30 mL/min — AB (ref >60)
GLUCOSE: 154 mg/dL — AB (ref 65–99)
POTASSIUM: 3.6 mmol/L (ref 3.5–5.1)
SODIUM: 137 mmol/L (ref 135–145)

## 2016-03-05 LAB — CORTISOL-AM, BLOOD: CORTISOL - AM: 13.7 ug/dL (ref 6.7–22.6)

## 2016-03-05 LAB — FOLATE RBC: HEMATOCRIT: 32.2 % — AB (ref 37.5–51.0)

## 2016-03-05 LAB — GLUCOSE, CAPILLARY
Glucose-Capillary: 177 mg/dL — ABNORMAL HIGH (ref 65–99)
Glucose-Capillary: 172 mg/dL — ABNORMAL HIGH (ref 65–99)

## 2016-03-05 NOTE — Plan of Care (Signed)
Problem: Education: Goal: Knowledge of [facility] Education information/materials will improve Outcome: Adequate for Discharge Pt kept informed and educated as much as possible while here, difficult due to confusion. Pt's family informed when here,  Dr Coralyn Pear spoke with pt's daughter today on test results. Pt discharging to SNF for continued care.

## 2016-03-05 NOTE — Clinical Social Work Placement (Signed)
   CLINICAL SOCIAL WORK PLACEMENT  NOTE  Date:  03/05/2016  Patient Details  Name: Harold Martin MRN: [id number] Date of Birth: 31-Mar-1941  Clinical Social Work is seeking post-discharge placement for this patient at the [place] level of care (*CSW will initial, date and re-position this form in  chart as items are completed):  Yes   Patient/family provided with [facility]'s list of facilities offering this level of care within the geographic area requested by the patient (or if unable, by the patient's family).  Yes   Patient/family informed of their freedom to choose among providers that offer the needed level of care, that participate in Medicare, Medicaid or managed care program needed by the patient, have an available bed and are willing to accept the patient.  Yes   Patient/family informed of [facility]'s ownership interest in [facility] and [facility], as well as of the fact that they are under no obligation to receive care at these facilities.  PASRR submitted to EDS on       PASRR number received on       Existing PASRR number confirmed on 03/05/16     FL2 transmitted to all facilities in geographic area requested by pt/family on 03/05/16     FL2 transmitted to all facilities within larger geographic area on       Patient informed that his/her managed care company has contracts with or will negotiate with certain facilities, including the following:        Yes   Patient/family informed of bed offers received.  Patient chooses bed at [facility] and [facility] recommends and patient chooses bed at      Patient to be transferred to [facility] on 03/05/16.  Patient to be transferred to facility by ptar     Patient family notified on 03/05/16 of transfer.  Name of family member notified:  christy     PHYSICIAN Please sign FL2, Please sign DNR     Additional Comment:     _______________________________________________ Jorge Ny, LCSW 03/05/2016, 1:57 PM

## 2016-03-05 NOTE — Plan of Care (Signed)
Problem: Health Behavior/Discharge Planning: Goal: Ability to manage health-related needs will improve Outcome: Progressing Pt discharging to SNF for continued care.

## 2016-03-05 NOTE — Discharge Summary (Signed)
Physician Discharge Summary  Harold Martin [id number] DOB: 1940-12-30 DOA: 03/03/2016  PCP: Dwan Bolt, MD  Admit date: 03/03/2016 Discharge date: 03/05/2016  Time spent: 35 minutes  Recommendations for Outpatient Follow-up:  1. Please follow up on blood sugars, he was discharged on Insulin NPH 30 units [place] daily, glipizide held due to AKI 2. Follow up on blood pressures, lisinopril was held due to AKI 3. Repeat BMP in 5 days, was admitted for AKI, initial labs showing Cr of 2.73, improved to 2.07 on day of discharge.   4. He was discharged to SNF on 03/05/2016   Discharge Diagnoses:  Principal Problem:   Acute encephalopathy Active Problems:   Type 2 diabetes mellitus with hyperglycemia (HCC)   Essential hypertension   COPD (chronic obstructive pulmonary disease) ([place])   Acute renal failure ([place])   Discharge Condition: Stable  Diet recommendation: Carb Mod/Heart Healthy  Filed Weights   03/03/16 1728 03/04/16 0203  Weight: 108.9 kg (240 lb) 109.5 kg (241 lb 6.5 oz)    History of present illness:  Harold Martin is a 75 y.o. male with diabetes mellitus type 2, chronic anemia, chronic kidney disease stage III, hypertension, hypothyroidism was brought to the ER after patient was found to be confused and had a nasal bleed. As per patient's daughter patient lives alone and was found to be mildly confused 3 days ago. Yesterday when patient's daughter went to check on the patient patient was sitting on a chair with some nasal bleed suspect from a fall. Patient had similar confusion episodes last year course of which was not clear. In the ER at this time CT head and neck was unremarkable. There is no definite infectious cause seen. On my exam patient is alert awake and oriented to his name and follows commands. Complains of some left hip and knee pain. Patient is being admitted for further management. As per the patient's daughter patient has not had any recent change in his  medications. Does not drink alcohol. ABG done in the ER does not show any carbon dioxide retention.   Hospital Course:  Harold Busbin Stewartis a 75 y.o.malewith a history of IDDM, stage III CKD, HTN, hypothyroidism, chronic anemia brought from home for confusion and unwitnessed fall. Per patient's daughter patient lives alone and has grown more confused for 3 days, and was found the day PTA with nasal bleed. He has a history of similar presentation last year without defined etiology. Work up to date has been negative. CT head showed small vessel ischemic disease only, MRI brain was negative for acute ischemia. EEG on 8/29 showed moderate diffuse slowing and no epileptiform discharges. Imaging for presumed fall including CT neck, and pelvis XR, has been unremarkable. Urinalysis, CXR, and skin exam show no evidence of infection, WBC and lactate are normal. No hypercarbia on ABG. ECG unchanged from prior. Vit B12, troponin, CK, TSH, RPR, UDS all checked and normal/negative. He exhibits AKI on CKD that has improved with IVF. Hypokalemia is being repleted, but he remains confused. PT recommends SNF disposition and daughter agrees. He was discharged to SNF on 03/05/2016 in stable condition.   Procedures: EEG 8/29:  Impression: This awake and drowsyEEG is abnormal due to moderatediffuse slowing of the waking background. The above findings indicates diffuse cerebral dysfunction that is non-specific in etiology and can be seen with hypoxic/ischemic injury, toxic/metabolic encephalopathies, neurodegenerative disorders, or medication effect. The absence of epileptiform discharges does not rule out a clinical diagnosis of epilepsy. Clinical correlation is  advised.  Consultations:  Neurology  Discharge Exam: Vitals:   03/04/16 2139 03/05/16 0505  BP: 119/88 130/61  Pulse: 67 82  Resp: 18 18  Temp: 98.2 F (36.8 C) 97.5 F (36.4 C)    General: He was assisted out of bed to chair, oriented x2   Cardiovascular: RRR nl S1S2 Respiratory: Nl inspiratory effort, having diminished breath sounds, bibasilar crackles.  Abd: Soft, NT, ND Extremities: no edema   Discharge Instructions   Discharge Instructions    Call MD for:    Complete by:  As directed   Call MD for:  difficulty breathing, headache or visual disturbances    Complete by:  As directed   Call MD for:  extreme fatigue    Complete by:  As directed   Call MD for:  hives    Complete by:  As directed   Call MD for:  persistant dizziness or light-headedness    Complete by:  As directed   Call MD for:  persistant nausea and vomiting    Complete by:  As directed   Call MD for:  redness, tenderness, or signs of infection (pain, swelling, redness, odor or green/yellow discharge around incision site)    Complete by:  As directed   Call MD for:  severe uncontrolled pain    Complete by:  As directed   Call MD for:  temperature >100.4    Complete by:  As directed   Diet - low sodium heart healthy    Complete by:  As directed   Increase activity slowly    Complete by:  As directed     Current Discharge Medication List    CONTINUE these medications which have NOT CHANGED   Details  amLODipine (NORVASC) 5 MG tablet Take 5 mg by mouth every morning.    aspirin (ASPIRIN CHILDRENS) 81 MG chewable tablet Chew 1 tablet (81 mg total) by mouth daily.    insulin NPH Human (HUMULIN N,NOVOLIN N) 100 UNIT/ML injection Inject 30 Units into the skin daily.    levothyroxine (SYNTHROID, LEVOTHROID) 125 MCG tablet Take 1 tablet (125 mcg total) by mouth daily before breakfast.    magnesium oxide (MAG-OX) 400 MG tablet Take 400 mg by mouth at bedtime.     metoprolol succinate (TOPROL-XL) 25 MG 24 hr tablet Take 25 mg by mouth daily.  Refills: 2    Multiple Vitamin (MULTIVITAMIN WITH MINERALS) TABS tablet Take 1 tablet by mouth daily.    tamsulosin (FLOMAX) 0.4 MG CAPS capsule Take 0.4 mg by mouth daily at 12 noon. Refills: 2     chlorhexidine (PERIDEX) 0.12 % solution 15 mLs by Mouth Rinse route 2 (two) times daily. Qty: 120 mL, Refills: 0      STOP taking these medications     glipiZIDE (GLUCOTROL) 10 MG tablet      lisinopril (PRINIVIL,ZESTRIL) 20 MG tablet      Melatonin 10 MG TABS      PRESCRIPTION MEDICATION      ipratropium-albuterol (DUONEB) 0.5-2.5 (3) MG/3ML SOLN        Allergies  Allergen Reactions  . Prednisone     REACTION: Reaction not known      The results of significant diagnostics from this hospitalization (including imaging, microbiology, ancillary and laboratory) are listed below for reference.    Significant Diagnostic Studies: Dg Chest 2 View  Result Date: 03/03/2016 CLINICAL DATA:  Initial evaluation for acute cough. Diminished breath sounds bilaterally. EXAM: CHEST  2 VIEW COMPARISON:  Prior  radiograph from 10/01/2015. FINDINGS: Cardiac and mediastinal silhouettes are stable in size and contour, and remain within normal limits. Aortic atherosclerosis noted. Lung volumes are mildly increased bilaterally with attenuation of the pulmonary markings, likely related underlying emphysema. No focal infiltrate, pulmonary edema, or pleural effusion. Mild scarring noted within the lingula, similar to prior. No pneumothorax. No acute osseous abnormality. IMPRESSION: 1. No active cardiopulmonary disease. 2. Emphysema. 3. Aortic atherosclerosis. Electronically Signed   By: Jeannine Boga M.D.   On: 03/03/2016 17:54   Dg Pelvis 1-2 Views  Result Date: 03/04/2016 CLINICAL DATA:  Fall. EXAM: PELVIS - 1-2 VIEW COMPARISON:  No prior. FINDINGS: Degenerative changes lumbar spine and both hips. No acute bony or joint abnormality identified. No evidence of fracture or dislocation. IMPRESSION: Degenerative changes lumbar spine and both hips. No acute abnormality . Electronically Signed   By: Marcello Moores  Register   On: 03/04/2016 07:14   Dg Knee 1-2 Views Left  Result Date: 03/04/2016 CLINICAL DATA:   Pain swelling. EXAM: LEFT KNEE - 1-2 VIEW COMPARISON:  No recent prior . FINDINGS: Tricompartment degenerative change. Chondrocalcinosis. No acute bony abnormality identified. Small knee joint effusion. Peripheral vascular calcification . IMPRESSION: 1. Tricompartment degenerative change with chondrocalcinosis. No acute bony abnormality. 2.  Small knee joint effusion. 3. Peripheral vascular disease. Electronically Signed   By: Marcello Moores  Register   On: 03/04/2016 07:22   Ct Head Wo Contrast  Result Date: 03/03/2016 CLINICAL DATA:  Unwitnessed fall to concrete. EXAM: CT HEAD WITHOUT CONTRAST CT CERVICAL SPINE WITHOUT CONTRAST TECHNIQUE: Multidetector CT imaging of the head and cervical spine was performed following the standard protocol without intravenous contrast. Multiplanar CT image reconstructions of the cervical spine were also generated. COMPARISON:  02/24/2015 FINDINGS: CT HEAD FINDINGS There is no intracranial hemorrhage, mass or evidence of acute infarction. There is mild generalized atrophy. There is mild chronic microvascular ischemic change. There is no significant extra-axial fluid collection.No acute intracranial findings are evident. The calvarium and skullbase are intact. Visible paranasal sinuses and orbits are unremarkable. CT CERVICAL SPINE FINDINGS There is anterior interbody fusion with anterior plate screw fixation hardware at C3-4. The interbody fusion is solidly ossified. The vertebral column, pedicles and facet articulations are intact. There is no evidence of acute fracture. No acute soft tissue abnormalities are evident. Moderate cervical facet arthritis is present, particularly from C3 through C6. No bone lesion or bony destruction. IMPRESSION: 1. No acute intracranial findings. There is moderate generalized atrophy and chronic appearing white matter hypodensities which likely represent small vessel ischemic disease. 2. Negative for acute cervical spine fracture. Solidly ossified C3-4  interbody fusion with anterior fixation hardware. Electronically Signed   By: Andreas Newport M.D.   On: 03/03/2016 22:47   Ct Cervical Spine Wo Contrast  Result Date: 03/03/2016 CLINICAL DATA:  Unwitnessed fall to concrete. EXAM: CT HEAD WITHOUT CONTRAST CT CERVICAL SPINE WITHOUT CONTRAST TECHNIQUE: Multidetector CT imaging of the head and cervical spine was performed following the standard protocol without intravenous contrast. Multiplanar CT image reconstructions of the cervical spine were also generated. COMPARISON:  02/24/2015 FINDINGS: CT HEAD FINDINGS There is no intracranial hemorrhage, mass or evidence of acute infarction. There is mild generalized atrophy. There is mild chronic microvascular ischemic change. There is no significant extra-axial fluid collection.No acute intracranial findings are evident. The calvarium and skullbase are intact. Visible paranasal sinuses and orbits are unremarkable. CT CERVICAL SPINE FINDINGS There is anterior interbody fusion with anterior plate screw fixation hardware at C3-4. The interbody fusion is  solidly ossified. The vertebral column, pedicles and facet articulations are intact. There is no evidence of acute fracture. No acute soft tissue abnormalities are evident. Moderate cervical facet arthritis is present, particularly from C3 through C6. No bone lesion or bony destruction. IMPRESSION: 1. No acute intracranial findings. There is moderate generalized atrophy and chronic appearing white matter hypodensities which likely represent small vessel ischemic disease. 2. Negative for acute cervical spine fracture. Solidly ossified C3-4 interbody fusion with anterior fixation hardware. Electronically Signed   By: Andreas Newport M.D.   On: 03/03/2016 22:47   Mr Brain Wo Contrast  Result Date: 03/04/2016 CLINICAL DATA:  New onset confusion EXAM: MRI HEAD WITHOUT CONTRAST TECHNIQUE: Multiplanar, multiecho pulse sequences of the brain and surrounding structures were  obtained without intravenous contrast. COMPARISON:  CT head 03/03/2016.  MRI 01/20/2015 FINDINGS: Ventricle size normal.  Mild atrophy, consistent with age. Negative for acute infarct. Small white matter hyperintensities bilaterally compatible with chronic microvascular ischemia, as expected for age. Brainstem and cerebellum normal. Basal ganglia normal. Negative for intracranial hemorrhage.  Negative for mass or edema. Mild mucosal edema in the paranasal sinuses. Normal orbital structures. Pituitary not enlarged. IMPRESSION: Age-appropriate atrophy.  No acute abnormality Image quality degraded by motion. Electronically Signed   By: Franchot Gallo M.D.   On: 03/04/2016 11:22    Microbiology: Recent Results (from the past 240 hour(s))  Culture, blood (Routine X 2)     Status: None (Preliminary result)   Collection Time: 03/03/16  5:35 PM  Result Value Ref Range Status   Specimen Description BLOOD RIGHT ANTECUBITAL  Final   Special Requests BOTTLES DRAWN AEROBIC AND ANAEROBIC 5CC  Final   Culture NO GROWTH < 24 HOURS  Final   Report Status PENDING  Incomplete  Urine culture     Status: Abnormal   Collection Time: 03/03/16  9:10 PM  Result Value Ref Range Status   Specimen Description URINE, RANDOM  Final   Special Requests NONE  Final   Culture <10,000 COLONIES/mL INSIGNIFICANT GROWTH (A)  Final   Report Status 03/05/2016 FINAL  Final  Culture, blood (Routine X 2)     Status: None (Preliminary result)   Collection Time: 03/03/16 11:15 PM  Result Value Ref Range Status   Specimen Description BLOOD LEFT ARM  Final   Special Requests BOTTLES DRAWN AEROBIC AND ANAEROBIC 6ML  Final   Culture NO GROWTH < 12 HOURS  Final   Report Status PENDING  Incomplete     Labs: Basic Metabolic Panel:  Recent Labs Lab 03/03/16 1730 03/04/16 0404 03/05/16 0502  NA 132* 134* 137  K 3.3* 2.9* 3.6  CL 93* 94* 101  CO2 29 31 27   GLUCOSE 375* 258* 154*  BUN 39* 36* 28*  CREATININE 2.73* 2.36* 2.07*   CALCIUM 11.2* 10.6* 10.2   Liver Function Tests:  Recent Labs Lab 03/03/16 1730 03/04/16 0404  AST 33 27  ALT 22 20  ALKPHOS 113 101  BILITOT 0.4 0.6  PROT 6.8 6.3*  ALBUMIN 4.0 3.5   No results for input(s): LIPASE, AMYLASE in the last 168 hours.  Recent Labs Lab 03/04/16 0404  AMMONIA 12   CBC:  Recent Labs Lab 03/03/16 1730 03/04/16 0404  WBC 12.5* 8.7  NEUTROABS 10.4*  --   HGB 11.7* 10.8*  HCT 37.9* 35.4*  MCV 92.4 91.2  PLT 239 188   Cardiac Enzymes:  Recent Labs Lab 03/04/16 0404  CKTOTAL 390   BNP: BNP (last 3 results) No  results for input(s): BNP in the last 8760 hours.  ProBNP (last 3 results) No results for input(s): PROBNP in the last 8760 hours.  CBG:  Recent Labs Lab 03/04/16 1227 03/04/16 1606 03/04/16 2032 03/05/16 0745 03/05/16 1211  GLUCAP 282* 230* 169* 177* 172*       Signed:  Kelvin Cellar MD.  [facility] 03/05/2016, 1:14 PM

## 2016-03-05 NOTE — Progress Notes (Signed)
Patient will discharge to [facility] Anticipated discharge date:8/30 Family notified: pt dtr Doctor, general practice by [employer]- scheduled for 2:15pm  CSW signing off.  Jorge Ny, [place] Social Worker [phone]

## 2016-03-05 NOTE — Progress Notes (Signed)
Attempted to call report on pt going to [place] facility, spoke with staff member, Lenna Sciara. She took my number and will call back.

## 2016-03-05 NOTE — Progress Notes (Signed)
@   1458   Report completed to nurse Lenna Sciara at [facility]. Pt has already departed from unit with [facility] transportation, belongings sent with pt.

## 2016-03-05 NOTE — Progress Notes (Signed)
Physical Therapy Treatment Patient Details Name: Harold Martin MRN: [id number] DOB: 11-Aug-1940 Today's Date: 03/05/2016    History of Present Illness 75 y.o. male admitted for AMS and a mechanical fall at home. PMH significant for HTN, HLD, COPD, dyspnea on exertion, DM type II, CKD stage 3, bladder carcinoma, and swallowing difficulty.     PT Comments    Patient progressing slowly towards PT goals. Continues to be limited by confusion and lethargy. Pt has a hard time staying awake during session without being stimulated. Tolerated gait training with Min A due to scissoring, instability and staggering. Requires assist with RW management. Pt continually stating, "weird," throughout session. Distance limited due to above and safety concerns. Appropriate for short term SNF.    Follow Up Recommendations  SNF;Supervision/Assistance - 24 hour     Equipment Recommendations  None recommended by PT    Recommendations for Other Services       Precautions / Restrictions Precautions Precautions: Fall Restrictions Weight Bearing Restrictions: No    Mobility  Bed Mobility Overal bed mobility: Needs Assistance Bed Mobility: Sit to Supine       Sit to supine: Min guard   General bed mobility comments: HOB flat, no use of rails to return to supine.   Transfers Overall transfer level: Needs assistance Equipment used: Rolling walker (2 wheeled) Transfers: Sit to/from Stand Sit to Stand: Min assist         General transfer comment: Assist to power up to standing using momentum. Stood from chair x1, from [employer]. Cues to place hands on RW.  Ambulation/Gait Ambulation/Gait assistance: Min assist Ambulation Distance (Feet): 18 Feet Assistive device: Rolling walker (2 wheeled) Gait Pattern/deviations: Step-through pattern;Narrow base of support;Trunk flexed;Staggering right;Staggering left;Scissoring Gait velocity: decreased   General Gait Details: Slow, unsteady gait with assist  for RW management, scissoring gait noted as well as staggering. Constant assist to maintain upright, high fall risk.   Stairs            Wheelchair Mobility    Modified Rankin (Stroke Patients Only)       Balance Overall balance assessment: Needs assistance Sitting-balance support: Feet supported;No upper extremity supported Sitting balance-Leahy Scale: Good     Standing balance support: During functional activity Standing balance-Leahy Scale: Poor Standing balance comment: Requires min A for standing to use urinal; very lethargic, falling asleep.                     Cognition Arousal/Alertness: Lethargic Behavior During Therapy: Flat affect Overall Cognitive Status: Impaired/Different from baseline Area of Impairment: Orientation;Attention;Safety/judgement;Awareness Orientation Level: Disoriented to;Place;Time;Situation (Able to state name and bday) Current Attention Level: Focused     Safety/Judgement: Decreased awareness of deficits;Decreased awareness of safety Awareness: Intellectual Problem Solving: Slow processing;Decreased initiation;Requires tactile cues;Requires verbal cues;Difficulty sequencing General Comments: Easily falls back asleep when not stimulated. Requires repetition of cues to perform task. Mumbling speech.    Exercises      General Comments        Pertinent Vitals/Pain Pain Assessment: Faces Faces Pain Scale: Hurts little more Pain Location: LEs when lowering recliner Pain Descriptors / Indicators: Grimacing;Sore Pain Intervention(s): Monitored during session;Repositioned    Home Living                      Prior Function            PT Goals (current goals can now be found in the care plan section) Progress towards PT  goals: Progressing toward goals    Frequency  Min 3X/week    PT Plan Current plan remains appropriate    Co-evaluation             End of Session Equipment Utilized During Treatment:  Gait belt Activity Tolerance: Patient tolerated treatment well Patient left: in bed;with call bell/phone within reach;with bed alarm set     Time: 1100-1116 PT Time Calculation (min) (ACUTE ONLY): 16 min  Charges:  $Gait Training: 8-22 mins                    G Codes:      Harold Martin A Harold Martin 03/05/2016, 11:43 AM Harold Martin, Harold Martin, DPT [phone]

## 2016-03-05 NOTE — Clinical Social Work Note (Signed)
Clinical Social Work Assessment  Patient Details  Name: Harold Martin MRN: [id number] Date of Birth: 1941/02/22  Date of referral:  03/05/16               Reason for consult:  Facility Placement                Permission sought to share information with:  Chartered certified accountant granted to share information::  No (pt disoriented)  Name::     Nutritional therapist::  [facility]  Relationship::  dtr  Contact Information:     Housing/Transportation Living arrangements for the past 2 months:  [place] of Information:  Adult Children Patient Interpreter Needed:  None Criminal Activity/Legal Involvement Pertinent to Current Situation/Hospitalization:  No - Comment as needed Significant Relationships:  Adult Children, Spouse Lives with:  Self Do you feel safe going back to the place where you live?  No Need for family participation in patient care:  Yes (Comment) (help with decision making )  Care giving concerns:  Pt lives alone at home and currently with significant new impairments physically and mentally- dtr does not feel as if he would be safe to return home alone at this time and she works so is unable to be home with the pt.   Social Worker assessment / plan:  CSW spoke with pt dtr concerning PT recommendation for SNF- pt dtr very familiar with the process- states that pt wife lives at [facility] and [place] and that the patient has been there for rehab multiple times.  Employment status:  Retired Nurse, adult PT Recommendations:  [place] / Referral to community resources:  [place]  Patient/Family's Response to care:  Pt dtr agreeable to pt placement in SNF while he recovers  Patient/Family's Understanding of and Emotional Response to Diagnosis, Current Treatment, and Prognosis:  Pt dtr very realistic about patient needs and agreeable to whatever will  aid his recovery- hopeful he will recover after a short SNF stay.  Emotional Assessment Appearance:  Appears stated age Attitude/Demeanor/Rapport:  Sedated Affect (typically observed):  Quiet Orientation:  Oriented to Place, Oriented to Self Alcohol / Substance use:  Not Applicable Psych involvement (Current and /or in the community):  No (Comment)  Discharge Needs  Concerns to be addressed:  Care Coordination Readmission within the last 30 days:  No Current discharge risk:  Physical Impairment Barriers to Discharge:  Continued Medical Work up   [employer], LCSW 03/05/2016, 8:21 AM

## 2016-03-08 LAB — CULTURE, BLOOD (ROUTINE X 2): CULTURE: NO GROWTH

## 2016-03-09 LAB — CULTURE, BLOOD (ROUTINE X 2): CULTURE: NO GROWTH

## 2016-07-08 DIAGNOSIS — J449 Chronic obstructive pulmonary disease, unspecified: Secondary | ICD-10-CM | POA: Diagnosis not present

## 2016-07-16 DIAGNOSIS — R05 Cough: Secondary | ICD-10-CM | POA: Diagnosis not present

## 2016-07-16 DIAGNOSIS — G4733 Obstructive sleep apnea (adult) (pediatric): Secondary | ICD-10-CM | POA: Diagnosis not present

## 2016-07-16 DIAGNOSIS — E118 Type 2 diabetes mellitus with unspecified complications: Secondary | ICD-10-CM | POA: Diagnosis not present

## 2016-08-08 DIAGNOSIS — J449 Chronic obstructive pulmonary disease, unspecified: Secondary | ICD-10-CM | POA: Diagnosis not present

## 2016-09-05 DIAGNOSIS — J449 Chronic obstructive pulmonary disease, unspecified: Secondary | ICD-10-CM | POA: Diagnosis not present

## 2016-09-12 DIAGNOSIS — J209 Acute bronchitis, unspecified: Secondary | ICD-10-CM | POA: Diagnosis not present

## 2016-10-06 DIAGNOSIS — J449 Chronic obstructive pulmonary disease, unspecified: Secondary | ICD-10-CM | POA: Diagnosis not present

## 2016-10-13 DIAGNOSIS — I10 Essential (primary) hypertension: Secondary | ICD-10-CM | POA: Diagnosis not present

## 2016-10-13 DIAGNOSIS — Z125 Encounter for screening for malignant neoplasm of prostate: Secondary | ICD-10-CM | POA: Diagnosis not present

## 2016-10-13 DIAGNOSIS — E118 Type 2 diabetes mellitus with unspecified complications: Secondary | ICD-10-CM | POA: Diagnosis not present

## 2016-10-13 DIAGNOSIS — E789 Disorder of lipoprotein metabolism, unspecified: Secondary | ICD-10-CM | POA: Diagnosis not present

## 2016-10-21 DIAGNOSIS — J42 Unspecified chronic bronchitis: Secondary | ICD-10-CM | POA: Diagnosis not present

## 2016-10-21 DIAGNOSIS — R0602 Shortness of breath: Secondary | ICD-10-CM | POA: Diagnosis not present

## 2016-10-21 DIAGNOSIS — E118 Type 2 diabetes mellitus with unspecified complications: Secondary | ICD-10-CM | POA: Diagnosis not present

## 2016-11-05 DIAGNOSIS — J449 Chronic obstructive pulmonary disease, unspecified: Secondary | ICD-10-CM | POA: Diagnosis not present

## 2016-12-06 DIAGNOSIS — J449 Chronic obstructive pulmonary disease, unspecified: Secondary | ICD-10-CM | POA: Diagnosis not present

## 2016-12-23 DIAGNOSIS — L309 Dermatitis, unspecified: Secondary | ICD-10-CM | POA: Diagnosis not present

## 2016-12-23 DIAGNOSIS — L0390 Cellulitis, unspecified: Secondary | ICD-10-CM | POA: Diagnosis not present

## 2016-12-23 DIAGNOSIS — R05 Cough: Secondary | ICD-10-CM | POA: Diagnosis not present

## 2017-01-05 DIAGNOSIS — J449 Chronic obstructive pulmonary disease, unspecified: Secondary | ICD-10-CM | POA: Diagnosis not present

## 2017-02-05 DIAGNOSIS — J449 Chronic obstructive pulmonary disease, unspecified: Secondary | ICD-10-CM | POA: Diagnosis not present

## 2018-07-16 ENCOUNTER — Inpatient Hospital Stay (HOSPITAL_COMMUNITY)
Admission: EM | Admit: 2018-07-16 | Discharge: 2018-07-29 | DRG: 080 | Disposition: A | Discharge status: Skilled Nursing Facility | Payer: Medicare Other | Attending: Internal Medicine | Admitting: Internal Medicine

## 2018-07-16 ENCOUNTER — Inpatient Hospital Stay (HOSPITAL_COMMUNITY): Payer: Medicare Other

## 2018-07-16 ENCOUNTER — Emergency Department (HOSPITAL_COMMUNITY): Payer: Medicare Other

## 2018-07-16 ENCOUNTER — Other Ambulatory Visit: Payer: Self-pay

## 2018-07-16 DIAGNOSIS — J439 Emphysema, unspecified: Secondary | ICD-10-CM | POA: Diagnosis present

## 2018-07-16 DIAGNOSIS — E039 Hypothyroidism, unspecified: Secondary | ICD-10-CM | POA: Diagnosis present

## 2018-07-16 DIAGNOSIS — E1165 Type 2 diabetes mellitus with hyperglycemia: Secondary | ICD-10-CM | POA: Diagnosis present

## 2018-07-16 DIAGNOSIS — Z794 Long term (current) use of insulin: Secondary | ICD-10-CM

## 2018-07-16 DIAGNOSIS — M1710 Unilateral primary osteoarthritis, unspecified knee: Secondary | ICD-10-CM | POA: Diagnosis present

## 2018-07-16 DIAGNOSIS — G9340 Encephalopathy, unspecified: Secondary | ICD-10-CM | POA: Diagnosis present

## 2018-07-16 DIAGNOSIS — E1122 Type 2 diabetes mellitus with diabetic chronic kidney disease: Secondary | ICD-10-CM | POA: Diagnosis present

## 2018-07-16 DIAGNOSIS — N183 Chronic kidney disease, stage 3 (moderate): Secondary | ICD-10-CM | POA: Diagnosis present

## 2018-07-16 DIAGNOSIS — E785 Hyperlipidemia, unspecified: Secondary | ICD-10-CM | POA: Diagnosis present

## 2018-07-16 DIAGNOSIS — R0600 Dyspnea, unspecified: Secondary | ICD-10-CM

## 2018-07-16 DIAGNOSIS — Z8673 Personal history of transient ischemic attack (TIA), and cerebral infarction without residual deficits: Secondary | ICD-10-CM | POA: Diagnosis not present

## 2018-07-16 DIAGNOSIS — Z9981 Dependence on supplemental oxygen: Secondary | ICD-10-CM

## 2018-07-16 DIAGNOSIS — J9602 Acute respiratory failure with hypercapnia: Secondary | ICD-10-CM

## 2018-07-16 DIAGNOSIS — Z8249 Family history of ischemic heart disease and other diseases of the circulatory system: Secondary | ICD-10-CM

## 2018-07-16 DIAGNOSIS — N184 Chronic kidney disease, stage 4 (severe): Secondary | ICD-10-CM | POA: Diagnosis present

## 2018-07-16 DIAGNOSIS — M6282 Rhabdomyolysis: Secondary | ICD-10-CM | POA: Diagnosis present

## 2018-07-16 DIAGNOSIS — L89156 Pressure-induced deep tissue damage of sacral region: Secondary | ICD-10-CM | POA: Diagnosis present

## 2018-07-16 DIAGNOSIS — E860 Dehydration: Secondary | ICD-10-CM | POA: Diagnosis present

## 2018-07-16 DIAGNOSIS — I129 Hypertensive chronic kidney disease with stage 1 through stage 4 chronic kidney disease, or unspecified chronic kidney disease: Secondary | ICD-10-CM | POA: Diagnosis present

## 2018-07-16 DIAGNOSIS — J9601 Acute respiratory failure with hypoxia: Secondary | ICD-10-CM | POA: Diagnosis present

## 2018-07-16 DIAGNOSIS — R4182 Altered mental status, unspecified: Secondary | ICD-10-CM

## 2018-07-16 DIAGNOSIS — N179 Acute kidney failure, unspecified: Secondary | ICD-10-CM

## 2018-07-16 DIAGNOSIS — Z888 Allergy status to other drugs, medicaments and biological substances status: Secondary | ICD-10-CM

## 2018-07-16 DIAGNOSIS — R410 Disorientation, unspecified: Secondary | ICD-10-CM | POA: Diagnosis not present

## 2018-07-16 DIAGNOSIS — Z9114 Patient's other noncompliance with medication regimen: Secondary | ICD-10-CM | POA: Diagnosis not present

## 2018-07-16 DIAGNOSIS — E035 Myxedema coma: Secondary | ICD-10-CM | POA: Diagnosis present

## 2018-07-16 DIAGNOSIS — G9341 Metabolic encephalopathy: Secondary | ICD-10-CM | POA: Diagnosis present

## 2018-07-16 DIAGNOSIS — F17220 Nicotine dependence, chewing tobacco, uncomplicated: Secondary | ICD-10-CM | POA: Diagnosis present

## 2018-07-16 DIAGNOSIS — M25062 Hemarthrosis, left knee: Secondary | ICD-10-CM | POA: Diagnosis present

## 2018-07-16 DIAGNOSIS — R0989 Other specified symptoms and signs involving the circulatory and respiratory systems: Secondary | ICD-10-CM

## 2018-07-16 DIAGNOSIS — Z8551 Personal history of malignant neoplasm of bladder: Secondary | ICD-10-CM

## 2018-07-16 DIAGNOSIS — N400 Enlarged prostate without lower urinary tract symptoms: Secondary | ICD-10-CM | POA: Diagnosis present

## 2018-07-16 DIAGNOSIS — R471 Dysarthria and anarthria: Secondary | ICD-10-CM | POA: Diagnosis present

## 2018-07-16 DIAGNOSIS — E870 Hyperosmolality and hypernatremia: Secondary | ICD-10-CM | POA: Diagnosis present

## 2018-07-16 DIAGNOSIS — I10 Essential (primary) hypertension: Secondary | ICD-10-CM | POA: Diagnosis not present

## 2018-07-16 DIAGNOSIS — Z7982 Long term (current) use of aspirin: Secondary | ICD-10-CM

## 2018-07-16 DIAGNOSIS — R0609 Other forms of dyspnea: Secondary | ICD-10-CM | POA: Diagnosis not present

## 2018-07-16 DIAGNOSIS — R059 Cough, unspecified: Secondary | ICD-10-CM

## 2018-07-16 DIAGNOSIS — E871 Hypo-osmolality and hyponatremia: Secondary | ICD-10-CM | POA: Diagnosis present

## 2018-07-16 DIAGNOSIS — W19XXXA Unspecified fall, initial encounter: Secondary | ICD-10-CM | POA: Diagnosis present

## 2018-07-16 DIAGNOSIS — Z79899 Other long term (current) drug therapy: Secondary | ICD-10-CM

## 2018-07-16 DIAGNOSIS — J42 Unspecified chronic bronchitis: Secondary | ICD-10-CM

## 2018-07-16 DIAGNOSIS — J438 Other emphysema: Secondary | ICD-10-CM | POA: Diagnosis present

## 2018-07-16 DIAGNOSIS — Z79890 Hormone replacement therapy (postmenopausal): Secondary | ICD-10-CM

## 2018-07-16 DIAGNOSIS — S0081XA Abrasion of other part of head, initial encounter: Secondary | ICD-10-CM | POA: Diagnosis present

## 2018-07-16 DIAGNOSIS — L8990 Pressure ulcer of unspecified site, unspecified stage: Secondary | ICD-10-CM

## 2018-07-16 DIAGNOSIS — J449 Chronic obstructive pulmonary disease, unspecified: Secondary | ICD-10-CM | POA: Diagnosis present

## 2018-07-16 DIAGNOSIS — R05 Cough: Secondary | ICD-10-CM

## 2018-07-16 LAB — URINALYSIS, ROUTINE W REFLEX MICROSCOPIC
Bilirubin Urine: NEGATIVE
Glucose, UA: NEGATIVE mg/dL
Ketones, ur: NEGATIVE mg/dL
Leukocytes, UA: NEGATIVE
Nitrite: NEGATIVE
Protein, ur: NEGATIVE mg/dL
Specific Gravity, Urine: 1.021 (ref 1.005–1.030)
pH: 5 (ref 5.0–8.0)

## 2018-07-16 LAB — I-STAT ARTERIAL BLOOD GAS, ED
ACID-BASE EXCESS: 8 mmol/L — AB (ref 0.0–2.0)
Bicarbonate: 34.9 mmol/L — ABNORMAL HIGH (ref 20.0–28.0)
O2 SAT: 100 %
Patient temperature: 98.6
TCO2: 37 mmol/L — ABNORMAL HIGH (ref 22–32)
pCO2 arterial: 55.8 mmHg — ABNORMAL HIGH (ref 32.0–48.0)
pH, Arterial: 7.404 (ref 7.350–7.450)
pO2, Arterial: 176 mmHg — ABNORMAL HIGH (ref 83.0–108.0)

## 2018-07-16 LAB — I-STAT VENOUS BLOOD GAS, ED
Acid-Base Excess: 6 mmol/L — ABNORMAL HIGH (ref 0.0–2.0)
Bicarbonate: 35.8 mmol/L — ABNORMAL HIGH (ref 20.0–28.0)
O2 Saturation: 38 %
PO2 VEN: 25 mmHg — AB (ref 32.0–45.0)
TCO2: 38 mmol/L — ABNORMAL HIGH (ref 22–32)
pCO2, Ven: 70.6 mmHg (ref 44.0–60.0)
pH, Ven: 7.313 (ref 7.250–7.430)

## 2018-07-16 LAB — I-STAT CHEM 8, ED
BUN: 38 mg/dL — ABNORMAL HIGH (ref 8–23)
CALCIUM ION: 1.01 mmol/L — AB (ref 1.15–1.40)
Chloride: 98 mmol/L (ref 98–111)
Creatinine, Ser: 3.8 mg/dL — ABNORMAL HIGH (ref 0.61–1.24)
GLUCOSE: 180 mg/dL — AB (ref 70–99)
HCT: 42 % (ref 39.0–52.0)
Hemoglobin: 14.3 g/dL (ref 13.0–17.0)
Potassium: 4.2 mmol/L (ref 3.5–5.1)
Sodium: 139 mmol/L (ref 135–145)
TCO2: 34 mmol/L — ABNORMAL HIGH (ref 22–32)

## 2018-07-16 LAB — CBC WITH DIFFERENTIAL/PLATELET
Abs Immature Granulocytes: 0.08 10*3/uL — ABNORMAL HIGH (ref 0.00–0.07)
BASOS ABS: 0.1 10*3/uL (ref 0.0–0.1)
Basophils Relative: 0 %
Eosinophils Absolute: 0.1 10*3/uL (ref 0.0–0.5)
Eosinophils Relative: 1 %
HCT: 44.1 % (ref 39.0–52.0)
Hemoglobin: 13.6 g/dL (ref 13.0–17.0)
Immature Granulocytes: 1 %
LYMPHS ABS: 0.5 10*3/uL — AB (ref 0.7–4.0)
Lymphocytes Relative: 4 %
MCH: 30.9 pg (ref 26.0–34.0)
MCHC: 30.8 g/dL (ref 30.0–36.0)
MCV: 100.2 fL — ABNORMAL HIGH (ref 80.0–100.0)
Monocytes Absolute: 1 10*3/uL (ref 0.1–1.0)
Monocytes Relative: 9 %
NRBC: 0 % (ref 0.0–0.2)
Neutro Abs: 9.5 10*3/uL — ABNORMAL HIGH (ref 1.7–7.7)
Neutrophils Relative %: 85 %
Platelets: 185 10*3/uL (ref 150–400)
RBC: 4.4 MIL/uL (ref 4.22–5.81)
RDW: 13.2 % (ref 11.5–15.5)
WBC: 11.2 10*3/uL — ABNORMAL HIGH (ref 4.0–10.5)

## 2018-07-16 LAB — CBC
HCT: 41.3 % (ref 39.0–52.0)
Hemoglobin: 12.9 g/dL — ABNORMAL LOW (ref 13.0–17.0)
MCH: 31 pg (ref 26.0–34.0)
MCHC: 31.2 g/dL (ref 30.0–36.0)
MCV: 99.3 fL (ref 80.0–100.0)
Platelets: 182 10*3/uL (ref 150–400)
RBC: 4.16 MIL/uL — ABNORMAL LOW (ref 4.22–5.81)
RDW: 13.4 % (ref 11.5–15.5)
WBC: 12.3 10*3/uL — ABNORMAL HIGH (ref 4.0–10.5)
nRBC: 0 % (ref 0.0–0.2)

## 2018-07-16 LAB — COMPREHENSIVE METABOLIC PANEL
ALBUMIN: 4.2 g/dL (ref 3.5–5.0)
ALT: 61 U/L — AB (ref 0–44)
AST: 187 U/L — ABNORMAL HIGH (ref 15–41)
Alkaline Phosphatase: 101 U/L (ref 38–126)
Anion gap: 13 (ref 5–15)
BUN: 33 mg/dL — ABNORMAL HIGH (ref 8–23)
CO2: 30 mmol/L (ref 22–32)
CREATININE: 3.85 mg/dL — AB (ref 0.61–1.24)
Calcium: 8.7 mg/dL — ABNORMAL LOW (ref 8.9–10.3)
Chloride: 97 mmol/L — ABNORMAL LOW (ref 98–111)
GFR calc non Af Amer: 14 mL/min — ABNORMAL LOW (ref >60)
GFR, EST AFRICAN AMERICAN: 17 mL/min — AB (ref >60)
GLUCOSE: 175 mg/dL — AB (ref 70–99)
Potassium: 4.1 mmol/L (ref 3.5–5.1)
SODIUM: 140 mmol/L (ref 135–145)
Total Bilirubin: 1.3 mg/dL — ABNORMAL HIGH (ref 0.3–1.2)
Total Protein: 7.4 g/dL (ref 6.5–8.1)

## 2018-07-16 LAB — CREATININE, SERUM
Creatinine, Ser: 3.65 mg/dL — ABNORMAL HIGH (ref 0.61–1.24)
GFR calc Af Amer: 18 mL/min — ABNORMAL LOW (ref >60)
GFR calc non Af Amer: 15 mL/min — ABNORMAL LOW (ref >60)

## 2018-07-16 LAB — I-STAT CG4 LACTIC ACID, ED: LACTIC ACID, VENOUS: 1.9 mmol/L (ref 0.5–1.9)

## 2018-07-16 LAB — PROTIME-INR
INR: 1.07
INR: 0.98
Prothrombin Time: 13.8 seconds (ref 11.4–15.2)
Prothrombin Time: 12.9 seconds (ref 11.4–15.2)

## 2018-07-16 LAB — INFLUENZA PANEL BY PCR (TYPE A & B)
INFLAPCR: NEGATIVE
Influenza B By PCR: NEGATIVE

## 2018-07-16 LAB — HEMOGLOBIN A1C
Hgb A1c MFr Bld: 7.5 % — ABNORMAL HIGH (ref 4.8–5.6)
Mean Plasma Glucose: 168.55 mg/dL

## 2018-07-16 LAB — CK: Total CK: 7876 U/L — ABNORMAL HIGH (ref 49–397)

## 2018-07-16 LAB — GLUCOSE, CAPILLARY
GLUCOSE-CAPILLARY: 155 mg/dL — AB (ref 70–99)
Glucose-Capillary: 172 mg/dL — ABNORMAL HIGH (ref 70–99)

## 2018-07-16 LAB — AMMONIA: Ammonia: 23 umol/L (ref 9–35)

## 2018-07-16 LAB — I-STAT TROPONIN, ED: Troponin i, poc: 0 ng/mL (ref 0.00–0.08)

## 2018-07-16 LAB — SALICYLATE LEVEL: Salicylate Lvl: <7 mg/dL (ref 2.8–30.0)

## 2018-07-16 LAB — MRSA PCR SCREENING: MRSA by PCR: NEGATIVE

## 2018-07-16 LAB — TSH: TSH: 87.688 u[IU]/mL — ABNORMAL HIGH (ref 0.350–4.500)

## 2018-07-16 LAB — ACETAMINOPHEN LEVEL: Acetaminophen (Tylenol), Serum: <10 ug/mL — ABNORMAL LOW (ref 10–30)

## 2018-07-16 LAB — ETHANOL: Alcohol, Ethyl (B): <10 mg/dL (ref <10)

## 2018-07-16 MED ORDER — INSULIN NPH (HUMAN) (ISOPHANE) 100 UNIT/ML ~~LOC~~ SUSP
20.0000 [IU] | Freq: Every day | SUBCUTANEOUS | Status: DC
  Administered 2018-07-20 – 2018-07-29 (×9): 20 [IU] via SUBCUTANEOUS
  Filled 2018-07-16: qty 10

## 2018-07-16 MED ORDER — LACTATED RINGERS IV BOLUS
1000.0000 mL | Freq: Once | INTRAVENOUS | Status: AC
  Administered 2018-07-16: 1000 mL via INTRAVENOUS

## 2018-07-16 MED ORDER — LEVOTHYROXINE SODIUM 100 MCG/5ML IV SOLN: 75.0000 ug | Freq: Every day | INTRAVENOUS | Status: DC

## 2018-07-16 MED ORDER — HYDRALAZINE HCL 20 MG/ML IJ SOLN: 10.0000 mg | Freq: Four times a day (QID) | INTRAMUSCULAR | Status: DC | PRN

## 2018-07-16 MED ORDER — HALOPERIDOL LACTATE 5 MG/ML IJ SOLN
2.0000 mg | Freq: Four times a day (QID) | INTRAMUSCULAR | Status: DC | PRN
  Administered 2018-07-27: 2 mg via INTRAVENOUS
  Filled 2018-07-16: qty 1

## 2018-07-16 MED ORDER — INSULIN ASPART 100 UNIT/ML ~~LOC~~ SOLN
0.0000 [IU] | SUBCUTANEOUS | Status: DC
  Administered 2018-07-16: 3 [IU] via SUBCUTANEOUS
  Administered 2018-07-17: 2 [IU] via SUBCUTANEOUS
  Administered 2018-07-17 (×2): 3 [IU] via SUBCUTANEOUS
  Administered 2018-07-17 (×3): 2 [IU] via SUBCUTANEOUS
  Administered 2018-07-18: 3 [IU] via SUBCUTANEOUS
  Administered 2018-07-18: 5 [IU] via SUBCUTANEOUS
  Administered 2018-07-18 (×3): 3 [IU] via SUBCUTANEOUS
  Administered 2018-07-18: 2 [IU] via SUBCUTANEOUS
  Administered 2018-07-19 (×2): 3 [IU] via SUBCUTANEOUS
  Administered 2018-07-19: 2 [IU] via SUBCUTANEOUS
  Administered 2018-07-19 (×2): 3 [IU] via SUBCUTANEOUS
  Administered 2018-07-19 – 2018-07-20 (×2): 5 [IU] via SUBCUTANEOUS
  Administered 2018-07-20 – 2018-07-21 (×8): 3 [IU] via SUBCUTANEOUS
  Administered 2018-07-21: 5 [IU] via SUBCUTANEOUS
  Administered 2018-07-21 – 2018-07-22 (×5): 3 [IU] via SUBCUTANEOUS
  Administered 2018-07-22: 5 [IU] via SUBCUTANEOUS
  Administered 2018-07-22 – 2018-07-23 (×2): 3 [IU] via SUBCUTANEOUS
  Administered 2018-07-23: 2 [IU] via SUBCUTANEOUS
  Administered 2018-07-23 (×2): 3 [IU] via SUBCUTANEOUS
  Administered 2018-07-23: 2 [IU] via SUBCUTANEOUS
  Administered 2018-07-23: 5 [IU] via SUBCUTANEOUS
  Administered 2018-07-24 – 2018-07-25 (×4): 3 [IU] via SUBCUTANEOUS
  Administered 2018-07-25 (×4): 2 [IU] via SUBCUTANEOUS
  Administered 2018-07-26: 3 [IU] via SUBCUTANEOUS
  Administered 2018-07-26: 2 [IU] via SUBCUTANEOUS
  Administered 2018-07-26: 3 [IU] via SUBCUTANEOUS
  Administered 2018-07-26: 2 [IU] via SUBCUTANEOUS
  Administered 2018-07-26: 3 [IU] via SUBCUTANEOUS
  Administered 2018-07-27: 2 [IU] via SUBCUTANEOUS
  Administered 2018-07-27 (×2): 3 [IU] via SUBCUTANEOUS
  Administered 2018-07-27: 5 [IU] via SUBCUTANEOUS
  Administered 2018-07-27: 2 [IU] via SUBCUTANEOUS
  Administered 2018-07-27 – 2018-07-28 (×4): 3 [IU] via SUBCUTANEOUS
  Administered 2018-07-28 (×2): 2 [IU] via SUBCUTANEOUS
  Administered 2018-07-28 – 2018-07-29 (×4): 3 [IU] via SUBCUTANEOUS

## 2018-07-16 MED ORDER — ACETAMINOPHEN 325 MG PO TABS
650.0000 mg | ORAL_TABLET | Freq: Four times a day (QID) | ORAL | Status: DC | PRN
  Administered 2018-07-21 – 2018-07-23 (×3): 650 mg via ORAL
  Filled 2018-07-16 (×3): qty 2

## 2018-07-16 MED ORDER — LACTATED RINGERS IV SOLN
INTRAVENOUS | Status: DC
  Administered 2018-07-16 – 2018-07-17 (×2): via INTRAVENOUS

## 2018-07-16 MED ORDER — ASPIRIN 300 MG RE SUPP
300.0000 mg | Freq: Every day | RECTAL | Status: DC
  Administered 2018-07-16 – 2018-07-19 (×4): 300 mg via RECTAL
  Filled 2018-07-16 (×4): qty 1

## 2018-07-16 MED ORDER — ORAL CARE MOUTH RINSE
15.0000 mL | Freq: Two times a day (BID) | OROMUCOSAL | Status: DC
  Administered 2018-07-16 – 2018-07-29 (×18): 15 mL via OROMUCOSAL

## 2018-07-16 MED ORDER — TAMSULOSIN HCL 0.4 MG PO CAPS
0.4000 mg | ORAL_CAPSULE | Freq: Every day | ORAL | Status: DC
  Administered 2018-07-20 – 2018-07-28 (×9): 0.4 mg via ORAL
  Filled 2018-07-16 (×9): qty 1

## 2018-07-16 MED ORDER — HEPARIN SODIUM (PORCINE) 5000 UNIT/ML IJ SOLN
5000.0000 [IU] | Freq: Three times a day (TID) | INTRAMUSCULAR | Status: DC
  Administered 2018-07-16 – 2018-07-29 (×38): 5000 [IU] via SUBCUTANEOUS
  Filled 2018-07-16 (×37): qty 1

## 2018-07-16 MED ORDER — LACTATED RINGERS IV SOLN: INTRAVENOUS | Status: DC

## 2018-07-16 MED ORDER — ACETAMINOPHEN 650 MG RE SUPP: 650.0000 mg | Freq: Four times a day (QID) | RECTAL | Status: DC | PRN

## 2018-07-16 MED ORDER — LEVOFLOXACIN IN D5W 500 MG/100ML IV SOLN
500.0000 mg | Freq: Once | INTRAVENOUS | Status: AC
  Administered 2018-07-16: 500 mg via INTRAVENOUS
  Filled 2018-07-16: qty 100

## 2018-07-16 MED ORDER — BISACODYL 5 MG PO TBEC
5.0000 mg | DELAYED_RELEASE_TABLET | Freq: Every day | ORAL | Status: DC | PRN
  Administered 2018-07-22 – 2018-07-23 (×2): 5 mg via ORAL
  Filled 2018-07-16 (×2): qty 1

## 2018-07-16 MED ORDER — LEVOTHYROXINE SODIUM 100 MCG/5ML IV SOLN
62.5000 ug | Freq: Every day | INTRAVENOUS | Status: DC
  Administered 2018-07-17: 62.5 ug via INTRAVENOUS
  Filled 2018-07-16: qty 5

## 2018-07-16 NOTE — ED Triage Notes (Signed)
Pt BIB by [facility]. Pt was last seen by family on Tuesday. Pt was found on the floor by his daughter today. Pt's daughter states patient was sick last week. Pt is disoriented for EMS. Pt normally wears oxygen at night. Pt has bruising to knees bilaterally, left arm, and left upper abdominal quadrant.

## 2018-07-16 NOTE — Progress Notes (Signed)
Patient has returned to unit. HOB ordered to be 75 degrees, but 64 degrees is the highest possible. No distress noted. Will continue to monitor.

## 2018-07-16 NOTE — ED Notes (Signed)
This RN removed patient from bipap and placed him on 2L [place]. SpO2 99% on 2L

## 2018-07-16 NOTE — H&P (Addendum)
TRH H&P   Patient Demographics:    Harold Martin, is a 78 y.o. male  MRN: [id number]   DOB - 1940-11-03  Admit Date - 07/16/2018  Outpatient Primary MD for the patient is Anda Kraft, MD    Patient coming from: Home  No chief complaint on file.     HPI:    Harold Martin  is a 78 y.o. male, severe COPD, hypothyroidism, insulin-dependent type 2 diabetes mellitus who lives at home alone, still walks and ambulates by himself and drives a car, who had been suffering with mild cold for the last few days, was talking to his daughter on a daily basis last conversation 2 days ago, this afternoon when the daughter tried to get in touch with the patient she did not get any response, when she went to visit him which he was found down on the floor unresponsive.  He was brought to the ER where he was found to be encephalopathic, he had signs of some soft tissue injury to both knees with mild abrasions, he was hypoxic with elevated CO2 on VBG, CT head was nonacute, he also had severe dehydration with rhabdomyolysis and ARF.  I was requested to admit the patient.  Patient is unable to provide any history as he is obtunded.  I called and got the information from the daughter and the ER physician.    Review of systems:    Patient obtunded unable to provide history.   With Past History of the following :    Past Medical History:  Diagnosis Date  . COPD (chronic obstructive pulmonary disease) ([employer])   . Diabetes mellitus without complication ([place])   . Hypertension   . Hypothyroidism   . Shortness of breath   . TIA (transient ischemic attack)       Past Surgical History:  Procedure Laterality Date  . COLONOSCOPY N/A  09/23/2013   Procedure: COLONOSCOPY;  Surgeon: Beryle Beams, MD;  Location: [facility] END[facility]COPY;  Service: Endoscopy;  Laterality: N/A;  . ESOPHAGOGASTRODUODENOSCOPY N/A 09/23/2013   Procedure: ESOPHAGOGASTRODUODENOSCOPY (EGD);  Surgeon: Beryle Beams, MD;  Location: [facility];  Service: Endoscopy;  Laterality: N/A;  . GIVENS CAPSULE STUDY N/A 10/07/2013   Procedure: GIVENS CAPSULE STUDY;  Surgeon: Beryle Beams, MD;  Location: [facility]COPY;  Service: Endoscopy;  Laterality: N/A;  .  surgery for bladder cancer  yrs ago      Social History:     Social History   Tobacco Use  . Smoking status: Never Smoker  . Smokeless tobacco: Current User    Types: Chew  Substance Use Topics  . Alcohol use: No         Family History :     Family History  Problem Relation Age of Onset  . Hypertension Other        Home Medications:   Prior to Admission medications   Medication Sig Start Date End Date Taking? Authorizing Provider  aspirin (ASPIRIN CHILDRENS) 81 MG chewable tablet Chew 1 tablet (81 mg total) by mouth daily. 02/27/15  Yes Delfina Redwood, MD  glipiZIDE (GLUCOTROL) 10 MG tablet Take 10 mg by mouth daily.   Yes [provider]  insulin NPH Human (HUMULIN N,NOVOLIN N) 100 UNIT/ML injection Inject 40 Units into the skin daily.    Yes [provider]  levothyroxine (SYNTHROID, LEVOTHROID) 125 MCG tablet Take 1 tablet (125 mcg total) by mouth daily before breakfast. 02/27/15  Yes Delfina Redwood, MD  magnesium oxide (MAG-OX) 400 MG tablet Take 400 mg by mouth at bedtime.    Yes [provider]  Melatonin-Pyridoxine (MELATIN PO) Take 20 mg by mouth daily.   Yes [provider]  metoprolol succinate (TOPROL-XL) 25 MG 24 hr tablet Take 25 mg by mouth daily.  01/15/15  Yes [provider]  Multiple Vitamin (MULTIVITAMIN WITH MINERALS) TABS tablet Take 1 tablet by mouth daily.   Yes [provider]  tamsulosin (FLOMAX) 0.4 MG CAPS  capsule Take 0.4 mg by mouth daily at 12 noon. 01/15/15  Yes [provider]  chlorhexidine (PERIDEX) 0.12 % solution 15 mLs by Mouth Rinse route 2 (two) times daily. Patient not taking: Reported on 03/03/2016 01/25/15   Geradine Girt, DO     Allergies:     Allergies  Allergen Reactions  . Prednisone     REACTION: Reaction not known     Physical Exam:   Vitals  Blood pressure 124/77, pulse 87, temperature 98.5 F (36.9 C), temperature source Rectal, resp. rate 18, SpO2 94 %.   1. General -elderly white male lying in hospital bed in no distress, opens his eyes to verbal commands and to painful stimuli but unable to participate in exam or in history providing, he is wearing BiPAP.  2.  He moves all 4 extremities to painful stimuli, appears dehydrated and dry,.  3. No apparent focal deficits moves all 4 extremities to painful stimuli, plantars are going down bilaterally.  4. Ears and Eyes appear Normal, Conjunctivae clear, PERRLA.  Dry oral Mucosa.  5. Supple Neck, No JVD, No cervical lymphadenopathy appriciated, No Carotid Bruits.  6. Symmetrical Chest wall movement, Good air movement bilaterally, minimal bilateral wheezing,  7. RRR, No Gallops, Rubs or Murmurs, No Parasternal Heave.  8. Positive Bowel Sounds, Abdomen Soft, No tenderness, No organomegaly appriciated,No rebound -guarding or rigidity.  9.  No Cyanosis, Normal Skin Turgor, No Skin Rash or Bruise.  10. Good muscle tone,  joints appear normal , no effusions, Normal ROM.  11. No Palpable Lymph Nodes in Neck or Axillae      Data Review:    CBC Recent Labs  Lab 07/16/18 1452 07/16/18 1458  WBC 11.2*  --   HGB 13.6 14.3  HCT 44.1 42.0  PLT 185  --   MCV 100.2*  --   MCH  30.9  --   MCHC 30.8  --   RDW 13.2  --   LYMPHSABS 0.5*  --   MONOABS 1.0  --   EOSABS 0.1  --   BASOSABS 0.1  --     ------------------------------------------------------------------------------------------------------------------  Chemistries  Recent Labs  Lab 07/16/18 1452 07/16/18 1458  NA 140 139  K 4.1 4.2  CL 97* 98  CO2 30  --   GLUCOSE 175* 180*  BUN 33* 38*  CREATININE 3.85* 3.80*  CALCIUM 8.7*  --   AST 187*  --   ALT 61*  --   ALKPHOS 101  --   BILITOT 1.3*  --    ------------------------------------------------------------------------------------------------------------------ CrCl cannot be calculated (Unknown ideal weight.). ------------------------------------------------------------------------------------------------------------------ No results for input(s): TSH, T4TOTAL, T3FREE, THYROIDAB in the last 72 hours.  Invalid input(s): FREET3  Coagulation profile Recent Labs  Lab 07/16/18 1452  INR 1.07   ------------------------------------------------------------------------------------------------------------------- No results for input(s): DDIMER in the last 72 hours. -------------------------------------------------------------------------------------------------------------------  Cardiac Enzymes No results for input(s): CKMB, TROPONINI, MYOGLOBIN in the last 168 hours.  Invalid input(s): CK ------------------------------------------------------------------------------------------------------------------    Component Value Date/Time   BNP 200.6 (H) 01/18/2015 1035     ---------------------------------------------------------------------------------------------------------------  Urinalysis    Component Value Date/Time   COLORURINE YELLOW 03/03/2016 2110   APPEARANCEUR CLEAR 03/03/2016 2110   LABSPEC 1.026 03/03/2016 2110   PHURINE 5.5 03/03/2016 2110   GLUCOSEU 100 (A) 03/03/2016 2110   HGBUR NEGATIVE 03/03/2016 2110   BILIRUBINUR SMALL (A) 03/03/2016 2110   KETONESUR NEGATIVE 03/03/2016 2110   PROTEINUR NEGATIVE 03/03/2016 2110   UROBILINOGEN 0.2  02/24/2015 2333   NITRITE NEGATIVE 03/03/2016 2110   LEUKOCYTESUR NEGATIVE 03/03/2016 2110    ----------------------------------------------------------------------------------------------------------------   Imaging Results:    Ct Head Wo Contrast  Result Date: 07/16/2018 CLINICAL DATA:  78 year old male with history of trauma. Altered mental status. Found down. EXAM: CT HEAD WITHOUT CONTRAST CT CERVICAL SPINE WITHOUT CONTRAST TECHNIQUE: Multidetector CT imaging of the head and cervical spine was performed following the standard protocol without intravenous contrast. Multiplanar CT image reconstructions of the cervical spine were also generated. COMPARISON:  Head CT 03/07/2017.  Cervical spine CT 03/03/2016. FINDINGS: CT HEAD FINDINGS Brain: Mild cerebral atrophy. Patchy and confluent areas of decreased attenuation are noted throughout the deep and periventricular white matter of the cerebral hemispheres bilaterally, compatible with chronic microvascular ischemic disease. No evidence of acute infarction, hemorrhage, hydrocephalus, extra-axial collection or mass lesion/mass effect. Vascular: No hyperdense vessel or unexpected calcification. Skull: Normal. Negative for fracture or focal lesion. Sinuses/Orbits: Small mucosal retention cyst or polyp in the anterior aspect of the left maxillary sinus incidentally noted. No acute finding. Other: None. CT CERVICAL SPINE FINDINGS Alignment: Exaggerated lordosis at C2-C3, likely positional. Alignment is otherwise anatomic. Skull base and vertebrae: Status post ACDF at C3-C4 with complete bony fusion at this level. No acute fracture. No primary bone lesion or focal pathologic process. Soft tissues and spinal canal: No prevertebral fluid or swelling. No visible canal hematoma. Disc levels: Multilevel degenerative disc disease, most severe at C6-C7 and T1-T2. Moderate multilevel facet arthropathy. Upper chest: Emphysema. Other: None. IMPRESSION: 1. No evidence of  significant acute traumatic injury to the skull, brain or cervical spine. 2. Mild cerebral atrophy with chronic microvascular ischemic changes in the cerebral white matter. 3. Multilevel degenerative disc disease and cervical spondylosis, with postoperative changes of ACDF at C3-C4, as above. Electronically Signed   By: Vinnie Langton M.D.   On: 07/16/2018 16:24  Ct Cervical Spine Wo Contrast  Result Date: 07/16/2018 CLINICAL DATA:  78 year old male with history of trauma. Altered mental status. Found down. EXAM: CT HEAD WITHOUT CONTRAST CT CERVICAL SPINE WITHOUT CONTRAST TECHNIQUE: Multidetector CT imaging of the head and cervical spine was performed following the standard protocol without intravenous contrast. Multiplanar CT image reconstructions of the cervical spine were also generated. COMPARISON:  Head CT 03/07/2017.  Cervical spine CT 03/03/2016. FINDINGS: CT HEAD FINDINGS Brain: Mild cerebral atrophy. Patchy and confluent areas of decreased attenuation are noted throughout the deep and periventricular white matter of the cerebral hemispheres bilaterally, compatible with chronic microvascular ischemic disease. No evidence of acute infarction, hemorrhage, hydrocephalus, extra-axial collection or mass lesion/mass effect. Vascular: No hyperdense vessel or unexpected calcification. Skull: Normal. Negative for fracture or focal lesion. Sinuses/Orbits: Small mucosal retention cyst or polyp in the anterior aspect of the left maxillary sinus incidentally noted. No acute finding. Other: None. CT CERVICAL SPINE FINDINGS Alignment: Exaggerated lordosis at C2-C3, likely positional. Alignment is otherwise anatomic. Skull base and vertebrae: Status post ACDF at C3-C4 with complete bony fusion at this level. No acute fracture. No primary bone lesion or focal pathologic process. Soft tissues and spinal canal: No prevertebral fluid or swelling. No visible canal hematoma. Disc levels: Multilevel degenerative disc  disease, most severe at C6-C7 and T1-T2. Moderate multilevel facet arthropathy. Upper chest: Emphysema. Other: None. IMPRESSION: 1. No evidence of significant acute traumatic injury to the skull, brain or cervical spine. 2. Mild cerebral atrophy with chronic microvascular ischemic changes in the cerebral white matter. 3. Multilevel degenerative disc disease and cervical spondylosis, with postoperative changes of ACDF at C3-C4, as above. Electronically Signed   By: Vinnie Langton M.D.   On: 07/16/2018 16:24   Dg Chest Port 1 View  Result Date: 07/16/2018 CLINICAL DATA:  Patient found down by family. EXAM: PORTABLE CHEST 1 VIEW COMPARISON:  03/07/2017 FINDINGS: The heart size and mediastinal contours are within normal limits. Aortic atherosclerosis is noted at the arch, stable in appearance without aneurysm. Both lungs are clear. The visualized skeletal structures are unremarkable. IMPRESSION: No active disease. Electronically Signed   By: Ashley Royalty M.D.   On: 07/16/2018 14:56    My personal review of EKG: Rhythm NSR extremely poor baseline with a lot of artifacts, nonspecific ST changes   Assessment & Plan:     1.  Acute metabolic encephalopathy.  Likely due to CO2 narcosis (based on VBG, ABG has been ordered stat) from URI versus COPD exacerbation.  Head CT is unremarkable, no apparent focal deficits on limited neuro exam.  He is a DNI, for now he will be placed on BiPAP, will monitor ABG and oxygenation closely, will be kept in stepdown, keep head of the bed elevated at 75 degrees, n.p.o. except medications, fall and aspiration precautions, IV fluids to hydrate, 1 dose of IV Solu-Medrol and IV Levaquin for now then reassess in the morning.  We will also check baseline UA, ammonia levels and rule out influenza.  Place aspirin suppository.   Addendum.  Based on present ABG patient does not have any significant CO2 narcosis, he is chronically compensated for his CO2 retention, discontinue BiPAP,  transition to nasal cannula, UA still pending, will also check MRI.    2.  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  Baseline creatinine around 2.5.  Hydrate, Foley to monitor output, repeat BMP in the morning.  3.  DM type 2 insulin-dependent.  Check A1c, home dose 7030 at  half home dose, hold oral hypoglycemics, every 4 hours CBGs and SQ sliding scale.  4.  Acute hypoxic and hypercapnic respiratory failure.  Due to #1 above.  We will also check a baseline ABG.  5.  Hypothyroidism.  IV Synthroid, check baseline TSH.  6. HTN - PRN Hydralazine.    DVT Prophylaxis Heparin    AM Labs Ordered, also please review Full Orders  Family Communication: Admission, patients condition and plan of care including tests being ordered have been discussed with the daughter who indicates understanding and agree with the plan and Code Status.  Code Status DNI  Likely DC to  TBD  Condition GUARDED    Consults called: None    Admission status: Inpt    Time spent in minutes : 35   Lala Lund M.D on 07/16/2018 at 4:58 PM  To page go to [email or web address] - password [facility]

## 2018-07-16 NOTE — ED Provider Notes (Signed)
[facility] EMERGENCY DEPARTMENT Provider Note   CSN: [id number] Arrival date & time: 07/16/18  1358     History   Chief Complaint No chief complaint on file.   HPI Harold Martin is a 78 y.o. male.  78 year old male with past medical history including COPD, T2DM, HTN, TIA who p/w altered mental status.  Daughter reports that she last saw the patient in his usual state of health 3 days ago.  Yesterday at noon she talked to them on the phone and he seemed a Joe Tanney bit confused but he can sometimes have disorientation after napping.  He was found down on the floor by his daughter today, unknown downtime.  He normally wears oxygen at night. Daughter reports he has had a cold recently.  LEVEL 5 CAVEAT DUE TO AMS  The history is provided by the patient.    Past Medical History:  Diagnosis Date  . COPD (chronic obstructive pulmonary disease) ([place])   . Diabetes mellitus without complication ([place])   . Hypertension   . Hypothyroidism   . Shortness of breath   . TIA (transient ischemic attack)     Patient Active Problem List   Diagnosis Date Noted  . Metabolic encephalopathy 00/34/9179  . Altered mental state 03/04/2016  . Acute renal failure ([place])   . COPD (chronic obstructive pulmonary disease) ([place]) 02/26/2015  . Acute bronchitis 02/26/2015  . Leukocytosis 02/25/2015  . Acute encephalopathy 02/24/2015  . Swallowing difficulty 01/20/2015  . COPD exacerbation ([place]) 01/18/2015  . Acute renal failure superimposed on stage 3 chronic kidney disease ([place]) 01/16/2015  . Type 2 diabetes mellitus with hyperglycemia ([place]) 01/16/2015  . Essential hypertension 01/16/2015  . Normocytic anemia   . Hyponatremia 10/06/2013  . GI bleed 10/05/2013  . EMPHYSEMA 10/01/2009  . DYSPNEA ON EXERTION 09/07/2009  . CARCINOMA, BLADDER 09/06/2009  . DIABETES, TYPE 2 09/06/2009  . HYPERLIPIDEMIA 09/06/2009  . HYPERTENSION 09/06/2009    Past Surgical History:  Procedure  Laterality Date  . COLONOSCOPY N/A 09/23/2013   Procedure: COLONOSCOPY;  Surgeon: Beryle Beams, MD;  Location: [facility] ENDOSCOPY;  Service: Endoscopy;  Laterality: N/A;  . ESOPHAGOGASTRODUODENOSCOPY N/A 09/23/2013   Procedure: ESOPHAGOGASTRODUODENOSCOPY (EGD);  Surgeon: Beryle Beams, MD;  Location: [facility];  Service: Endoscopy;  Laterality: N/A;  . GIVENS CAPSULE STUDY N/A 10/07/2013   Procedure: GIVENS CAPSULE STUDY;  Surgeon: Beryle Beams, MD;  Location: [facility] ENDOSCOPY;  Service: Endoscopy;  Laterality: N/A;  . surgery for bladder cancer  yrs ago        Home Medications    Prior to Admission medications   Medication Sig Start Date End Date Taking? Authorizing Provider  aspirin (ASPIRIN CHILDRENS) 81 MG chewable tablet Chew 1 tablet (81 mg total) by mouth daily. 02/27/15  Yes Delfina Redwood, MD  glipiZIDE (GLUCOTROL) 10 MG tablet Take 10 mg by mouth daily.   Yes [provider]  insulin NPH Human (HUMULIN N,NOVOLIN N) 100 UNIT/ML injection Inject 40 Units into the skin daily.    Yes [provider]  levothyroxine (SYNTHROID, LEVOTHROID) 125 MCG tablet Take 1 tablet (125 mcg total) by mouth daily before breakfast. 02/27/15  Yes Delfina Redwood, MD  magnesium oxide (MAG-OX) 400 MG tablet Take 400 mg by mouth at bedtime.    Yes [provider]  Melatonin-Pyridoxine (MELATIN PO) Take 20 mg by mouth daily.   Yes [provider]  metoprolol succinate (TOPROL-XL) 25 MG 24 hr tablet Take 25 mg by  mouth daily.  01/15/15  Yes [provider]  Multiple Vitamin (MULTIVITAMIN WITH MINERALS) TABS tablet Take 1 tablet by mouth daily.   Yes [provider]  tamsulosin (FLOMAX) 0.4 MG CAPS capsule Take 0.4 mg by mouth daily at 12 noon. 01/15/15  Yes [provider]  chlorhexidine (PERIDEX) 0.12 % solution 15 mLs by Mouth Rinse route 2 (two) times daily. Patient not taking: Reported on 03/03/2016 01/25/15   Geradine Girt, DO    Family  History Family History  Problem Relation Age of Onset  . Hypertension Other     Social History Social History   Tobacco Use  . Smoking status: Never Smoker  . Smokeless tobacco: Current User    Types: Chew  Substance Use Topics  . Alcohol use: No  . Drug use: No     Allergies   Prednisone   Review of Systems Review of Systems  Unable to perform ROS: Mental status change     Physical Exam Updated Vital Signs BP 124/77   Pulse 87   Temp 98.5 F (36.9 C) (Rectal)   Resp (!) 22   SpO2 94%   Physical Exam Vitals signs and nursing note reviewed.  Constitutional:      General: He is not in acute distress.    Appearance: He is well-developed.  HENT:     Head: Normocephalic and atraumatic.     Mouth/Throat:     Mouth: Mucous membranes are dry.     Comments: Peeling lips Eyes:     General:        Right eye: Discharge present.        Left eye: Discharge present.    Conjunctiva/sclera: Conjunctivae normal.     Pupils: Pupils are equal, round, and reactive to light.  Neck:     Musculoskeletal: Neck supple.  Cardiovascular:     Rate and Rhythm: Normal rate and regular rhythm.     Heart sounds: Murmur present.  Pulmonary:     Effort: Pulmonary effort is normal.     Comments: Rhonchi b/l, mildly increased WOB Abdominal:     General: Bowel sounds are normal. There is no distension.     Palpations: Abdomen is soft.     Tenderness: There is no abdominal tenderness.  Skin:    General: Skin is warm and dry.     Comments: Scattered ecchymoses on L flank, b/l legs and knees with some scabs  Neurological:     Mental Status: He is alert.     Comments: Altered, somnolent, mumbles words then falls back asleep, no facial asymmetry      ED Treatments / Results  Labs (all labs ordered are listed, but only abnormal results are displayed) Labs Reviewed  COMPREHENSIVE METABOLIC PANEL - Abnormal; Notable for the following components:      Result Value   Chloride 97 (*)     Glucose, Bld 175 (*)    BUN 33 (*)    Creatinine, Ser 3.85 (*)    Calcium 8.7 (*)    AST 187 (*)    ALT 61 (*)    Total Bilirubin 1.3 (*)    GFR calc non Af Amer 14 (*)    GFR calc Af Amer 17 (*)    All other components within normal limits  ACETAMINOPHEN LEVEL - Abnormal; Notable for the following components:   Acetaminophen (Tylenol), Serum <10 (*)    All other components within normal limits  CBC WITH DIFFERENTIAL/PLATELET - Abnormal; Notable for  the following components:   WBC 11.2 (*)    MCV 100.2 (*)    Neutro Abs 9.5 (*)    Lymphs Abs 0.5 (*)    Abs Immature Granulocytes 0.08 (*)    All other components within normal limits  I-STAT CHEM 8, ED - Abnormal; Notable for the following components:   BUN 38 (*)    Creatinine, Ser 3.80 (*)    Glucose, Bld 180 (*)    Calcium, Ion 1.01 (*)    TCO2 34 (*)    All other components within normal limits  I-STAT VENOUS BLOOD GAS, ED - Abnormal; Notable for the following components:   pCO2, Ven 70.6 (*)    pO2, Ven 25.0 (*)    Bicarbonate 35.8 (*)    TCO2 38 (*)    Acid-Base Excess 6.0 (*)    All other components within normal limits  ETHANOL  SALICYLATE LEVEL  PROTIME-INR  URINALYSIS, ROUTINE W REFLEX MICROSCOPIC  CK  BLOOD GAS, ARTERIAL  AMMONIA  I-STAT CG4 LACTIC ACID, ED  I-STAT TROPONIN, ED  I-STAT CG4 LACTIC ACID, ED  I-STAT ARTERIAL BLOOD GAS, ED    EKG EKG Interpretation  Date/Time:  Friday July 16 2018 14:09:16 EST Ventricular Rate:  90 PR Interval:    QRS Duration: 93 QT Interval:  385 QTC Calculation: 472 R Axis:   -40 Text Interpretation:  AV dissociation Probable left atrial enlargement Left axis deviation Abnormal R-wave progression, early transition Borderline abnrm T, anterolateral leads Interpretation limited secondary to artifact roughly similar to previous Confirmed by Theotis Burrow [phone]) on 07/16/2018 2:59:32 PM   Radiology Ct Head Wo Contrast  Result Date: 07/16/2018 CLINICAL DATA:   78 year old male with history of trauma. Altered mental status. Found down. EXAM: CT HEAD WITHOUT CONTRAST CT CERVICAL SPINE WITHOUT CONTRAST TECHNIQUE: Multidetector CT imaging of the head and cervical spine was performed following the standard protocol without intravenous contrast. Multiplanar CT image reconstructions of the cervical spine were also generated. COMPARISON:  Head CT 03/07/2017.  Cervical spine CT 03/03/2016. FINDINGS: CT HEAD FINDINGS Brain: Mild cerebral atrophy. Patchy and confluent areas of decreased attenuation are noted throughout the deep and periventricular white matter of the cerebral hemispheres bilaterally, compatible with chronic microvascular ischemic disease. No evidence of acute infarction, hemorrhage, hydrocephalus, extra-axial collection or mass lesion/mass effect. Vascular: No hyperdense vessel or unexpected calcification. Skull: Normal. Negative for fracture or focal lesion. Sinuses/Orbits: Small mucosal retention cyst or polyp in the anterior aspect of the left maxillary sinus incidentally noted. No acute finding. Other: None. CT CERVICAL SPINE FINDINGS Alignment: Exaggerated lordosis at C2-C3, likely positional. Alignment is otherwise anatomic. Skull base and vertebrae: Status post ACDF at C3-C4 with complete bony fusion at this level. No acute fracture. No primary bone lesion or focal pathologic process. Soft tissues and spinal canal: No prevertebral fluid or swelling. No visible canal hematoma. Disc levels: Multilevel degenerative disc disease, most severe at C6-C7 and T1-T2. Moderate multilevel facet arthropathy. Upper chest: Emphysema. Other: None. IMPRESSION: 1. No evidence of significant acute traumatic injury to the skull, brain or cervical spine. 2. Mild cerebral atrophy with chronic microvascular ischemic changes in the cerebral white matter. 3. Multilevel degenerative disc disease and cervical spondylosis, with postoperative changes of ACDF at C3-C4, as above.  Electronically Signed   By: Vinnie Langton M.D.   On: 07/16/2018 16:24   Ct Cervical Spine Wo Contrast  Result Date: 07/16/2018 CLINICAL DATA:  78 year old male with history of trauma. Altered mental status. Found down.  EXAM: CT HEAD WITHOUT CONTRAST CT CERVICAL SPINE WITHOUT CONTRAST TECHNIQUE: Multidetector CT imaging of the head and cervical spine was performed following the standard protocol without intravenous contrast. Multiplanar CT image reconstructions of the cervical spine were also generated. COMPARISON:  Head CT 03/07/2017.  Cervical spine CT 03/03/2016. FINDINGS: CT HEAD FINDINGS Brain: Mild cerebral atrophy. Patchy and confluent areas of decreased attenuation are noted throughout the deep and periventricular white matter of the cerebral hemispheres bilaterally, compatible with chronic microvascular ischemic disease. No evidence of acute infarction, hemorrhage, hydrocephalus, extra-axial collection or mass lesion/mass effect. Vascular: No hyperdense vessel or unexpected calcification. Skull: Normal. Negative for fracture or focal lesion. Sinuses/Orbits: Small mucosal retention cyst or polyp in the anterior aspect of the left maxillary sinus incidentally noted. No acute finding. Other: None. CT CERVICAL SPINE FINDINGS Alignment: Exaggerated lordosis at C2-C3, likely positional. Alignment is otherwise anatomic. Skull base and vertebrae: Status post ACDF at C3-C4 with complete bony fusion at this level. No acute fracture. No primary bone lesion or focal pathologic process. Soft tissues and spinal canal: No prevertebral fluid or swelling. No visible canal hematoma. Disc levels: Multilevel degenerative disc disease, most severe at C6-C7 and T1-T2. Moderate multilevel facet arthropathy. Upper chest: Emphysema. Other: None. IMPRESSION: 1. No evidence of significant acute traumatic injury to the skull, brain or cervical spine. 2. Mild cerebral atrophy with chronic microvascular ischemic changes in the  cerebral white matter. 3. Multilevel degenerative disc disease and cervical spondylosis, with postoperative changes of ACDF at C3-C4, as above. Electronically Signed   By: Vinnie Langton M.D.   On: 07/16/2018 16:24   Dg Chest Port 1 View  Result Date: 07/16/2018 CLINICAL DATA:  Patient found down by family. EXAM: PORTABLE CHEST 1 VIEW COMPARISON:  03/07/2017 FINDINGS: The heart size and mediastinal contours are within normal limits. Aortic atherosclerosis is noted at the arch, stable in appearance without aneurysm. Both lungs are clear. The visualized skeletal structures are unremarkable. IMPRESSION: No active disease. Electronically Signed   By: Ashley Royalty M.D.   On: 07/16/2018 14:56    Procedures .Critical Care Performed by: Sharlett Iles, MD Authorized by: Sharlett Iles, MD   Critical care provider statement:    Critical care time (minutes):  30   Critical care time was exclusive of:  Separately billable procedures and treating other patients   Critical care was necessary to treat or prevent imminent or life-threatening deterioration of the following conditions:  Respiratory failure and dehydration   Critical care was time spent personally by me on the following activities:  Development of treatment plan with patient or surrogate, evaluation of patient's response to treatment, examination of patient, obtaining history from patient or surrogate, ordering and performing treatments and interventions, ordering and review of laboratory studies, ordering and review of radiographic studies and re-evaluation of patient's condition   (including critical care time)  Medications Ordered in ED Medications  lactated ringers bolus 1,000 mL (has no administration in time range)  lactated ringers infusion (has no administration in time range)  lactated ringers infusion (has no administration in time range)  lactated ringers bolus 1,000 mL (1,000 mLs Intravenous New Bag/Given 07/16/18  1523)     Initial Impression / Assessment and Plan / ED Course  I have reviewed the triage vital signs and the nursing notes.  Pertinent labs & imaging results that were available during my care of the patient were reviewed by me and considered in my medical decision making (see chart for details).  Pt was alerted on exam but with reassuring VS, afebrile with no hypoxia. Venous pH 7.31 CO2 70. Placed on bipap. Cr 3.85 which is elevated from baseline, AST 187, ALT 61, bili 1.3. WBC 11.2, Hgb normal. I have ordered IVF bolus and CK is pending to evaluate for rhabdomyolysis given unknown down time.   XR negative acute. CT head and c-spine negative acute. Daughter states he is DNI but would want chest compressions. Discussed admission with Dr. Candiss Norse. I have ordered repeat EKG, ABG, and I&O cath to obtain UA. Pt admitted for further care.  Final Clinical Impressions(s) / ED Diagnoses   Final diagnoses:  None    ED Discharge Orders    None       Dmiya Malphrus, Wenda Overland, MD 07/16/18 [phone]

## 2018-07-16 NOTE — Progress Notes (Signed)
Patient off unit via bed by transport for test. No distress noted when patient left unit. PC monitor intact to patient.

## 2018-07-16 NOTE — ED Notes (Signed)
Pt has condom cath on and is hooked to bag.

## 2018-07-16 NOTE — Progress Notes (Signed)
Pt arrived. Placed on PC monitor. Pt skin assessed. Pt on 2L oxygen with HOB elevated. Pt not in any apparent distress at this time.

## 2018-07-17 ENCOUNTER — Inpatient Hospital Stay (HOSPITAL_COMMUNITY): Payer: Medicare Other

## 2018-07-17 DIAGNOSIS — G9340 Encephalopathy, unspecified: Secondary | ICD-10-CM

## 2018-07-17 LAB — BLOOD GAS, ARTERIAL
Acid-Base Excess: 6 mmol/L — ABNORMAL HIGH (ref 0.0–2.0)
Bicarbonate: 30.8 mmol/L — ABNORMAL HIGH (ref 20.0–28.0)
Drawn by: 42624
O2 Content: 3 L/min
O2 Saturation: 93.3 %
PCO2 ART: 52.1 mmHg — AB (ref 32.0–48.0)
Patient temperature: 98.6
pH, Arterial: 7.39 (ref 7.350–7.450)
pO2, Arterial: 68.2 mmHg — ABNORMAL LOW (ref 83.0–108.0)

## 2018-07-17 LAB — GLUCOSE, CAPILLARY
Glucose-Capillary: 123 mg/dL — ABNORMAL HIGH (ref 70–99)
Glucose-Capillary: 137 mg/dL — ABNORMAL HIGH (ref 70–99)
Glucose-Capillary: 143 mg/dL — ABNORMAL HIGH (ref 70–99)
Glucose-Capillary: 148 mg/dL — ABNORMAL HIGH (ref 70–99)
Glucose-Capillary: 152 mg/dL — ABNORMAL HIGH (ref 70–99)
Glucose-Capillary: 175 mg/dL — ABNORMAL HIGH (ref 70–99)

## 2018-07-17 LAB — COMPREHENSIVE METABOLIC PANEL
ALK PHOS: 90 U/L (ref 38–126)
ALT: 55 U/L — ABNORMAL HIGH (ref 0–44)
ANION GAP: 14 (ref 5–15)
AST: 171 U/L — ABNORMAL HIGH (ref 15–41)
Albumin: 3.6 g/dL (ref 3.5–5.0)
BUN: 32 mg/dL — ABNORMAL HIGH (ref 8–23)
CO2: 28 mmol/L (ref 22–32)
Calcium: 8.4 mg/dL — ABNORMAL LOW (ref 8.9–10.3)
Chloride: 99 mmol/L (ref 98–111)
Creatinine, Ser: 3.27 mg/dL — ABNORMAL HIGH (ref 0.61–1.24)
GFR calc Af Amer: 20 mL/min — ABNORMAL LOW (ref >60)
GFR calc non Af Amer: 17 mL/min — ABNORMAL LOW (ref >60)
GLUCOSE: 151 mg/dL — AB (ref 70–99)
Potassium: 3.9 mmol/L (ref 3.5–5.1)
Sodium: 141 mmol/L (ref 135–145)
Total Bilirubin: 0.9 mg/dL (ref 0.3–1.2)
Total Protein: 6.9 g/dL (ref 6.5–8.1)

## 2018-07-17 LAB — CBC
HCT: 39.9 % (ref 39.0–52.0)
Hemoglobin: 12.3 g/dL — ABNORMAL LOW (ref 13.0–17.0)
MCH: 30.6 pg (ref 26.0–34.0)
MCHC: 30.8 g/dL (ref 30.0–36.0)
MCV: 99.3 fL (ref 80.0–100.0)
Platelets: 166 10*3/uL (ref 150–400)
RBC: 4.02 MIL/uL — AB (ref 4.22–5.81)
RDW: 13.3 % (ref 11.5–15.5)
WBC: 10.1 10*3/uL (ref 4.0–10.5)
nRBC: 0 % (ref 0.0–0.2)

## 2018-07-17 LAB — CORTISOL: Cortisol, Plasma: 17.6 ug/dL

## 2018-07-17 MED ORDER — CHLORHEXIDINE GLUCONATE 0.12 % MT SOLN
15.0000 mL | Freq: Two times a day (BID) | OROMUCOSAL | Status: DC
  Administered 2018-07-17 – 2018-07-29 (×21): 15 mL via OROMUCOSAL
  Filled 2018-07-17 (×23): qty 15

## 2018-07-17 MED ORDER — LACTATED RINGERS IV SOLN
INTRAVENOUS | Status: DC
  Administered 2018-07-17 (×2): via INTRAVENOUS

## 2018-07-17 MED ORDER — METHYLPREDNISOLONE SODIUM SUCC 125 MG IJ SOLR
60.0000 mg | INTRAMUSCULAR | Status: DC
  Administered 2018-07-17: 60 mg via INTRAVENOUS
  Filled 2018-07-17: qty 2

## 2018-07-17 MED ORDER — LEVOTHYROXINE SODIUM 100 MCG/5ML IV SOLN
150.0000 ug | Freq: Every day | INTRAVENOUS | Status: DC
  Administered 2018-07-17 – 2018-07-18 (×2): 150 ug via INTRAVENOUS
  Filled 2018-07-17 (×2): qty 10

## 2018-07-17 NOTE — Consult Note (Signed)
Neurology Consultation  Reason for Consult: AMS Referring Physician: Dr/ Candiss Norse   CC: AMS  History is obtained from: Chart review and patient  HPI: JOVANY DISANO is a 78 y.o. male  With a PMH of Severe COPD, smoke less tobacco use, thyroid disease, DM with use of insulin presented to the ER on 07/16/2018 for AMS. Apparently he talks to his daughter daily but there had been no communication for about 2 days. This was concerning so it prompted her to got to his home for further evaluation. There is where she found his down unresponsive. He appeared with facial and knee abrasions as if he had a fall. His daughter called EMS and he was transferred to the ER for further evaluation.   Per chart review he was found in thr ER to be dehydrated BUN 32,  ARF Cr-3.27, elevated AST 171, ALT slightly 55, ammonia normal 23, hypoxic with a PC02 52, P0268.2 Hco3 30.8 Rhabodo CK 7,876, TSH 87.68 and was obtunded. Toxicology screen was negative.   CT brain was completed and was negative for acutue abnormalities but showed signs of atrophy with chronic microvascular ischemic changes in the cerebral white matter. There was no traumatic injury to skull or brain, or cervical spine. Upper chest did show signs of emphysema.MRI was negative for stroke  and did not correlate any findings with his clinical presentation.  U/S renal was completed secondary to acute renal failure that showed  Bilateral diffuse renal parenchymal atrophy.  No hydronephrosis.  Today he remains lethargic and will will awake only temporarily to engage. His mental status remains altered. He is being placed on EEG for further monitoring. He had received fludi resuscitation with LR.    ROS: A 14 point ROS was performed and is negative except as noted in the HPI.  Unable to obtain due to altered mental status.   Past Medical History:  Diagnosis Date  . COPD (chronic obstructive pulmonary disease) ([place])   . Diabetes mellitus without complication  ([place])   . Hypertension   . Hypothyroidism   . Shortness of breath   . TIA (transient ischemic attack)     Family History  Problem Relation Age of Onset  . Hypertension Other     Social History:   reports that he has never smoked. His smokeless tobacco use includes chew. He reports that he does not drink alcohol or use drugs.  Medications  Current Facility-Administered Medications:  .  acetaminophen (TYLENOL) tablet 650 mg, 650 mg, Oral, Q6H PRN **OR** acetaminophen (TYLENOL) suppository 650 mg, 650 mg, Rectal, Q6H PRN, Thurnell Lose, MD .  aspirin suppository 300 mg, 300 mg, Rectal, Daily, Thurnell Lose, MD, 300 mg at 07/17/18 0831 .  bisacodyl (DULCOLAX) EC tablet 5 mg, 5 mg, Oral, Daily PRN, Lala Lund K, MD .  chlorhexidine (PERIDEX) 0.12 % solution 15 mL, 15 mL, Mouth Rinse, BID, Lala Lund K, MD, 15 mL at 07/17/18 0831 .  haloperidol lactate (HALDOL) injection 2 mg, 2 mg, Intravenous, Q6H PRN, Thurnell Lose, MD .  heparin injection 5,000 Units, 5,000 Units, Subcutaneous, Q8H, Thurnell Lose, MD, 5,000 Units at 07/17/18 1223 .  hydrALAZINE (APRESOLINE) injection 10 mg, 10 mg, Intravenous, Q6H PRN, Lala Lund K, MD .  insulin aspart (novoLOG) injection 0-15 Units, 0-15 Units, Subcutaneous, Q4H, Thurnell Lose, MD, 2 Units at 07/17/18 1223 .  insulin NPH Human (HUMULIN N,NOVOLIN N) injection 20 Units, 20 Units, Subcutaneous, QAC breakfast, Thurnell Lose, MD .  lactated ringers infusion, , Intravenous, Continuous, Thurnell Lose, MD, Last Rate: 100 mL/hr at 07/17/18 0729 .  levothyroxine (SYNTHROID, LEVOTHROID) injection 62.5 mcg, 62.5 mcg, Intravenous, Q0600, Thurnell Lose, MD, 62.5 mcg at 07/17/18 0508 .  MEDLINE mouth rinse, 15 mL, Mouth Rinse, BID, Thurnell Lose, MD, 15 mL at 07/17/18 0831 .  tamsulosin (FLOMAX) capsule 0.4 mg, 0.4 mg, Oral, Q1200, Thurnell Lose, MD   Exam: Current vital signs: BP 99/64 (BP Location: Left  Arm)   Pulse 80   Temp 98.8 F (37.1 C) (Oral)   Resp 18   Ht 6\' 1"  (1.854 m)   Wt 101.3 kg   SpO2 93%   BMI 29.46 kg/m  Vital signs in last 24 hours: Temp:  [97.4 F (36.3 C)-99 F (37.2 C)] 98.8 F (37.1 C) (01/11 1100) Pulse Rate:  [75-95] 80 (01/11 1100) Resp:  [15-26] 18 (01/11 1100) BP: (99-145)/(64-108) 99/64 (01/11 1100) SpO2:  [89 %-100 %] 93 % (01/11 1100) Weight:  [100.1 kg-101.3 kg] 101.3 kg (01/11 0347)  GENERAL: obtunded -needs aggressive noxious stimuli to awake HEENT: - Normocephalic and atraumatic, dry mm, no LN++, no Thyromegally LUNGS -no sign of resp distress CV - S1S2 RRR,  ABDOMEN - Soft, nontender, nondistended with normoactive BS Ext: warm, well perfused, intact peripheral pulses,  edema  NEURO:  Mental Status: unable to state name, years, answer no to years of 1965, 2019, and 2020. Unable to state place time, nor situation- does not overall follow most commands with movement of extremities Language: scattered, without complete sentence Cranial Nerves: PERRL 3 mm/brisk. EOMI, visual fields full able to answer visual field shortly before closing his eyes again, no facial asymmetry facial sensation intact, hearing intact, tongue/uvula/soft palate midline- Tongue with excessive thick yeast   Motor: sporadic movements in all ext's  Tone: is normal and bulk is normal Sensation- Intact to light touch bilaterally appears intact t/o responds to noxious stimuli- states that foot tickles when touched. At times apropriate Coordination: not tested to complex with AM  Gait- deferred   Labs I have reviewed labs in epic and the results pertinent to this consultation are: Per chart review he was found in thr ER to be dehydrated BUN 32,  ARF Cr-3.27, elevated AST 171, ALT slightly 55, ammonia normal 23, hypoxic with a PC02 52, P0268.2 Hco3 30.8 Rhabodo CK 7,876, TSH 87.68 and was obtunded. Toxicology screen was negative.  CBC without leukocytosis CBC    Component  Value Date/Time   WBC 10.1 07/17/2018 0255   RBC 4.02 (L) 07/17/2018 0255   HGB 12.3 (L) 07/17/2018 0255   HCT 39.9 07/17/2018 0255   HCT 32.2 (L) 03/04/2016 0404   PLT 166 07/17/2018 0255   MCV 99.3 07/17/2018 0255   MCH 30.6 07/17/2018 0255   MCHC 30.8 07/17/2018 0255   RDW 13.3 07/17/2018 0255   LYMPHSABS 0.5 (L) 07/16/2018 1452   MONOABS 1.0 07/16/2018 1452   EOSABS 0.1 07/16/2018 1452   BASOSABS 0.1 07/16/2018 1452    CMP     Component Value Date/Time   NA 141 07/17/2018 0255   K 3.9 07/17/2018 0255   CL 99 07/17/2018 0255   CO2 28 07/17/2018 0255   GLUCOSE 151 (H) 07/17/2018 0255   BUN 32 (H) 07/17/2018 0255   CREATININE 3.27 (H) 07/17/2018 0255   CALCIUM 8.4 (L) 07/17/2018 0255   PROT 6.9 07/17/2018 0255   ALBUMIN 3.6 07/17/2018 0255   AST 171 (H) 07/17/2018 0255  ALT 55 (H) 07/17/2018 0255   ALKPHOS 90 07/17/2018 0255   BILITOT 0.9 07/17/2018 0255   GFRNONAA 17 (L) 07/17/2018 0255   GFRAA 20 (L) 07/17/2018 0255    Lipid Panel     Component Value Date/Time   CHOL 180 02/25/2015 0220   TRIG 251 (H) 02/25/2015 0220   HDL 25 (L) 02/25/2015 0220   CHOLHDL 7.2 02/25/2015 0220   VLDL 50 (H) 02/25/2015 0220   LDLCALC 105 (H) 02/25/2015 0220     Imaging I have reviewed the images obtained:  CT-scan of the brain/ CT cervical spine 1. No evidence of significant acute traumatic injury to the skull, brain or cervical spine. 2. Mild cerebral atrophy with chronic microvascular ischemic changes in the cerebral white matter. 3. Multilevel degenerative disc disease and cervical spondylosis, with postoperative changes of ACDF at C3-C4, as above.  MRI examination of the brain 1. Normal MRI appearance of the brain for age. No acute or focal abnormality to explain the patient's symptoms. 2. Left mastoid effusion. No obstructing nasopharyngeal lesion is present. 3. Degenerative changes of the cervical spine adjacent level disease at C2-3 following fusion at  C3-4.  Assessment:  DANYL DEEMS is a 78 y.o. male  With a PMH of Severe COPD, smoke less tobacco use, thyroid disease, DM with use of insulin presented to the ER on 07/16/2018 for AMS. Apparently he talks to his daughter daily but there had been no communication for about 2 days. This was concerning so it prompted her to got to his home for further evaluation. There is where she found his down unresponsive. He appeared with facial and knee abrasions as if he had a fall. His daughter called EMS and he was transferred to the ER for further evaluation. He was found with ARF, Rhabdo, elevated liver enzymes, and hypoxia.   Impression: Seizure vs AMS secondary to encephaphalopathy and metabolic disarray, and episode of hypoxia with known hx of severe COPD  Recommendations: 1. EEG- evaluate for seizure activity- cont. seizure precautions  2. B12, Folate, RPR. TSH 87.68 ( could be acutely elevated) currently on 150 mcg of synthroid for replacement hypothyroid.  3. Continue medical management. Likely ecephalopathy and AMS will improve with corrections of metabolic disarray. We will cont to monitor    Monitor for signs of oxygen desaturation low o2 on RA 89% currently stable on 2LnC - follow if he may need O2 post d/c    Lilyan Gilford Neuro hospitalist NP    NEUROHOSPITALIST ADDENDUM Performed a face to face diagnostic evaluation.   I have reviewed the contents of history and physical exam as documented by PA/ARNP/Resident and agree with above documentation.  I have discussed and formulated the above plan as documented. Edits to the note have been made as needed.  Impression: 78 year old male admitted for altered mental status-found down.   Reason for acute metabolic encephalopathy multifactorial including hypercarbia, hypothyroidism with TSH of 90.    Patient underwent MRI brain which rule out acute stroke.  EEG was performed which showed generalized slowing but no epileptiform discharges.   Patient not subclinical status.   Key exam findings: Oriented x3 follows commands appropriately 4+ out of 5 strength in all 4 extremities.  No cranial nerve deficits  Plan:  Continue to treat toxic metabolic insults. Check A83 and folic acid.  Continue replace thyroid hormone.    Neurology will sign off.  Please call back with questions.   Karena Addison  MD [facility] [phone]   If  7pm to 7am, please call on call as listed on AMION.

## 2018-07-17 NOTE — Procedures (Signed)
ELECTROENCEPHALOGRAM REPORT   Patient: Harold Martin       Room #: 5Y34M EEG No. ID: [id number] Age: 78 y.o.        Sex: male Referring Physician: Candiss Norse Report Date:  07/17/2018        Interpreting Physician: Alexis Goodell  History: [name] is an 78 y.o. male with altered mental status  Medications:  ASA, Insulin, Synthroid, Solumedrol, Flomax  Conditions of Recording:  This is a 21 channel routine scalp EEG performed with bipolar and monopolar montages arranged in accordance to the international 10/20 system of electrode placement. One channel was dedicated to EKG recording.  The patient is in the lethargic state.  Description:  The background activity is slow and poorly organized.  It consists of a low voltage polymorphic delta activity with some rare theta activity noted as well.  This slow activity is diffusely distributed and continuous.  Attempts are made to stimulate the patient and despite attempts there is no change in the background rhythm.   No epileptiform activity is noted.   Hyperventilation and intermittent photic stimulation were not performed.  IMPRESSION: This is an abnormal EEG secondary to general background slowing.  This finding may be seen with a diffuse disturbance that is etiologically nonspecific, but may include a metabolic encephalopathy, among other possibilities.  No epileptiform activity was noted.     Alexis Goodell, MD Neurology [phone] 07/17/2018, 2:05 PM

## 2018-07-17 NOTE — Significant Event (Signed)
Rapid Response Event Note Nikki RN called for acute desaturation 82%  Overview: Time Called: 2110 Arrival Time: 2115 Event Type: Respiratory  Initial Focused Assessment: Upon arrival, Mr. Glassco was sitting upright in the bed in a high fowlers position, drowsy, responds to verbal stimuli, and follows simple commands.  He does not appear to be in respiratory distress but very fatigued and weak.  Some abdominal accessory muscle use.  Oxygen saturations upon arrival on 35% VM was 86% after one attempt at NTS. BBS Rhonchi with diminished BS on the Right more than Left.  Mr. Nehme has a very weak cough. RR 18-22.  NTS performed by myself on 2 passes and copious amounts purulent secretions.  Pt placed on NRB at 15 L and sats now 93%.  Nikki notified Tylene Fantasia of events and orders received.  PCXR done after my NTS.  Interventions: -NTS  -Stat PCXR (ordered by APP)  Plan of Care (if not transferred): -Follow aspiration precautions, HOB > 30 degrees at all times -Wean from NRB back to VM as tolerated for sats > 92% -avoid oversedation -Notify primary svc and or RRRN of any further clinical decompensations. -Consider ABG if hypoxia and lethargy continue  Event Summary: Name of Physician Notified: Tylene Fantasia NP by Lexine Baton RN at 2115   Outcome: Stayed in room and stabalized  Event End Time: 2152  Madelynn Done

## 2018-07-17 NOTE — Progress Notes (Addendum)
PROGRESS NOTE                                                                                                                                                                                                             Patient Demographics:    Harold Martin, is a 78 y.o. male, DOB - 06/14/1941, OEV:[id number]  Admit date - 07/16/2018   Admitting Physician Thurnell Lose, MD  Outpatient Primary MD for the patient is Anda Kraft, MD  LOS - 1  CC AMS     Brief Narrative   Harold Martin  is a 78 y.o. male, severe COPD, hypothyroidism, insulin-dependent type 2 diabetes mellitus who lives at home alone, still walks and ambulates by himself and drives a car, who had been suffering with mild cold for the last few days, was talking to his daughter on a daily basis last conversation 2 days ago, this afternoon when the daughter tried to get in touch with the patient she did not get any response, when she went to visit him which he was found down on the floor unresponsive.  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    Subjective:    [name] today remains in bed, obtunded unable to answer questions.  Cannot follow commands.   Assessment  & Plan :     1.  Metabolic encephalopathy likely due to early myxedema coma.  TSH extremely elevated, was on Synthroid at home but I question his compliance.  For now continue to hydrate aggressively, IV Synthroid, IV glucocorticoid, he has already received 1 dose of IV Solu-Medrol yesterday hence results could be biased.  Will check free T4 and T3 and monitor TSH levels.  CT head, MRI brain negative, ABG and ammonia stable.  Monitor with supportive care.  2.  Severe dehydration with hypernatremia and ARF.  Improving with hydration which will be continued, rhabdomyolysis is improving, he has underlying CKD 4 with baseline creatinine around  2.5.  Renal ultrasound was nonacute.  Has Foley which will be continued for now.  3.  Rhabdomyolysis.  Hydrate and monitor.  4.  COPD.  Initial blood gas was venous, ABG was stable.  No acute issues supportive care.  5.  Pretension.  PRN hydralazine only for now.  6.  Left knee effusion.  X-ray is nonacute, requested orthopedics to see if he requires a tap.  Clinically I doubt he has an infection.  Have mild hemarthrosis as he has some skin scabs above his left knee likely sustained when he fell.  7. DM type II.  Now insulin-dependent.  Continue half home dose 7030 along with sliding scale and monitor.  Lab Results  Component Value Date   HGBA1C 7.5 (H) 07/16/2018   CBG (last 3)  Recent Labs    07/17/18 0340 07/17/18 0814 07/17/18 1214  GLUCAP 148* 65* 143*     Family Communication  :  Daughter at the time of admission  Code Status :  Full  Disposition Plan  :  TBD likely SNF  Consults  :  Ortho  Procedures  :    MRI - non acute  Renal US - CKD  EEG -    DVT Prophylaxis  :   Heparin   Lab Results  Component Value Date   PLT 166 07/17/2018    Diet :  Diet Order            Diet NPO time specified Except for: Sips with Meds  Diet effective now               Inpatient Medications Scheduled Meds: . aspirin  300 mg Rectal Daily  . chlorhexidine  15 mL Mouth Rinse BID  . heparin  5,000 Units Subcutaneous Q8H  . insulin aspart  0-15 Units Subcutaneous Q4H  . insulin NPH Human  20 Units Subcutaneous QAC breakfast  . levothyroxine  62.5 mcg Intravenous Q0600  . mouth rinse  15 mL Mouth Rinse BID  . tamsulosin  0.4 mg Oral Q1200   Continuous Infusions: . lactated ringers 100 mL/hr at 07/17/18 0729   PRN Meds:.acetaminophen **OR** acetaminophen, bisacodyl, haloperidol lactate, hydrALAZINE  Antibiotics  :   Anti-infectives (From admission, onward)   Start     Dose/Rate Route Frequency Ordered Stop   07/16/18 1700  levofloxacin (LEVAQUIN) IVPB 500 mg      500 mg 100 mL/hr over 60 Minutes Intravenous  Once 07/16/18 1656 07/16/18 2205          Objective:   Vitals:   07/17/18 0347 07/17/18 0514 07/17/18 0732 07/17/18 1100  BP:   120/71 99/64  Pulse:   76 80  Resp:   16 18  Temp: (!) 97.4 F (36.3 C) 98 F (36.7 C) 97.8 F (36.6 C) 98.8 F (37.1 C)  TempSrc: Axillary Oral Axillary Oral  SpO2:   98% 93%  Weight: 101.3 kg     Height:        Wt Readings from Last 3 Encounters:  07/17/18 101.3 kg  03/04/16 109.5 kg  02/25/15 97 kg     Intake/Output Summary (Last 24 hours) at 07/17/2018 1317 Last data filed at 07/17/2018 0647 Gross per 24 hour  Intake 3294.03 ml  Output 600 ml  Net 2694.03 ml     Physical Exam  Somnolent but arousable, unable to follow commands, moving all 4 extremities to painful stimuli [place].AT,PERRAL Supple Neck,No JVD, No cervical lymphadenopathy appriciated.  Symmetrical Chest wall movement, Good air movement bilaterally, CTAB RRR,No Gallops,Rubs or new Murmurs, No Parasternal Heave +ve B.Sounds, Abd Soft, No tenderness, No organomegaly appriciated, No rebound - guarding or rigidity. No Cyanosis, Clubbing or edema, No new Rash or bruise       Data Review:    CBC Recent Labs  Lab 07/16/18 1452 07/16/18 1458 07/16/18 1858 07/17/18 0255  WBC 11.2*  --  12.3* 10.1  HGB 13.6 14.3 12.9* 12.3*  HCT 44.1 42.0 41.3 39.9  PLT 185  --  182 166  MCV 100.2*  --  99.3 99.3  MCH 30.9  --  31.0 30.6  MCHC 30.8  --  31.2 30.8  RDW 13.2  --  13.4 13.3  LYMPHSABS 0.5*  --   --   --   MONOABS 1.0  --   --   --   EOSABS 0.1  --   --   --   BASOSABS 0.1  --   --   --     Chemistries  Recent Labs  Lab 07/16/18 1452 07/16/18 1458 07/16/18 1858 07/17/18 0255  NA 140 139  --  141  K 4.1 4.2  --  3.9  CL 97* 98  --  99  CO2 30  --   --  28  GLUCOSE 175* 180*  --  151*  BUN 33* 38*  --  32*  CREATININE 3.85* 3.80* 3.65* 3.27*  CALCIUM 8.7*  --   --  8.4*  AST 187*  --   --  171*  ALT 61*  --    --  55*  ALKPHOS 101  --   --  90  BILITOT 1.3*  --   --  0.9   ------------------------------------------------------------------------------------------------------------------ No results for input(s): CHOL, HDL, LDLCALC, TRIG, CHOLHDL, LDLDIRECT in the last 72 hours.  Lab Results  Component Value Date   HGBA1C 7.5 (H) 07/16/2018   ------------------------------------------------------------------------------------------------------------------ Recent Labs    07/16/18 1858  TSH 87.688*   ------------------------------------------------------------------------------------------------------------------ No results for input(s): VITAMINB12, FOLATE, FERRITIN, TIBC, IRON, RETICCTPCT in the last 72 hours.  Coagulation profile Recent Labs  Lab 07/16/18 1452 07/16/18 1858  INR 1.07 0.98    No results for input(s): DDIMER in the last 72 hours.  Cardiac Enzymes No results for input(s): CKMB, TROPONINI, MYOGLOBIN in the last 168 hours.  Invalid input(s): CK ------------------------------------------------------------------------------------------------------------------    Component Value Date/Time   BNP 200.6 (H) 01/18/2015 1035    Micro Results Recent Results (from the past 240 hour(s))  MRSA PCR Screening     Status: None   Collection Time: 07/16/18  8:03 PM  Result Value Ref Range Status   MRSA by PCR NEGATIVE NEGATIVE Final    Comment:        The GeneXpert MRSA Assay (FDA approved for NASAL specimens only), is one component of a comprehensive MRSA colonization surveillance program. It is not intended to diagnose MRSA infection nor to guide or monitor treatment for MRSA infections. Performed at [facility], [place] [address], [place], [place] [address]     Radiology Reports Ct Head Wo Contrast  Result Date: 07/16/2018 CLINICAL DATA:  78 year old male with history of trauma. Altered mental status. Found down. EXAM: CT HEAD WITHOUT CONTRAST CT CERVICAL  SPINE WITHOUT CONTRAST TECHNIQUE: Multidetector CT imaging of the head and cervical spine was performed following the standard protocol without intravenous contrast. Multiplanar CT image reconstructions of the cervical spine were also generated. COMPARISON:  Head CT 03/07/2017.  Cervical spine CT 03/03/2016. FINDINGS: CT HEAD FINDINGS Brain: Mild cerebral atrophy. Patchy and confluent areas of decreased attenuation are noted throughout the deep and periventricular white matter of the cerebral hemispheres bilaterally, compatible with chronic microvascular ischemic disease. No evidence of acute infarction, hemorrhage, hydrocephalus, extra-axial collection or mass lesion/mass effect. Vascular: No hyperdense vessel or unexpected calcification. Skull: Normal. Negative for fracture or focal lesion. Sinuses/Orbits: Small mucosal retention cyst or polyp in the anterior aspect of the left maxillary sinus incidentally noted. No  acute finding. Other: None. CT CERVICAL SPINE FINDINGS Alignment: Exaggerated lordosis at C2-C3, likely positional. Alignment is otherwise anatomic. Skull base and vertebrae: Status post ACDF at C3-C4 with complete bony fusion at this level. No acute fracture. No primary bone lesion or focal pathologic process. Soft tissues and spinal canal: No prevertebral fluid or swelling. No visible canal hematoma. Disc levels: Multilevel degenerative disc disease, most severe at C6-C7 and T1-T2. Moderate multilevel facet arthropathy. Upper chest: Emphysema. Other: None. IMPRESSION: 1. No evidence of significant acute traumatic injury to the skull, brain or cervical spine. 2. Mild cerebral atrophy with chronic microvascular ischemic changes in the cerebral white matter. 3. Multilevel degenerative disc disease and cervical spondylosis, with postoperative changes of ACDF at C3-C4, as above. Electronically Signed   By: Vinnie Langton M.D.   On: 07/16/2018 16:24   Ct Cervical Spine Wo Contrast  Result Date:  07/16/2018 CLINICAL DATA:  78 year old male with history of trauma. Altered mental status. Found down. EXAM: CT HEAD WITHOUT CONTRAST CT CERVICAL SPINE WITHOUT CONTRAST TECHNIQUE: Multidetector CT imaging of the head and cervical spine was performed following the standard protocol without intravenous contrast. Multiplanar CT image reconstructions of the cervical spine were also generated. COMPARISON:  Head CT 03/07/2017.  Cervical spine CT 03/03/2016. FINDINGS: CT HEAD FINDINGS Brain: Mild cerebral atrophy. Patchy and confluent areas of decreased attenuation are noted throughout the deep and periventricular white matter of the cerebral hemispheres bilaterally, compatible with chronic microvascular ischemic disease. No evidence of acute infarction, hemorrhage, hydrocephalus, extra-axial collection or mass lesion/mass effect. Vascular: No hyperdense vessel or unexpected calcification. Skull: Normal. Negative for fracture or focal lesion. Sinuses/Orbits: Small mucosal retention cyst or polyp in the anterior aspect of the left maxillary sinus incidentally noted. No acute finding. Other: None. CT CERVICAL SPINE FINDINGS Alignment: Exaggerated lordosis at C2-C3, likely positional. Alignment is otherwise anatomic. Skull base and vertebrae: Status post ACDF at C3-C4 with complete bony fusion at this level. No acute fracture. No primary bone lesion or focal pathologic process. Soft tissues and spinal canal: No prevertebral fluid or swelling. No visible canal hematoma. Disc levels: Multilevel degenerative disc disease, most severe at C6-C7 and T1-T2. Moderate multilevel facet arthropathy. Upper chest: Emphysema. Other: None. IMPRESSION: 1. No evidence of significant acute traumatic injury to the skull, brain or cervical spine. 2. Mild cerebral atrophy with chronic microvascular ischemic changes in the cerebral white matter. 3. Multilevel degenerative disc disease and cervical spondylosis, with postoperative changes of ACDF at  C3-C4, as above. Electronically Signed   By: Vinnie Langton M.D.   On: 07/16/2018 16:24   Mr Brain Wo Contrast  Result Date: 07/17/2018 CLINICAL DATA:  Altered level of consciousness, unexplained. Encephalopathy. EXAM: MRI HEAD WITHOUT CONTRAST TECHNIQUE: Multiplanar, multiecho pulse sequences of the brain and surrounding structures were obtained without intravenous contrast. COMPARISON:  CT head without contrast 07/16/2018 FINDINGS: Brain: Study is mildly degraded by patient motion. Mild generalized atrophy and white matter disease is within normal limits for age. No acute infarct, hemorrhage, or mass lesion is present. The ventricles are of proportionate to the degree of atrophy. No significant extraaxial fluid collection is present. The internal auditory canals are within normal limits. The brainstem and cerebellum are within normal limits. Vascular: Flow is present in the major intracranial arteries. Skull and upper cervical spine: Cervical fusion is noted at C3-4. There is slight retrolisthesis and disc disease at C2-3. The craniocervical junction is normal. Midline structures are unremarkable otherwise. Exaggerated cervical lordosis is noted. Sinuses/Orbits: The  paranasal sinuses and right mastoid air cells are clear. A left mastoid effusion is present. No obstructing nasopharyngeal lesion is present. The globes and orbits are within normal limits. IMPRESSION: 1. Normal MRI appearance of the brain for age. No acute or focal abnormality to explain the patient's symptoms. 2. Left mastoid effusion. No obstructing nasopharyngeal lesion is present. 3. Degenerative changes of the cervical spine adjacent level disease at C2-3 following fusion at C3-4. Electronically Signed   By: San Morelle M.D.   On: 07/17/2018 12:29   US Renal  Result Date: 07/16/2018 CLINICAL DATA:  Acute renal failure EXAM: RENAL / URINARY TRACT ULTRASOUND COMPLETE COMPARISON:  None. FINDINGS: Right Kidney: Renal measurements:  10.3 x 4.9 x 5.3 cm = volume: 139 mL. Diffuse renal parenchymal atrophy. No hydronephrosis or mass lesion identified. Left Kidney: Renal measurements: 8.7 x 4.8 x 5 cm = volume: 109 mL. Limited visualization. Lower pole is obscured by overlying bowel gas. There appears to be diffuse renal parenchymal atrophy. No mass or hydronephrosis demonstrated as visualized. Bladder: Bladder is not visualized, completely decompressed. IMPRESSION: Bilateral diffuse renal parenchymal atrophy.  No hydronephrosis. Electronically Signed   By: Lucienne Capers M.D.   On: 07/16/2018 20:19   Dg Chest Port 1 View  Result Date: 07/16/2018 CLINICAL DATA:  Patient found down by family. EXAM: PORTABLE CHEST 1 VIEW COMPARISON:  03/07/2017 FINDINGS: The heart size and mediastinal contours are within normal limits. Aortic atherosclerosis is noted at the arch, stable in appearance without aneurysm. Both lungs are clear. The visualized skeletal structures are unremarkable. IMPRESSION: No active disease. Electronically Signed   By: Ashley Royalty M.D.   On: 07/16/2018 14:56   Dg Knee Complete 4 Views Left  Result Date: 07/16/2018 CLINICAL DATA:  Left knee swelling. Patient found down today. EXAM: LEFT KNEE - COMPLETE 4+ VIEW COMPARISON:  03/04/2016 FINDINGS: Tricompartmental osteoarthritis of the knee is redemonstrated with moderate femorotibial and patellofemoral joint space narrowing and spurring identified. Cortical irregularity of the anterior aspect of the medial femoral condyle is seen on the lateral view which appears more likely to be chronic as it appears likely present on the previous exam though more obscured by overlap from the patient's patella. A moderate-sized suprapatellar joint effusion is identified on current exam, increased from prior without fat fluid level. IMPRESSION: 1. 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 2. Cortical irregularity of the anterior aspect of the medial femoral  condyle is seen on the lateral view which appears more likely to be chronic as it appears more obscured by overlap from the patient's patella. If there is pain out of proportion to radiographic findings however, consider cross-sectional imaging such as CT for better assessment. - Electronically Signed   By: Ashley Royalty M.D.   On: 07/16/2018 17:09    Time Spent in minutes  30   Lala Lund M.D on 07/17/2018 at 1:17 PM  To page go to [email or web address] - password [facility]

## 2018-07-17 NOTE — Consult Note (Signed)
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 Meredith Staggers is an 78 y.o. male.  HPI: Patient was admitted to the hospital after being found down.  Was found to have severe hypothyroidism.  His mental status has been obtunded.  He was noted by hospitalist team to have knee effusions.  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  He did have some superficial abrasions about the knees.  Orthopedics was consulted to evaluate need for aspiration.  He had a white count of 11.7 today.  He was afebrile.  On my evaluation the patient does not respond to questioning.  Physical therapy is and they are attempting to work with him and he has not following commands.  He opens his eyes and speaks but inappropriately so.  Past Medical History:  Diagnosis Date  . COPD (chronic obstructive pulmonary disease) ([place])   . Diabetes mellitus without complication ([place])   . Hypertension   . Hypothyroidism   . Shortness of breath   . TIA (transient ischemic attack)     Past Surgical History:  Procedure Laterality Date  . COLONOSCOPY N/A 09/23/2013   Procedure: COLONOSCOPY;  Surgeon: Beryle Beams, MD;  Location: [facility];  Service: Endoscopy;  Laterality: N/A;  . ESOPHAGOGASTRODUODENOSCOPY N/A 09/23/2013   Procedure: ESOPHAGOGASTRODUODENOSCOPY (EGD);  Surgeon: Beryle Beams, MD;  Location: [facility];  Service: Endoscopy;  Laterality: N/A;  . GIVENS CAPSULE STUDY N/A 10/07/2013   Procedure: GIVENS CAPSULE STUDY;  Surgeon: Beryle Beams, MD;  Location: [facility];  Service: Endoscopy;  Laterality: N/A;  . surgery for bladder cancer  yrs ago    Family History  Problem Relation Age of Onset  . Hypertension Other     Social History:  reports that he has never smoked. His smokeless tobacco use includes chew. He reports that he does not drink alcohol or use drugs.  Allergies:  Allergies  Allergen Reactions   . Prednisone     REACTION: Reaction not known    Medications: I have reviewed the patient's current medications.  Results for orders placed or performed during the hospital encounter of 07/16/18 (from the past 48 hour(s))  Comprehensive metabolic panel     Status: Abnormal   Collection Time: 07/16/18  2:52 PM  Result Value Ref Range   Sodium 140 135 - 145 mmol/L   Potassium 4.1 3.5 - 5.1 mmol/L   Chloride 97 (L) 98 - 111 mmol/L   CO2 30 22 - 32 mmol/L   Glucose, Bld 175 (H) 70 - 99 mg/dL   BUN 33 (H) 8 - 23 mg/dL   Creatinine, Ser 3.85 (H) 0.61 - 1.24 mg/dL   Calcium 8.7 (L) 8.9 - 10.3 mg/dL   Total Protein 7.4 6.5 - 8.1 g/dL   Albumin 4.2 3.5 - 5.0 g/dL   AST 187 (H) 15 - 41 U/L   ALT 61 (H) 0 - 44 U/L   Alkaline Phosphatase 101 38 - 126 U/L   Total Bilirubin 1.3 (H) 0.3 - 1.2 mg/dL   GFR calc non Af Amer 14 (L) >60 mL/min   GFR calc Af Amer 17 (L) >60 mL/min   Anion gap 13 5 - 15    Comment: Performed at [facility], [address], [place], [place] [address]  Ethanol     Status: None   Collection Time: 07/16/18  2:52 PM  Result Value Ref Range   Alcohol, Ethyl (B) <10 <10  mg/dL    Comment: (NOTE) Lowest detectable limit for serum alcohol is 10 mg/dL. For medical purposes only. Performed at [facility], [place] [address], [place], [place] [address]   Acetaminophen level     Status: Abnormal   Collection Time: 07/16/18  2:52 PM  Result Value Ref Range   Acetaminophen (Tylenol), Serum <10 (L) 10 - 30 ug/mL    Comment: (NOTE) Therapeutic concentrations vary significantly. A range of 10-30 ug/mL  may be an effective concentration for many patients. However, some  are best treated at concentrations outside of this range. Acetaminophen concentrations >150 ug/mL at 4 hours after ingestion  and >50 ug/mL at 12 hours after ingestion are often associated with  toxic reactions. Performed at [facility], [place] [address], [place], [place] [address]    Salicylate level     Status: None   Collection Time: 07/16/18  2:52 PM  Result Value Ref Range   Salicylate Lvl <3.4 2.8 - 30.0 mg/dL    Comment: Performed at [facility] [address] [place], [place] [address]  CBC with Differential     Status: Abnormal   Collection Time: 07/16/18  2:52 PM  Result Value Ref Range   WBC 11.2 (H) 4.0 - 10.5 K/uL   RBC 4.40 4.22 - 5.81 MIL/uL   Hemoglobin 13.6 13.0 - 17.0 g/dL   HCT 44.1 39.0 - 52.0 %   MCV 100.2 (H) 80.0 - 100.0 fL   MCH 30.9 26.0 - 34.0 pg   MCHC 30.8 30.0 - 36.0 g/dL   RDW 13.2 11.5 - 15.5 %   Platelets 185 150 - 400 K/uL   nRBC 0.0 0.0 - 0.2 %   Neutrophils Relative % 85 %   Neutro Abs 9.5 (H) 1.7 - 7.7 K/uL   Lymphocytes Relative 4 %   Lymphs Abs 0.5 (L) 0.7 - 4.0 K/uL   Monocytes Relative 9 %   Monocytes Absolute 1.0 0.1 - 1.0 K/uL   Eosinophils Relative 1 %   Eosinophils Absolute 0.1 0.0 - 0.5 K/uL   Basophils Relative 0 %   Basophils Absolute 0.1 0.0 - 0.1 K/uL   Immature Granulocytes 1 %   Abs Immature Granulocytes 0.08 (H) 0.00 - 0.07 K/uL    Comment: Performed at [facility] [address], [place], [place] [address]  Protime-INR     Status: None   Collection Time: 07/16/18  2:52 PM  Result Value Ref Range   Prothrombin Time 13.8 11.4 - 15.2 seconds   INR 1.07     Comment: Performed at [facility], [place] [address], [place], [place] [address]  CK     Status: Abnormal   Collection Time: 07/16/18  2:52 PM  Result Value Ref Range   Total CK 7,876 (H) 49 - 397 U/L    Comment: RESULTS CONFIRMED BY MANUAL DILUTION Performed at [facility], [place] [address], [place],  [address]   I-stat troponin, ED     Status: None   Collection Time: 07/16/18  2:56 PM  Result Value Ref Range   Troponin i, poc 0.00 0.00 - 0.08 ng/mL   Comment 3            Comment: Due to the release kinetics of cTnI, a negative result within the first hours of the onset of symptoms does not rule  out myocardial infarction with certainty. If myocardial infarction is still suspected, repeat the test at appropriate  intervals.   I-stat Chem 8, ED     Status: Abnormal   Collection Time: 07/16/18  2:58 PM  Result Value Ref Range   Sodium 139 135 - 145 mmol/L   Potassium 4.2 3.5 - 5.1 mmol/L   Chloride 98 98 - 111 mmol/L   BUN 38 (H) 8 - 23 mg/dL   Creatinine, Ser 3.80 (H) 0.61 - 1.24 mg/dL   Glucose, Bld 180 (H) 70 - 99 mg/dL   Calcium, Ion 1.01 (L) 1.15 - 1.40 mmol/L   TCO2 34 (H) 22 - 32 mmol/L   Hemoglobin 14.3 13.0 - 17.0 g/dL   HCT 42.0 39.0 - 52.0 %  I-Stat CG4 Lactic Acid, ED     Status: None   Collection Time: 07/16/18  2:58 PM  Result Value Ref Range   Lactic Acid, Venous 1.90 0.5 - 1.9 mmol/L  I-Stat venous blood gas, ED     Status: Abnormal   Collection Time: 07/16/18  2:59 PM  Result Value Ref Range   pH, Ven 7.313 7.250 - 7.430   pCO2, Ven 70.6 (HH) 44.0 - 60.0 mmHg   pO2, Ven 25.0 (LL) 32.0 - 45.0 mmHg   Bicarbonate 35.8 (H) 20.0 - 28.0 mmol/L   TCO2 38 (H) 22 - 32 mmol/L   O2 Saturation 38.0 %   Acid-Base Excess 6.0 (H) 0.0 - 2.0 mmol/L   Patient temperature HIDE    Sample type VENOUS    Comment NOTIFIED PHYSICIAN   Influenza panel by PCR (type A & B)     Status: None   Collection Time: 07/16/18  4:58 PM  Result Value Ref Range   Influenza A By PCR NEGATIVE NEGATIVE   Influenza B By PCR NEGATIVE NEGATIVE    Comment: (NOTE) The Xpert Xpress Flu assay is intended as an aid in the diagnosis of  influenza and should not be used as a sole basis for treatment.  This  assay is FDA approved for nasopharyngeal swab specimens only. Nasal  washings and aspirates are unacceptable for Xpert Xpress Flu testing. Performed at [facility], [place] [address], [place], [place] [address]   I-Stat arterial blood gas, ED     Status: Abnormal   Collection Time: 07/16/18  5:05 PM  Result Value Ref Range   pH, Arterial 7.404 7.350 - 7.450   pCO2 arterial 55.8 (H) 32.0  - 48.0 mmHg   pO2, Arterial 176.0 (H) 83.0 - 108.0 mmHg   Bicarbonate 34.9 (H) 20.0 - 28.0 mmol/L   TCO2 37 (H) 22 - 32 mmol/L   O2 Saturation 100.0 %   Acid-Base Excess 8.0 (H) 0.0 - 2.0 mmol/L   Patient temperature 98.6 F    Collection site RADIAL, ALLEN'S TEST ACCEPTABLE    Drawn by Operator    Sample type ARTERIAL   Urinalysis, Routine w reflex microscopic     Status: Abnormal   Collection Time: 07/16/18  5:21 PM  Result Value Ref Range   Color, Urine YELLOW YELLOW   APPearance CLEAR CLEAR   Specific Gravity, Urine 1.021 1.005 - 1.030   pH 5.0 5.0 - 8.0   Glucose, UA NEGATIVE NEGATIVE mg/dL   Hgb urine dipstick SMALL (A) NEGATIVE   Bilirubin Urine NEGATIVE NEGATIVE   Ketones, ur NEGATIVE NEGATIVE mg/dL   Protein, ur NEGATIVE NEGATIVE mg/dL   Nitrite NEGATIVE NEGATIVE   Leukocytes, UA NEGATIVE NEGATIVE   RBC / HPF 6-10 0 - 5 RBC/hpf   WBC, UA 0-5 0 - 5  WBC/hpf   Bacteria, UA RARE (A) NONE SEEN   Squamous Epithelial / LPF 0-5 0 - 5   Mucus PRESENT    Hyaline Casts, UA PRESENT     Comment: Performed at [facility], [place] [address], [place], [place] [address]  Ammonia     Status: None   Collection Time: 07/16/18  6:58 PM  Result Value Ref Range   Ammonia 23 9 - 35 umol/L    Comment: Performed at [facility], [place] [address], [place], [place] [address]  TSH     Status: Abnormal   Collection Time: 07/16/18  6:58 PM  Result Value Ref Range   TSH 87.688 (H) 0.350 - 4.500 uIU/mL    Comment: Performed by a 3rd Generation assay with a functional sensitivity of <=0.01 uIU/mL. Performed at [facility], [place] [address], [place], [place] [address]   Hemoglobin A1c     Status: Abnormal   Collection Time: 07/16/18  6:58 PM  Result Value Ref Range   Hgb A1c MFr Bld 7.5 (H) 4.8 - 5.6 %    Comment: (NOTE) Pre diabetes:          5.7%-6.4% Diabetes:              >6.4% Glycemic control for   <7.0% adults with diabetes    Mean Plasma Glucose 168.55 mg/dL     Comment: Performed at [facility] [address], [place], [place] [address]  CBC     Status: Abnormal   Collection Time: 07/16/18  6:58 PM  Result Value Ref Range   WBC 12.3 (H) 4.0 - 10.5 K/uL   RBC 4.16 (L) 4.22 - 5.81 MIL/uL   Hemoglobin 12.9 (L) 13.0 - 17.0 g/dL   HCT 41.3 39.0 - 52.0 %   MCV 99.3 80.0 - 100.0 fL   MCH 31.0 26.0 - 34.0 pg   MCHC 31.2 30.0 - 36.0 g/dL   RDW 13.4 11.5 - 15.5 %   Platelets 182 150 - 400 K/uL   nRBC 0.0 0.0 - 0.2 %    Comment: Performed at [facility], [place] [address], [place], [place] [address]  Creatinine, serum     Status: Abnormal   Collection Time: 07/16/18  6:58 PM  Result Value Ref Range   Creatinine, Ser 3.65 (H) 0.61 - 1.24 mg/dL   GFR calc non Af Amer 15 (L) >60 mL/min   GFR calc Af Amer 18 (L) >60 mL/min    Comment: Performed at [facility] [address], [place], [place] [address]  Protime-INR     Status: None   Collection Time: 07/16/18  6:58 PM  Result Value Ref Range   Prothrombin Time 12.9 11.4 - 15.2 seconds   INR 0.98     Comment: Performed at [facility] [address] [place], [place] [address]  Glucose, capillary     Status: Abnormal   Collection Time: 07/16/18  7:18 PM  Result Value Ref Range   Glucose-Capillary 172 (H) 70 - 99 mg/dL  MRSA PCR Screening     Status: None   Collection Time: 07/16/18  8:03 PM  Result Value Ref Range   MRSA by PCR NEGATIVE NEGATIVE    Comment:        The GeneXpert MRSA Assay (FDA approved for NASAL specimens only), is one component of a comprehensive MRSA colonization surveillance program. It is not intended to diagnose MRSA infection nor  to guide or monitor treatment for MRSA infections. Performed at [facility], [place] [address], [place], [place] [address]   Glucose, capillary     Status: Abnormal   Collection Time: 07/16/18 11:54 PM  Result Value Ref Range   Glucose-Capillary 155 (H) 70 - 99 mg/dL  Comprehensive metabolic panel     Status:  Abnormal   Collection Time: 07/17/18  2:55 AM  Result Value Ref Range   Sodium 141 135 - 145 mmol/L   Potassium 3.9 3.5 - 5.1 mmol/L   Chloride 99 98 - 111 mmol/L   CO2 28 22 - 32 mmol/L   Glucose, Bld 151 (H) 70 - 99 mg/dL   BUN 32 (H) 8 - 23 mg/dL   Creatinine, Ser 3.27 (H) 0.61 - 1.24 mg/dL   Calcium 8.4 (L) 8.9 - 10.3 mg/dL   Total Protein 6.9 6.5 - 8.1 g/dL   Albumin 3.6 3.5 - 5.0 g/dL   AST 171 (H) 15 - 41 U/L   ALT 55 (H) 0 - 44 U/L   Alkaline Phosphatase 90 38 - 126 U/L   Total Bilirubin 0.9 0.3 - 1.2 mg/dL   GFR calc non Af Amer 17 (L) >60 mL/min   GFR calc Af Amer 20 (L) >60 mL/min   Anion gap 14 5 - 15    Comment: Performed at [facility], [address], [place], [place] [address]  CBC     Status: Abnormal   Collection Time: 07/17/18  2:55 AM  Result Value Ref Range   WBC 10.1 4.0 - 10.5 K/uL   RBC 4.02 (L) 4.22 - 5.81 MIL/uL   Hemoglobin 12.3 (L) 13.0 - 17.0 g/dL   HCT 39.9 39.0 - 52.0 %   MCV 99.3 80.0 - 100.0 fL   MCH 30.6 26.0 - 34.0 pg   MCHC 30.8 30.0 - 36.0 g/dL   RDW 13.3 11.5 - 15.5 %   Platelets 166 150 - 400 K/uL   nRBC 0.0 0.0 - 0.2 %    Comment: Performed at [facility], [place] [address] [place], [place] [address]  Glucose, capillary     Status: Abnormal   Collection Time: 07/17/18  3:40 AM  Result Value Ref Range   Glucose-Capillary 148 (H) 70 - 99 mg/dL  Blood gas, arterial     Status: Abnormal   Collection Time: 07/17/18  6:37 AM  Result Value Ref Range   O2 Content 3.0 L/min   pH, Arterial 7.390 7.350 - 7.450   pCO2 arterial 52.1 (H) 32.0 - 48.0 mmHg   pO2, Arterial 68.2 (L) 83.0 - 108.0 mmHg   Bicarbonate 30.8 (H) 20.0 - 28.0 mmol/L   Acid-Base Excess 6.0 (H) 0.0 - 2.0 mmol/L   O2 Saturation 93.3 %   Patient temperature 98.6    Collection site LEFT RADIAL    Drawn by [id number]    Sample type ARTERIAL DRAW    Allens test (pass/fail) PASS PASS  Glucose, capillary     Status: Abnormal   Collection Time: 07/17/18  8:14 AM   Result Value Ref Range   Glucose-Capillary 137 (H) 70 - 99 mg/dL  Glucose, capillary     Status: Abnormal   Collection Time: 07/17/18 12:14 PM  Result Value Ref Range   Glucose-Capillary 143 (H) 70 - 99 mg/dL  Cortisol     Status: None   Collection Time: 07/17/18  1:36 PM  Result Value Ref Range   Cortisol, Plasma 17.6 ug/dL  Comment: (NOTE) AM    6.7 - 22.6 ug/dL PM   <10.0       ug/dL Performed at [facility] [address], [place], [place] [address]   T4, free     Status: Abnormal   Collection Time: 07/17/18  1:36 PM  Result Value Ref Range   Free T4 0.24 (L) 0.82 - 1.77 ng/dL    Comment: (NOTE) Biotin ingestion may interfere with free T4 tests. If the results are inconsistent with the TSH level, previous test results, or the clinical presentation, then consider biotin interference. If needed, order repeat testing after stopping biotin. Performed at [facility], [place] [address] [place], [place] [address]     Ct Head Wo Contrast  Result Date: 07/16/2018 CLINICAL DATA:  78 year old male with history of trauma. Altered mental status. Found down. EXAM: CT HEAD WITHOUT CONTRAST CT CERVICAL SPINE WITHOUT CONTRAST TECHNIQUE: Multidetector CT imaging of the head and cervical spine was performed following the standard protocol without intravenous contrast. Multiplanar CT image reconstructions of the cervical spine were also generated. COMPARISON:  Head CT 03/07/2017.  Cervical spine CT 03/03/2016. FINDINGS: CT HEAD FINDINGS Brain: Mild cerebral atrophy. Patchy and confluent areas of decreased attenuation are noted throughout the deep and periventricular white matter of the cerebral hemispheres bilaterally, compatible with chronic microvascular ischemic disease. No evidence of acute infarction, hemorrhage, hydrocephalus, extra-axial collection or mass lesion/mass effect. Vascular: No hyperdense vessel or unexpected calcification. Skull: Normal. Negative for fracture or  focal lesion. Sinuses/Orbits: Small mucosal retention cyst or polyp in the anterior aspect of the left maxillary sinus incidentally noted. No acute finding. Other: None. CT CERVICAL SPINE FINDINGS Alignment: Exaggerated lordosis at C2-C3, likely positional. Alignment is otherwise anatomic. Skull base and vertebrae: Status post ACDF at C3-C4 with complete bony fusion at this level. No acute fracture. No primary bone lesion or focal pathologic process. Soft tissues and spinal canal: No prevertebral fluid or swelling. No visible canal hematoma. Disc levels: Multilevel degenerative disc disease, most severe at C6-C7 and T1-T2. Moderate multilevel facet arthropathy. Upper chest: Emphysema. Other: None. IMPRESSION: 1. No evidence of significant acute traumatic injury to the skull, brain or cervical spine. 2. Mild cerebral atrophy with chronic microvascular ischemic changes in the cerebral white matter. 3. Multilevel degenerative disc disease and cervical spondylosis, with postoperative changes of ACDF at C3-C4, as above. Electronically Signed   By: Vinnie Langton M.D.   On: 07/16/2018 16:24   Ct Cervical Spine Wo Contrast  Result Date: 07/16/2018 CLINICAL DATA:  78 year old male with history of trauma. Altered mental status. Found down. EXAM: CT HEAD WITHOUT CONTRAST CT CERVICAL SPINE WITHOUT CONTRAST TECHNIQUE: Multidetector CT imaging of the head and cervical spine was performed following the standard protocol without intravenous contrast. Multiplanar CT image reconstructions of the cervical spine were also generated. COMPARISON:  Head CT 03/07/2017.  Cervical spine CT 03/03/2016. FINDINGS: CT HEAD FINDINGS Brain: Mild cerebral atrophy. Patchy and confluent areas of decreased attenuation are noted throughout the deep and periventricular white matter of the cerebral hemispheres bilaterally, compatible with chronic microvascular ischemic disease. No evidence of acute infarction, hemorrhage, hydrocephalus,  extra-axial collection or mass lesion/mass effect. Vascular: No hyperdense vessel or unexpected calcification. Skull: Normal. Negative for fracture or focal lesion. Sinuses/Orbits: Small mucosal retention cyst or polyp in the anterior aspect of the left maxillary sinus incidentally noted. No acute finding. Other: None. CT CERVICAL SPINE FINDINGS Alignment: Exaggerated lordosis at C2-C3, likely positional. Alignment is otherwise anatomic. Skull  base and vertebrae: Status post ACDF at C3-C4 with complete bony fusion at this level. No acute fracture. No primary bone lesion or focal pathologic process. Soft tissues and spinal canal: No prevertebral fluid or swelling. No visible canal hematoma. Disc levels: Multilevel degenerative disc disease, most severe at C6-C7 and T1-T2. Moderate multilevel facet arthropathy. Upper chest: Emphysema. Other: None. IMPRESSION: 1. No evidence of significant acute traumatic injury to the skull, brain or cervical spine. 2. Mild cerebral atrophy with chronic microvascular ischemic changes in the cerebral white matter. 3. Multilevel degenerative disc disease and cervical spondylosis, with postoperative changes of ACDF at C3-C4, as above. Electronically Signed   By: Vinnie Langton M.D.   On: 07/16/2018 16:24   Mr Brain Wo Contrast  Result Date: 07/17/2018 CLINICAL DATA:  Altered level of consciousness, unexplained. Encephalopathy. EXAM: MRI HEAD WITHOUT CONTRAST TECHNIQUE: Multiplanar, multiecho pulse sequences of the brain and surrounding structures were obtained without intravenous contrast. COMPARISON:  CT head without contrast 07/16/2018 FINDINGS: Brain: Study is mildly degraded by patient motion. Mild generalized atrophy and white matter disease is within normal limits for age. No acute infarct, hemorrhage, or mass lesion is present. The ventricles are of proportionate to the degree of atrophy. No significant extraaxial fluid collection is present. The internal auditory canals are  within normal limits. The brainstem and cerebellum are within normal limits. Vascular: Flow is present in the major intracranial arteries. Skull and upper cervical spine: Cervical fusion is noted at C3-4. There is slight retrolisthesis and disc disease at C2-3. The craniocervical junction is normal. Midline structures are unremarkable otherwise. Exaggerated cervical lordosis is noted. Sinuses/Orbits: The paranasal sinuses and right mastoid air cells are clear. A left mastoid effusion is present. No obstructing nasopharyngeal lesion is present. The globes and orbits are within normal limits. IMPRESSION: 1. Normal MRI appearance of the brain for age. No acute or focal abnormality to explain the patient's symptoms. 2. Left mastoid effusion. No obstructing nasopharyngeal lesion is present. 3. Degenerative changes of the cervical spine adjacent level disease at C2-3 following fusion at C3-4. Electronically Signed   By: San Morelle M.D.   On: 07/17/2018 12:29   US Renal  Result Date: 07/16/2018 CLINICAL DATA:  Acute renal failure EXAM: RENAL / URINARY TRACT ULTRASOUND COMPLETE COMPARISON:  None. FINDINGS: Right Kidney: Renal measurements: 10.3 x 4.9 x 5.3 cm = volume: 139 mL. Diffuse renal parenchymal atrophy. No hydronephrosis or mass lesion identified. Left Kidney: Renal measurements: 8.7 x 4.8 x 5 cm = volume: 109 mL. Limited visualization. Lower pole is obscured by overlying bowel gas. There appears to be diffuse renal parenchymal atrophy. No mass or hydronephrosis demonstrated as visualized. Bladder: Bladder is not visualized, completely decompressed. IMPRESSION: Bilateral diffuse renal parenchymal atrophy.  No hydronephrosis. Electronically Signed   By: Lucienne Capers M.D.   On: 07/16/2018 20:19   Dg Chest Port 1 View  Result Date: 07/16/2018 CLINICAL DATA:  Patient found down by family. EXAM: PORTABLE CHEST 1 VIEW COMPARISON:  03/07/2017 FINDINGS: The heart size and mediastinal contours are  within normal limits. Aortic atherosclerosis is noted at the arch, stable in appearance without aneurysm. Both lungs are clear. The visualized skeletal structures are unremarkable. IMPRESSION: No active disease. Electronically Signed   By: Ashley Royalty M.D.   On: 07/16/2018 14:56   Dg Knee Complete 4 Views Left  Result Date: 07/16/2018 CLINICAL DATA:  Left knee swelling. Patient found down today. EXAM: LEFT KNEE - COMPLETE 4+ VIEW COMPARISON:  03/04/2016 FINDINGS: Tricompartmental  osteoarthritis of the knee is redemonstrated with moderate femorotibial and patellofemoral joint space narrowing and spurring identified. Cortical irregularity of the anterior aspect of the medial femoral condyle is seen on the lateral view which appears more likely to be chronic as it appears likely present on the previous exam though more obscured by overlap from the patient's patella. A moderate-sized suprapatellar joint effusion is identified on current exam, increased from prior without fat fluid level. IMPRESSION: 1. 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 2. Cortical irregularity of the anterior aspect of the medial femoral condyle is seen on the lateral view which appears more likely to be chronic as it appears more obscured by overlap from the patient's patella. If there is pain out of proportion to radiographic findings however, consider cross-sectional imaging such as CT for better assessment. - Electronically Signed   By: Ashley Royalty M.D.   On: 07/16/2018 17:09    Review of Systems  Unable to perform ROS: Mental acuity  Gastrointestinal: Negative for diarrhea.   Blood pressure 99/64, pulse 80, temperature 98.8 F (37.1 C), temperature source Oral, resp. rate 18, height 6\' 1"  (1.854 m), weight 101.3 kg, SpO2 93 %. Physical Exam  Constitutional: He appears well-developed.  HENT:  Head: Normocephalic.  Eyes: Conjunctivae are normal.  Neck: Neck supple.  Cardiovascular:  Normal rate.  Respiratory: Effort normal.  GI: Soft.  Musculoskeletal:     Comments: Patient with effusions to bilateral knees.  Has some superficial abrasions.  No erythema about the knees.  No tenderness to palpation on the medial or lateral joint line albeit difficult to discern given mental acuity.  Does not display any grimacing with knee range of motion and full extension to flexion bilaterally.  No swelling about the ankles.  Patient refuses to comply with motor exam.  Unable to obtain sensation exam given his mental acuity.  Neurological:  Patient not following commands.  Not responding to questioning.  Skin: Skin is warm.    Assessment/Plan: At this point patient has bilateral knee effusions worse on the left than on the right.  He does have x-ray evidence of tricompartmental osteoarthritis.  His knees do not have any erythema.  He does not grimace or show any signs of discomfort with palpation or full knee range of motion.  He does not follow commands with active knee motion.  At this point I do do not feel like aspiration is necessary.  The risk of introducing bacteria outweighs the possible benefit of removing joint fluid.  Certainly if his knees become more swollen, erythematous or painful then we could reconsider aspiration.  Orthopedics will sign off.  Please feel free to reconsult if needed.  Erle Crocker 07/17/2018, 3:40 PM

## 2018-07-17 NOTE — Evaluation (Addendum)
Physical Therapy Evaluation Patient Details Name: Harold Martin MRN: [id number] DOB: 10/31/1940 Today's Date: 07/17/2018   History of Present Illness  Pt is a 78 y/o male admitted secondary to being found down and unresponsive. Pt found to have acute encephalopathy and severe dehydration. PMH including but not limited to severe COPD, hypothyroidism, insulin-dependent type 2 diabetes mellitus.    Clinical Impression  Pt presented supine in bed with HOB elevated, initially asleep and difficult to arouse and remained lethargic throughout. Pt with cognitive deficits noted and unable to provide any reliable information regarding home environment or PLOF. No family/caregivers present. Pt currently requires max A x2 for bed mobility and mod-max A to maintain upright sitting at EOB. Pt would continue to benefit from skilled physical therapy services at this time while admitted and after d/c to address the below listed limitations in order to improve overall safety and independence with functional mobility.  Of note, pt initially on 1L of supplemental O2 via [place]. At end of session, pt with desat to as low as 85% and required increased supplemental O2 to 3.5L to allow SPO2 to increase to low 90's. Pt's RN was notified.      Follow Up Recommendations SNF    Equipment Recommendations  None recommended by PT    Recommendations for Other Services       Precautions / Restrictions Precautions Precautions: Fall Restrictions Weight Bearing Restrictions: No      Mobility  Bed Mobility Overal bed mobility: Needs Assistance Bed Mobility: Supine to Sit;Sit to Supine     Supine to sit: Max assist;+2 for physical assistance Sit to supine: Max assist;+2 for physical assistance   General bed mobility comments: increased time and effort, assist needed with bilateral LE movement off of and back onto bed, heavy physical assist for trunk elevation  Transfers                 General transfer  comment: unable  Ambulation/Gait                Stairs            Wheelchair Mobility    Modified Rankin (Stroke Patients Only)       Balance Overall balance assessment: Needs assistance Sitting-balance support: Feet supported;Bilateral upper extremity supported;Single extremity supported Sitting balance-Leahy Scale: Poor Sitting balance - Comments: fluctuating between needing max A to mod A and one UE support                                     Pertinent Vitals/Pain Pain Assessment: Faces Faces Pain Scale: No hurt    Home Living Family/patient expects to be discharged to:: Unsure                 Additional Comments: pt not a reliable historian and no family/caregivers present to provide any information; per RN pt is from home alone    Prior Function           Comments: Unsure - pt not a reliable historian and no family/caregivers present to provide any information; per RN pt is from home alone     Hand Dominance        Extremity/Trunk Assessment   Upper Extremity Assessment Upper Extremity Assessment: Generalized weakness    Lower Extremity Assessment Lower Extremity Assessment: Generalized weakness    Cervical / Trunk Assessment Cervical / Trunk Assessment: Kyphotic  Communication  Communication: Expressive difficulties;HOH  Cognition Arousal/Alertness: Lethargic Behavior During Therapy: Flat affect Overall Cognitive Status: Impaired/Different from baseline Area of Impairment: Memory;Following commands;Safety/judgement;Problem solving                     Memory: Decreased short-term memory Following Commands: Follows one step commands inconsistently Safety/Judgement: Decreased awareness of deficits;Decreased awareness of safety   Problem Solving: Slow processing;Decreased initiation;Difficulty sequencing;Requires verbal cues;Requires tactile cues        General Comments      Exercises      Assessment/Plan    PT Assessment Patient needs continued PT services  PT Problem List Decreased strength;Decreased activity tolerance;Decreased balance;Decreased mobility;Decreased coordination;Decreased cognition;Decreased knowledge of use of DME;Decreased safety awareness;Decreased knowledge of precautions;Cardiopulmonary status limiting activity       PT Treatment Interventions DME instruction;Gait training;Functional mobility training;Therapeutic activities;Therapeutic exercise;Balance training;Neuromuscular re-education;Cognitive remediation;Patient/family education    PT Goals (Current goals can be found in the Care Plan section)  Acute Rehab PT Goals Patient Stated Goal: "just get it over with" PT Goal Formulation: Patient unable to participate in goal setting Time For Goal Achievement: 07/31/18 Potential to Achieve Goals: Fair    Frequency Min 2X/week   Barriers to discharge        Co-evaluation               AM-PAC PT "6 Clicks" Mobility  Outcome Measure Help needed turning from your back to your side while in a flat bed without using bedrails?: Total Help needed moving from lying on your back to sitting on the side of a flat bed without using bedrails?: Total Help needed moving to and from a bed to a chair (including a wheelchair)?: Total Help needed standing up from a chair using your arms (e.g., wheelchair or bedside chair)?: Total Help needed to walk in hospital room?: Total Help needed climbing 3-5 steps with a railing? : Total 6 Click Score: 6    End of Session   Activity Tolerance: Patient limited by fatigue;Patient limited by lethargy Patient left: in bed;with call bell/phone within reach;with bed alarm set Nurse Communication: Mobility status PT Visit Diagnosis: Other abnormalities of gait and mobility (R26.89);Muscle weakness (generalized) (M62.81)    Time: 2919-1660 PT Time Calculation (min) (ACUTE ONLY): 19 min   Charges:   PT  Evaluation $PT Eval Moderate Complexity: 1 Mod          Sherie Don, PT, DPT  Acute Rehabilitation Services Pager [phone] Office [place] 07/17/2018, 4:06 PM

## 2018-07-17 NOTE — Progress Notes (Signed)
EEG completed; results pending.    

## 2018-07-18 LAB — COMPREHENSIVE METABOLIC PANEL
ALT: 48 U/L — ABNORMAL HIGH (ref 0–44)
AST: 123 U/L — AB (ref 15–41)
Albumin: 3.4 g/dL — ABNORMAL LOW (ref 3.5–5.0)
Alkaline Phosphatase: 84 U/L (ref 38–126)
Anion gap: 11 (ref 5–15)
BUN: 31 mg/dL — ABNORMAL HIGH (ref 8–23)
CO2: 31 mmol/L (ref 22–32)
Calcium: 7.9 mg/dL — ABNORMAL LOW (ref 8.9–10.3)
Chloride: 98 mmol/L (ref 98–111)
Creatinine, Ser: 2.89 mg/dL — ABNORMAL HIGH (ref 0.61–1.24)
GFR calc Af Amer: 24 mL/min — ABNORMAL LOW (ref >60)
GFR calc non Af Amer: 20 mL/min — ABNORMAL LOW (ref >60)
Glucose, Bld: 183 mg/dL — ABNORMAL HIGH (ref 70–99)
Potassium: 4.2 mmol/L (ref 3.5–5.1)
Sodium: 140 mmol/L (ref 135–145)
Total Bilirubin: 1 mg/dL (ref 0.3–1.2)
Total Protein: 6.7 g/dL (ref 6.5–8.1)

## 2018-07-18 LAB — GLUCOSE, CAPILLARY
GLUCOSE-CAPILLARY: 158 mg/dL — AB (ref 70–99)
GLUCOSE-CAPILLARY: 142 mg/dL — AB (ref 70–99)
Glucose-Capillary: 155 mg/dL — ABNORMAL HIGH (ref 70–99)
Glucose-Capillary: 161 mg/dL — ABNORMAL HIGH (ref 70–99)
Glucose-Capillary: 184 mg/dL — ABNORMAL HIGH (ref 70–99)
Glucose-Capillary: 225 mg/dL — ABNORMAL HIGH (ref 70–99)

## 2018-07-18 LAB — CBC
HCT: 39 % (ref 39.0–52.0)
Hemoglobin: 12 g/dL — ABNORMAL LOW (ref 13.0–17.0)
MCH: 31.7 pg (ref 26.0–34.0)
MCHC: 30.8 g/dL (ref 30.0–36.0)
MCV: 103.2 fL — ABNORMAL HIGH (ref 80.0–100.0)
Platelets: 159 10*3/uL (ref 150–400)
RBC: 3.78 MIL/uL — ABNORMAL LOW (ref 4.22–5.81)
RDW: 13.6 % (ref 11.5–15.5)
WBC: 9.4 10*3/uL (ref 4.0–10.5)
nRBC: 0 % (ref 0.0–0.2)

## 2018-07-18 LAB — T3

## 2018-07-18 LAB — VITAMIN B12: Vitamin B-12: >7500 pg/mL — ABNORMAL HIGH (ref 180–914)

## 2018-07-18 LAB — T4, FREE: Free T4: <0.25 ng/dL — ABNORMAL LOW (ref 0.82–1.77)

## 2018-07-18 MED ORDER — FUROSEMIDE 10 MG/ML IJ SOLN
40.0000 mg | Freq: Once | INTRAMUSCULAR | Status: AC
  Administered 2018-07-18: 40 mg via INTRAVENOUS
  Filled 2018-07-18: qty 4

## 2018-07-18 MED ORDER — LEVOTHYROXINE SODIUM 100 MCG/5ML IV SOLN
150.0000 ug | Freq: Every day | INTRAVENOUS | Status: DC
  Administered 2018-07-18 – 2018-07-24 (×7): 150 ug via INTRAVENOUS
  Filled 2018-07-18 (×7): qty 10

## 2018-07-18 MED ORDER — DEXTROSE 5 % IV SOLN
INTRAVENOUS | Status: DC
  Administered 2018-07-18 – 2018-07-19 (×3): via INTRAVENOUS

## 2018-07-18 MED ORDER — METHYLPREDNISOLONE SODIUM SUCC 125 MG IJ SOLR
60.0000 mg | INTRAMUSCULAR | Status: AC
  Administered 2018-07-18: 60 mg via INTRAVENOUS
  Filled 2018-07-18: qty 2

## 2018-07-18 MED ORDER — FUROSEMIDE 10 MG/ML IJ SOLN
20.0000 mg | Freq: Once | INTRAMUSCULAR | Status: AC
  Administered 2018-07-18: 20 mg via INTRAVENOUS
  Filled 2018-07-18: qty 2

## 2018-07-18 MED ORDER — LEVOTHYROXINE SODIUM 100 MCG/5ML IV SOLN
50.0000 ug | Freq: Once | INTRAVENOUS | Status: AC
  Administered 2018-07-18: 50 ug via INTRAVENOUS
  Filled 2018-07-18: qty 5

## 2018-07-18 NOTE — Progress Notes (Signed)
Patient continues to be oriented to self only during shift. Repeatedly asking for water. Oral care with swabs provided PRN. Vital signs stable. Remains on [place] at 3L. Bed alarm on and audible. Denies any pain. Will continue to monitor.

## 2018-07-18 NOTE — Evaluation (Signed)
Clinical/Bedside Swallow Evaluation Patient Details  Name: Harold Martin MRN: [id number] Date of Birth: 1940/09/20  Today's Date: 07/18/2018 Time: SLP Start Time (ACUTE ONLY): 23 SLP Stop Time (ACUTE ONLY): 1423 SLP Time Calculation (min) (ACUTE ONLY): 14 min  Past Medical History:  Past Medical History:  Diagnosis Date  . COPD (chronic obstructive pulmonary disease) ([place])   . Diabetes mellitus without complication ([place])   . Hypertension   . Hypothyroidism   . Shortness of breath   . TIA (transient ischemic attack)    Past Surgical History:  Past Surgical History:  Procedure Laterality Date  . COLONOSCOPY N/A 09/23/2013   Procedure: COLONOSCOPY;  Surgeon: Beryle Beams, MD;  Location: [facility];  Service: Endoscopy;  Laterality: N/A;  . ESOPHAGOGASTRODUODENOSCOPY N/A 09/23/2013   Procedure: ESOPHAGOGASTRODUODENOSCOPY (EGD);  Surgeon: Beryle Beams, MD;  Location: [facility];  Service: Endoscopy;  Laterality: N/A;  . GIVENS CAPSULE STUDY N/A 10/07/2013   Procedure: GIVENS CAPSULE STUDY;  Surgeon: Beryle Beams, MD;  Location: [facility];  Service: Endoscopy;  Laterality: N/A;  . surgery for bladder cancer  yrs ago   HPI:  Harold Martin  is a 78 y.o. male, severe COPD, hypothyroidism, insulin-dependent type 2 diabetes mellitus who lives at home alone, still walks and ambulates by himself and drives a car, who had been suffering with mild cold for the last few days, found down unresponsive. Metabolic encephalopathy likely due to early myxedema coma. Had an episode of poor secretion management on night of 1/11. Pt has a history of dysphagia. MBS on 02/26/15 reprots Patient presents with a moderate pharyngeal phase dysphagia characterized by decreased movement of the hyolaryngeal complex and possible mild decrease in tight laryngeal closure due to presence of cervical hardware at C3-4 from previous surgery. Patient with deep silent penetration of thin liquids and moderate-severe  vallecular residuals post swallow which cued dry swallows assist to decrease. Recommended to consume dys 1/nectar at that time, discharged to SNF the next day.  No further notes present. Current CXR and MRI shows no acute disease.    Assessment / Plan / Recommendation Clinical Impression  Pt demonstrates significant signs of aspiration across consistencies under observation. He is additionally impulsive with congested breath sounds and has poor Harold hygiene. These findings in conjunction with history of dysphagia suggest high risk of aspiration with all textures and warrant instrumental assessment if agressive interventions are desired. Suspect that risk of aspiration will be present even with diet restriction and precautions so discussion regarding plan of care may be warranted. Will f/u tomorrow for MBS if desired.  SLP Visit Diagnosis: Dysphagia, oropharyngeal phase (R13.12)    Aspiration Risk  Severe aspiration risk    Diet Recommendation NPO        Other  Recommendations     Follow up Recommendations        Frequency and Duration            Prognosis        Swallow Study   General HPI: Harold Martin  is a 78 y.o. male, severe COPD, hypothyroidism, insulin-dependent type 2 diabetes mellitus who lives at home alone, still walks and ambulates by himself and drives a car, who had been suffering with mild cold for the last few days, found down unresponsive. Metabolic encephalopathy likely due to early myxedema coma. Had an episode of poor secretion management on night of 1/11. Pt has a history of dysphagia. MBS on 02/26/15 reprots Patient presents with a moderate pharyngeal  phase dysphagia characterized by decreased movement of the hyolaryngeal complex and possible mild decrease in tight laryngeal closure due to presence of cervical hardware at C3-4 from previous surgery. Patient with deep silent penetration of thin liquids and moderate-severe vallecular residuals post swallow which cued  dry swallows assist to decrease. Recommended to consume dys 1/nectar at that time, discharged to SNF the next day.  No further notes present. Current CXR and MRI shows no acute disease.  Type of Study: Bedside Swallow Evaluation Previous Swallow Assessment: see HPI Diet Prior to this Study: NPO Temperature Spikes Noted: No Respiratory Status: Nasal cannula History of Recent Intubation: No Behavior/Cognition: Alert Harold Cavity Assessment: Other (comment)(thick white borwn coating) Harold Care Completed by SLP: Recent completion by staff Harold Cavity - Dentition: Edentulous;Dentures, not available Vision: Functional for self-feeding Self-Feeding Abilities: Needs assist Patient Positioning: Postural control interferes with function Baseline Vocal Quality: Hoarse Volitional Cough: Congested Volitional Swallow: Able to elicit    Harold/Motor/Sensory Function Overall Harold Motor/Sensory Function: Within functional limits   Ice Chips     Thin Liquid Thin Liquid: Impaired Presentation: Cup;Self Fed Pharyngeal  Phase Impairments: Cough - Immediate;Multiple swallows;Wet Vocal Quality    Nectar Thick Nectar Thick Liquid: Impaired Presentation: Cup;Spoon Pharyngeal Phase Impairments: Cough - Immediate;Multiple swallows;Wet Vocal Quality   Honey Thick Honey Thick Liquid: Not tested   Puree Puree: Impaired Presentation: Spoon Pharyngeal Phase Impairments: Wet Vocal Quality;Multiple swallows;Cough - Delayed   Solid     Solid: Not tested      Harold Martin, Katherene Ponto 07/18/2018,2:32 PM

## 2018-07-18 NOTE — Progress Notes (Signed)
PROGRESS NOTE                                                                                                                                                                                                             Patient Demographics:    Harold Martin, is a 78 y.o. male, DOB - 26-Jul-1940, HCW:[id number]  Admit date - 07/16/2018   Admitting Physician Thurnell Lose, MD  Outpatient Primary MD for the patient is Anda Kraft, MD  LOS - 2  CC AMS     Brief Narrative   Harold Martin  is a 78 y.o. male, severe COPD, hypothyroidism, insulin-dependent type 2 diabetes mellitus who lives at home alone, still walks and ambulates by himself and drives a car, who had been suffering with mild cold for the last few days, was talking to his daughter on a daily basis last conversation 2 days ago, this afternoon when the daughter tried to get in touch with the patient she did not get any response, when she went to visit him which he was found down on the floor unresponsive.  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    Subjective:    .pkssub    Assessment  & Plan :     1.  Metabolic encephalopathy likely due to early myxedema coma.  Is clinically improving after initiation of IV Synthroid which will be continued, his free T4 was suppressed, he was on Synthroid and states he was compliant with it, baseline random cortisol was 17 which I think was appropriate, will continue steroid IV for 1 more day, once blood pressure stays stable along with electrolytes for another 24 hours we will discontinue steroids, will monitor TSH intermittently will require outpatient endocrine follow-up post discharge.  Advance activity, PT and speech to eval.  2.  Severe dehydration with hypernatremia and ARF.  Improving with hydration which will be continued, rhabdomyolysis is improving, he has  underlying CKD 4 with baseline creatinine around 2.5.  Renal ultrasound was nonacute.  Has Foley which will be continued for now.  If stable will discontinue Foley catheter tomorrow.  3.  Rhabdomyolysis.  Continue IV fluids to hydrate and monitor, clinically improved.  4.  COPD.  Initial blood gas was venous, ABG was stable.  No acute issues supportive care.  5.  Hypertension.  PRN hydralazine only for now.  6.  Left knee effusion.  X-ray is nonacute,  consulted orthopedics who did not think that at this time patient requires an arthrocentesis.  7. DM type II.  Now insulin-dependent.  Continue half home dose 7030 along with sliding scale and monitor.  Lab Results  Component Value Date   HGBA1C 7.5 (H) 07/16/2018   CBG (last 3)  Recent Labs    07/17/18 2343 07/18/18 0408 07/18/18 0741  GLUCAP 175* 158* 63*     Family Communication  :  Daughter at the time of admission  Code Status :  Full  Disposition Plan  :  TBD likely SNF  Consults  :  Ortho  Procedures  :    MRI - non acute  Renal US - CKD  EEG - Non acute, non specific changes   DVT Prophylaxis  :   Heparin   Lab Results  Component Value Date   PLT 159 07/18/2018    Diet :  Diet Order            Diet NPO time specified Except for: Sips with Meds  Diet effective now               Inpatient Medications Scheduled Meds: . aspirin  300 mg Rectal Daily  . chlorhexidine  15 mL Mouth Rinse BID  . heparin  5,000 Units Subcutaneous Q8H  . insulin aspart  0-15 Units Subcutaneous Q4H  . insulin NPH Human  20 Units Subcutaneous QAC breakfast  . levothyroxine  150 mcg Intravenous Q0600  . mouth rinse  15 mL Mouth Rinse BID  . methylPREDNISolone (SOLU-MEDROL) injection  60 mg Intravenous Q24H  . tamsulosin  0.4 mg Oral Q1200   Continuous Infusions: . dextrose 50 mL/hr at 07/18/18 0817   PRN Meds:.acetaminophen **OR** acetaminophen, bisacodyl, haloperidol lactate, hydrALAZINE  Antibiotics  :     Anti-infectives (From admission, onward)   Start     Dose/Rate Route Frequency Ordered Stop   07/16/18 1700  levofloxacin (LEVAQUIN) IVPB 500 mg     500 mg 100 mL/hr over 60 Minutes Intravenous  Once 07/16/18 1656 07/16/18 2205          Objective:   Vitals:   07/17/18 2344 07/18/18 0411 07/18/18 0743 07/18/18 0820  BP:    135/77  Pulse:    74  Resp:    15  Temp: 98.2 F (36.8 C) 98 F (36.7 C) (!) 97 F (36.1 C) 97.9 F (36.6 C)  TempSrc: Axillary  Axillary Oral  SpO2:    99%  Weight:      Height:        Wt Readings from Last 3 Encounters:  07/17/18 101.3 kg  03/04/16 109.5 kg  02/25/15 97 kg     Intake/Output Summary (Last 24 hours) at 07/18/2018 1123 Last data filed at 07/17/2018 1734 Gross per 24 hour  Intake 687.54 ml  Output 350 ml  Net 337.54 ml     Physical Exam  Awake more alert today, No new F.N deficits, Normal affect [place].AT,PERRAL Supple Neck,No JVD, No cervical lymphadenopathy appriciated.  Symmetrical Chest wall movement, Good air movement bilaterally, CTAB RRR,No Gallops, Rubs or new Murmurs, No Parasternal Heave +ve B.Sounds, Abd Soft, No tenderness, No organomegaly appriciated, No rebound - guarding or rigidity. No Cyanosis, Clubbing or edema, No new Rash or bruise, mild L. Knee effusion      Data Review:    CBC Recent Labs  Lab 07/16/18 1452 07/16/18 1458 07/16/18 1858 07/17/18 0255 07/18/18 0341  WBC 11.2*  --  12.3* 10.1 9.4  HGB 13.6 14.3 12.9* 12.3* 12.0*  HCT 44.1 42.0 41.3 39.9 39.0  PLT 185  --  182 166 159  MCV 100.2*  --  99.3 99.3 103.2*  MCH 30.9  --  31.0 30.6 31.7  MCHC 30.8  --  31.2 30.8 30.8  RDW 13.2  --  13.4 13.3 13.6  LYMPHSABS 0.5*  --   --   --   --   MONOABS 1.0  --   --   --   --   EOSABS 0.1  --   --   --   --   BASOSABS 0.1  --   --   --   --     Chemistries  Recent Labs  Lab 07/16/18 1452 07/16/18 1458 07/16/18 1858 07/17/18 0255 07/18/18 0341  NA 140 139  --  141 140  K 4.1 4.2  --   3.9 4.2  CL 97* 98  --  99 98  CO2 30  --   --  28 31  GLUCOSE 175* 180*  --  151* 183*  BUN 33* 38*  --  32* 31*  CREATININE 3.85* 3.80* 3.65* 3.27* 2.89*  CALCIUM 8.7*  --   --  8.4* 7.9*  AST 187*  --   --  171* 123*  ALT 61*  --   --  55* 48*  ALKPHOS 101  --   --  90 84  BILITOT 1.3*  --   --  0.9 1.0   ------------------------------------------------------------------------------------------------------------------ No results for input(s): CHOL, HDL, LDLCALC, TRIG, CHOLHDL, LDLDIRECT in the last 72 hours.  Lab Results  Component Value Date   HGBA1C 7.5 (H) 07/16/2018   ------------------------------------------------------------------------------------------------------------------ Recent Labs    07/16/18 1858  TSH 87.688*   ------------------------------------------------------------------------------------------------------------------ Recent Labs    07/18/18 0341  VITAMINB12 >7,500*    Coagulation profile Recent Labs  Lab 07/16/18 1452 07/16/18 1858  INR 1.07 0.98    No results for input(s): DDIMER in the last 72 hours.  Cardiac Enzymes No results for input(s): CKMB, TROPONINI, MYOGLOBIN in the last 168 hours.  Invalid input(s): CK ------------------------------------------------------------------------------------------------------------------    Component Value Date/Time   BNP 200.6 (H) 01/18/2015 1035    Micro Results Recent Results (from the past 240 hour(s))  MRSA PCR Screening     Status: None   Collection Time: 07/16/18  8:03 PM  Result Value Ref Range Status   MRSA by PCR NEGATIVE NEGATIVE Final    Comment:        The GeneXpert MRSA Assay (FDA approved for NASAL specimens only), is one component of a comprehensive MRSA colonization surveillance program. It is not intended to diagnose MRSA infection nor to guide or monitor treatment for MRSA infections. Performed at [facility], [place] [address] [place], [place] [address]      Radiology Reports Ct Head Wo Contrast  Result Date: 07/16/2018 CLINICAL DATA:  78 year old male with history of trauma. Altered mental status. Found down. EXAM: CT HEAD WITHOUT CONTRAST CT CERVICAL SPINE WITHOUT CONTRAST TECHNIQUE: Multidetector CT imaging of the head and cervical spine was performed following the standard protocol without intravenous contrast. Multiplanar CT image reconstructions of the cervical spine were also generated. COMPARISON:  Head CT 03/07/2017.  Cervical spine CT 03/03/2016. FINDINGS: CT HEAD FINDINGS Brain: Mild cerebral atrophy. Patchy and confluent areas of decreased attenuation are noted throughout the deep and periventricular white matter of the cerebral hemispheres bilaterally, compatible with chronic microvascular ischemic disease. No evidence of acute infarction,  hemorrhage, hydrocephalus, extra-axial collection or mass lesion/mass effect. Vascular: No hyperdense vessel or unexpected calcification. Skull: Normal. Negative for fracture or focal lesion. Sinuses/Orbits: Small mucosal retention cyst or polyp in the anterior aspect of the left maxillary sinus incidentally noted. No acute finding. Other: None. CT CERVICAL SPINE FINDINGS Alignment: Exaggerated lordosis at C2-C3, likely positional. Alignment is otherwise anatomic. Skull base and vertebrae: Status post ACDF at C3-C4 with complete bony fusion at this level. No acute fracture. No primary bone lesion or focal pathologic process. Soft tissues and spinal canal: No prevertebral fluid or swelling. No visible canal hematoma. Disc levels: Multilevel degenerative disc disease, most severe at C6-C7 and T1-T2. Moderate multilevel facet arthropathy. Upper chest: Emphysema. Other: None. IMPRESSION: 1. No evidence of significant acute traumatic injury to the skull, brain or cervical spine. 2. Mild cerebral atrophy with chronic microvascular ischemic changes in the cerebral white matter. 3. Multilevel degenerative disc disease  and cervical spondylosis, with postoperative changes of ACDF at C3-C4, as above. Electronically Signed   By: Vinnie Langton M.D.   On: 07/16/2018 16:24   Ct Cervical Spine Wo Contrast  Result Date: 07/16/2018 CLINICAL DATA:  78 year old male with history of trauma. Altered mental status. Found down. EXAM: CT HEAD WITHOUT CONTRAST CT CERVICAL SPINE WITHOUT CONTRAST TECHNIQUE: Multidetector CT imaging of the head and cervical spine was performed following the standard protocol without intravenous contrast. Multiplanar CT image reconstructions of the cervical spine were also generated. COMPARISON:  Head CT 03/07/2017.  Cervical spine CT 03/03/2016. FINDINGS: CT HEAD FINDINGS Brain: Mild cerebral atrophy. Patchy and confluent areas of decreased attenuation are noted throughout the deep and periventricular white matter of the cerebral hemispheres bilaterally, compatible with chronic microvascular ischemic disease. No evidence of acute infarction, hemorrhage, hydrocephalus, extra-axial collection or mass lesion/mass effect. Vascular: No hyperdense vessel or unexpected calcification. Skull: Normal. Negative for fracture or focal lesion. Sinuses/Orbits: Small mucosal retention cyst or polyp in the anterior aspect of the left maxillary sinus incidentally noted. No acute finding. Other: None. CT CERVICAL SPINE FINDINGS Alignment: Exaggerated lordosis at C2-C3, likely positional. Alignment is otherwise anatomic. Skull base and vertebrae: Status post ACDF at C3-C4 with complete bony fusion at this level. No acute fracture. No primary bone lesion or focal pathologic process. Soft tissues and spinal canal: No prevertebral fluid or swelling. No visible canal hematoma. Disc levels: Multilevel degenerative disc disease, most severe at C6-C7 and T1-T2. Moderate multilevel facet arthropathy. Upper chest: Emphysema. Other: None. IMPRESSION: 1. No evidence of significant acute traumatic injury to the skull, brain or cervical  spine. 2. Mild cerebral atrophy with chronic microvascular ischemic changes in the cerebral white matter. 3. Multilevel degenerative disc disease and cervical spondylosis, with postoperative changes of ACDF at C3-C4, as above. Electronically Signed   By: Vinnie Langton M.D.   On: 07/16/2018 16:24   Mr Brain Wo Contrast  Result Date: 07/17/2018 CLINICAL DATA:  Altered level of consciousness, unexplained. Encephalopathy. EXAM: MRI HEAD WITHOUT CONTRAST TECHNIQUE: Multiplanar, multiecho pulse sequences of the brain and surrounding structures were obtained without intravenous contrast. COMPARISON:  CT head without contrast 07/16/2018 FINDINGS: Brain: Study is mildly degraded by patient motion. Mild generalized atrophy and white matter disease is within normal limits for age. No acute infarct, hemorrhage, or mass lesion is present. The ventricles are of proportionate to the degree of atrophy. No significant extraaxial fluid collection is present. The internal auditory canals are within normal limits. The brainstem and cerebellum are within normal limits. Vascular: Flow is present  in the major intracranial arteries. Skull and upper cervical spine: Cervical fusion is noted at C3-4. There is slight retrolisthesis and disc disease at C2-3. The craniocervical junction is normal. Midline structures are unremarkable otherwise. Exaggerated cervical lordosis is noted. Sinuses/Orbits: The paranasal sinuses and right mastoid air cells are clear. A left mastoid effusion is present. No obstructing nasopharyngeal lesion is present. The globes and orbits are within normal limits. IMPRESSION: 1. Normal MRI appearance of the brain for age. No acute or focal abnormality to explain the patient's symptoms. 2. Left mastoid effusion. No obstructing nasopharyngeal lesion is present. 3. Degenerative changes of the cervical spine adjacent level disease at C2-3 following fusion at C3-4. Electronically Signed   By: San Morelle M.D.    On: 07/17/2018 12:29   US Renal  Result Date: 07/16/2018 CLINICAL DATA:  Acute renal failure EXAM: RENAL / URINARY TRACT ULTRASOUND COMPLETE COMPARISON:  None. FINDINGS: Right Kidney: Renal measurements: 10.3 x 4.9 x 5.3 cm = volume: 139 mL. Diffuse renal parenchymal atrophy. No hydronephrosis or mass lesion identified. Left Kidney: Renal measurements: 8.7 x 4.8 x 5 cm = volume: 109 mL. Limited visualization. Lower pole is obscured by overlying bowel gas. There appears to be diffuse renal parenchymal atrophy. No mass or hydronephrosis demonstrated as visualized. Bladder: Bladder is not visualized, completely decompressed. IMPRESSION: Bilateral diffuse renal parenchymal atrophy.  No hydronephrosis. Electronically Signed   By: Lucienne Capers M.D.   On: 07/16/2018 20:19   Dg Chest Port 1 View  Result Date: 07/17/2018 CLINICAL DATA:  Crackles in the lung bases on auscultatory examination. EXAM: PORTABLE CHEST 1 VIEW COMPARISON:  07/16/2018 and earlier. FINDINGS: Cardiac silhouette mildly to moderately enlarged for AP portable technique, unchanged. Minimal atelectasis at the RIGHT lung base, new since yesterday. Lungs otherwise clear. No confluent airspace consolidation. No visible pleural effusions, though the costophrenic sulci are excluded from the image. IMPRESSION: Minimal RIGHT basilar atelectasis. No acute cardiopulmonary disease otherwise. Stable cardiomegaly without pulmonary edema. Electronically Signed   By: Evangeline Dakin M.D.   On: 07/17/2018 21:41   Dg Chest Port 1 View  Result Date: 07/16/2018 CLINICAL DATA:  Patient found down by family. EXAM: PORTABLE CHEST 1 VIEW COMPARISON:  03/07/2017 FINDINGS: The heart size and mediastinal contours are within normal limits. Aortic atherosclerosis is noted at the arch, stable in appearance without aneurysm. Both lungs are clear. The visualized skeletal structures are unremarkable. IMPRESSION: No active disease. Electronically Signed   By: Ashley Royalty  M.D.   On: 07/16/2018 14:56   Dg Knee Complete 4 Views Left  Result Date: 07/16/2018 CLINICAL DATA:  Left knee swelling. Patient found down today. EXAM: LEFT KNEE - COMPLETE 4+ VIEW COMPARISON:  03/04/2016 FINDINGS: Tricompartmental osteoarthritis of the knee is redemonstrated with moderate femorotibial and patellofemoral joint space narrowing and spurring identified. Cortical irregularity of the anterior aspect of the medial femoral condyle is seen on the lateral view which appears more likely to be chronic as it appears likely present on the previous exam though more obscured by overlap from the patient's patella. A moderate-sized suprapatellar joint effusion is identified on current exam, increased from prior without fat fluid level. IMPRESSION: 1. 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 2. Cortical irregularity of the anterior aspect of the medial femoral condyle is seen on the lateral view which appears more likely to be chronic as it appears more obscured by overlap from the patient's patella. If there is pain out of proportion to radiographic findings however,  consider cross-sectional imaging such as CT for better assessment. - Electronically Signed   By: Ashley Royalty M.D.   On: 07/16/2018 17:09    Time Spent in minutes  30   Lala Lund M.D on 07/18/2018 at 11:23 AM  To page go to [email or web address] - password [facility]

## 2018-07-19 ENCOUNTER — Inpatient Hospital Stay (HOSPITAL_COMMUNITY): Payer: Medicare Other

## 2018-07-19 LAB — COMPREHENSIVE METABOLIC PANEL
ALT: 43 U/L (ref 0–44)
AST: 83 U/L — ABNORMAL HIGH (ref 15–41)
Albumin: 3.4 g/dL — ABNORMAL LOW (ref 3.5–5.0)
Alkaline Phosphatase: 77 U/L (ref 38–126)
Anion gap: 8 (ref 5–15)
BILIRUBIN TOTAL: 0.8 mg/dL (ref 0.3–1.2)
BUN: 34 mg/dL — ABNORMAL HIGH (ref 8–23)
CO2: 33 mmol/L — ABNORMAL HIGH (ref 22–32)
Calcium: 8.2 mg/dL — ABNORMAL LOW (ref 8.9–10.3)
Chloride: 100 mmol/L (ref 98–111)
Creatinine, Ser: 2.55 mg/dL — ABNORMAL HIGH (ref 0.61–1.24)
GFR calc Af Amer: 27 mL/min — ABNORMAL LOW (ref >60)
GFR calc non Af Amer: 24 mL/min — ABNORMAL LOW (ref >60)
GLUCOSE: 175 mg/dL — AB (ref 70–99)
Potassium: 4.4 mmol/L (ref 3.5–5.1)
Sodium: 141 mmol/L (ref 135–145)
Total Protein: 6.6 g/dL (ref 6.5–8.1)

## 2018-07-19 LAB — CBC
HCT: 38.8 % — ABNORMAL LOW (ref 39.0–52.0)
Hemoglobin: 11.9 g/dL — ABNORMAL LOW (ref 13.0–17.0)
MCH: 30.8 pg (ref 26.0–34.0)
MCHC: 30.7 g/dL (ref 30.0–36.0)
MCV: 100.5 fL — ABNORMAL HIGH (ref 80.0–100.0)
Platelets: 168 10*3/uL (ref 150–400)
RBC: 3.86 MIL/uL — AB (ref 4.22–5.81)
RDW: 13.2 % (ref 11.5–15.5)
WBC: 7.9 10*3/uL (ref 4.0–10.5)
nRBC: 0 % (ref 0.0–0.2)

## 2018-07-19 LAB — GLUCOSE, CAPILLARY
GLUCOSE-CAPILLARY: 195 mg/dL — AB (ref 70–99)
Glucose-Capillary: 169 mg/dL — ABNORMAL HIGH (ref 70–99)
Glucose-Capillary: 203 mg/dL — ABNORMAL HIGH (ref 70–99)
Glucose-Capillary: 158 mg/dL — ABNORMAL HIGH (ref 70–99)
Glucose-Capillary: 122 mg/dL — ABNORMAL HIGH (ref 70–99)

## 2018-07-19 LAB — FOLATE RBC
Folate, Hemolysate: >620 ng/mL
Folate, RBC: >1746 ng/mL (ref >498)
Hematocrit: 35.5 % — ABNORMAL LOW (ref 37.5–51.0)

## 2018-07-19 LAB — TSH: TSH: 39.018 u[IU]/mL — ABNORMAL HIGH (ref 0.350–4.500)

## 2018-07-19 LAB — MAGNESIUM: Magnesium: 3.2 mg/dL — ABNORMAL HIGH (ref 1.7–2.4)

## 2018-07-19 LAB — BRAIN NATRIURETIC PEPTIDE: B NATRIURETIC PEPTIDE 5: 762.3 pg/mL — AB (ref 0.0–100.0)

## 2018-07-19 LAB — RPR: RPR Ser Ql: NONREACTIVE

## 2018-07-19 MED ORDER — ASPIRIN EC 325 MG PO TBEC
325.0000 mg | DELAYED_RELEASE_TABLET | Freq: Every day | ORAL | Status: DC
  Administered 2018-07-20: 325 mg via ORAL
  Filled 2018-07-19: qty 1

## 2018-07-19 MED ORDER — RESOURCE THICKENUP CLEAR PO POWD
ORAL | Status: DC | PRN
  Filled 2018-07-19: qty 125

## 2018-07-19 MED ORDER — LEVOTHYROXINE SODIUM 100 MCG/5ML IV SOLN
50.0000 ug | Freq: Once | INTRAVENOUS | Status: AC
  Administered 2018-07-19: 50 ug via INTRAVENOUS
  Filled 2018-07-19: qty 5

## 2018-07-19 MED ORDER — DEXTROSE 5 % IV SOLN
INTRAVENOUS | Status: DC
  Administered 2018-07-19 – 2018-07-20 (×2): via INTRAVENOUS

## 2018-07-19 NOTE — Consult Note (Signed)
[place] Nurse wound consult note Reason for Consult:heels and sacrum Wound type: 1-2. Deep tissue injury: bilateral heels 3. Deep tissue injury: sacrum 4. Partial thickness skin injury: thoracic spine Pressure Injury POA: Yes Measurement: 1. Right heel: 2cm x 1.5cm x 0cm  2. Left heel lateral: 2.7cm x 1.0cm x 0cm  3. Sacrum: 14cm x 8cm x 0.1cm  4. Thoracic spine: 2cm x 2cm x 0.1cm  Wound bed: 1. Dark purple non blanchable intact skin 2. Dark purple non blanchable intact skin 3. Superficial skin peeling with underlying dark purple tissue, skin open 4. Pink, clean Drainage (amount, consistency, odor) moderate, no odor from the sacrum.  No drainage from the bilateral heel ulcers; scant from the back wound, non purulent, no odor Periwound: intact Dressing procedure/placement/frequency: 1. Add low air loss mattress for pressure redistribution 2. Add Prevalon boots bilaterally for off loading of the heel ulcers 3. Silicone foam to all wounds to protect, insulate, absorb exudate 4. Maximize nutrition for wound healing   Discussed POC with patient and bedside nurse.  Re consult if needed, will not follow at this time. Thanks  Nyia Tsao [employer], RN,CWOCN, CNS, [place] [phone])

## 2018-07-19 NOTE — Progress Notes (Signed)
PROGRESS NOTE                                                                                                                                                                                                             Patient Demographics:    Harold Martin, is a 78 y.o. male, DOB - 07-18-1940, IDP:[id number]  Admit date - 07/16/2018   Admitting Physician Thurnell Lose, MD  Outpatient Primary MD for the patient is Anda Kraft, MD  LOS - 3  CC AMS     Brief Narrative   Harold Martin  is a 78 y.o. male, severe COPD, hypothyroidism, insulin-dependent type 2 diabetes mellitus who lives at home alone, still walks and ambulates by himself and drives a car, who had been suffering with mild cold for the last few days, was talking to his daughter on a daily basis last conversation 2 days ago, this afternoon when the daughter tried to get in touch with the patient she did not get any response, when she went to visit him which he was found down on the floor unresponsive.  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    Subjective:   Patient in bed, appears comfortable, denies any headache, no fever, no chest pain or pressure, no shortness of breath , no abdominal pain. No focal weakness.    Assessment  & Plan :     1.  Metabolic encephalopathy likely due to early myxedema coma.  Started on IV Synthroid with excellent effect, remarkably improved, speech and PT to evaluate, may require placement, continue to monitor TSH free T4 and T3.  2.  Severe dehydration with hypernatremia and ARF.  Resolved after hydration now creatinine back to baseline of 2.5.  Renal ultrasound was nonacute.  Discontinue Foley catheter on 07/19/2018 and monitor postvoid residuals through bladder scans.  3.  Rhabdomyolysis.  Almost resolved after IV fluids.  4.  COPD.  Initial blood gas was venous, ABG was  stable.  No acute issues supportive care.  5.  Hypertension.  PRN hydralazine only for now.  6.  Left knee effusion.  X-ray is nonacute, consulted orthopedics who did not think that at this time patient requires an arthrocentesis.  7. DM type II.  Now insulin-dependent.  Continue half home dose 7030 along with sliding scale and monitor.  Lab Results  Component Value Date   HGBA1C 7.5 (H) 07/16/2018   CBG (  last 3)  Recent Labs    07/18/18 2334 07/19/18 0417 07/19/18 0802  GLUCAP 161* 169* 31*     Family Communication  :  Daughter at the time of admission  Code Status :  Full  Disposition Plan  :  TBD likely SNF  Consults  :  Ortho  Procedures  :    CT - MRI Brain - non acute  Renal US - CKD  EEG - Non acute, non specific changes   DVT Prophylaxis  :   Heparin   Lab Results  Component Value Date   PLT 168 07/19/2018    Diet :  Diet Order            Diet NPO time specified Except for: Sips with Meds  Diet effective now               Inpatient Medications Scheduled Meds: . aspirin EC  325 mg Oral Daily  . chlorhexidine  15 mL Mouth Rinse BID  . heparin  5,000 Units Subcutaneous Q8H  . insulin aspart  0-15 Units Subcutaneous Q4H  . insulin NPH Human  20 Units Subcutaneous QAC breakfast  . levothyroxine  150 mcg Intravenous Q0600  . mouth rinse  15 mL Mouth Rinse BID  . tamsulosin  0.4 mg Oral Q1200   Continuous Infusions: . dextrose 50 mL/hr at 07/19/18 0456   PRN Meds:.acetaminophen **OR** [DISCONTINUED] acetaminophen, bisacodyl, haloperidol lactate, hydrALAZINE  Antibiotics  :   Anti-infectives (From admission, onward)   Start     Dose/Rate Route Frequency Ordered Stop   07/16/18 1700  levofloxacin (LEVAQUIN) IVPB 500 mg     500 mg 100 mL/hr over 60 Minutes Intravenous  Once 07/16/18 1656 07/16/18 2205          Objective:   Vitals:   07/18/18 1800 07/18/18 1900 07/18/18 2000 07/19/18 0806  BP: 123/69   111/90  Pulse: 79 78 78 82    Resp: 18 17 17 20   Temp:    98.3 F (36.8 C)  TempSrc:    Oral  SpO2: 97% 96% 90% 98%  Weight:      Height:        Wt Readings from Last 3 Encounters:  07/17/18 101.3 kg  03/04/16 109.5 kg  02/25/15 97 kg     Intake/Output Summary (Last 24 hours) at 07/19/2018 1006 Last data filed at 07/19/2018 0643 Gross per 24 hour  Intake 353.21 ml  Output 450 ml  Net -96.79 ml     Physical Exam   Awake Alert,  No new F.N deficits, Normal affect [place].AT,PERRAL Supple Neck,No JVD, No cervical lymphadenopathy appriciated.  Symmetrical Chest wall movement, Good air movement bilaterally, CTAB RRR,No Gallops, Rubs or new Murmurs, No Parasternal Heave +ve B.Sounds, Abd Soft, No tenderness, No organomegaly appriciated, No rebound - guarding or rigidity. No Cyanosis, Clubbing or edema, mild L knee effusion       Data Review:    CBC Recent Labs  Lab 07/16/18 1452 07/16/18 1458 07/16/18 1858 07/17/18 0255 07/18/18 0341 07/19/18 0431  WBC 11.2*  --  12.3* 10.1 9.4 7.9  HGB 13.6 14.3 12.9* 12.3* 12.0* 11.9*  HCT 44.1 42.0 41.3 39.9 39.0 38.8*  PLT 185  --  182 166 159 168  MCV 100.2*  --  99.3 99.3 103.2* 100.5*  MCH 30.9  --  31.0 30.6 31.7 30.8  MCHC 30.8  --  31.2 30.8 30.8 30.7  RDW 13.2  --  13.4 13.3 13.6 13.2  LYMPHSABS 0.5*  --   --   --   --   --   MONOABS 1.0  --   --   --   --   --   EOSABS 0.1  --   --   --   --   --   BASOSABS 0.1  --   --   --   --   --     Chemistries  Recent Labs  Lab 07/16/18 1452 07/16/18 1458 07/16/18 1858 07/17/18 0255 07/18/18 0341 07/19/18 0431  NA 140 139  --  141 140 141  K 4.1 4.2  --  3.9 4.2 4.4  CL 97* 98  --  99 98 100  CO2 30  --   --  28 31 33*  GLUCOSE 175* 180*  --  151* 183* 175*  BUN 33* 38*  --  32* 31* 34*  CREATININE 3.85* 3.80* 3.65* 3.27* 2.89* 2.55*  CALCIUM 8.7*  --   --  8.4* 7.9* 8.2*  MG  --   --   --   --   --  3.2*  AST 187*  --   --  171* 123* 83*  ALT 61*  --   --  55* 48* 43  ALKPHOS 101  --    --  90 84 77  BILITOT 1.3*  --   --  0.9 1.0 0.8   ------------------------------------------------------------------------------------------------------------------ No results for input(s): CHOL, HDL, LDLCALC, TRIG, CHOLHDL, LDLDIRECT in the last 72 hours.  Lab Results  Component Value Date   HGBA1C 7.5 (H) 07/16/2018   ------------------------------------------------------------------------------------------------------------------ Recent Labs    07/19/18 0431  TSH 39.018*   ------------------------------------------------------------------------------------------------------------------ Recent Labs    07/18/18 0341  VITAMINB12 >7,500*    Coagulation profile Recent Labs  Lab 07/16/18 1452 07/16/18 1858  INR 1.07 0.98    No results for input(s): DDIMER in the last 72 hours.  Cardiac Enzymes No results for input(s): CKMB, TROPONINI, MYOGLOBIN in the last 168 hours.  Invalid input(s): CK ------------------------------------------------------------------------------------------------------------------    Component Value Date/Time   BNP 200.6 (H) 01/18/2015 1035    Micro Results Recent Results (from the past 240 hour(s))  MRSA PCR Screening     Status: None   Collection Time: 07/16/18  8:03 PM  Result Value Ref Range Status   MRSA by PCR NEGATIVE NEGATIVE Final    Comment:        The GeneXpert MRSA Assay (FDA approved for NASAL specimens only), is one component of a comprehensive MRSA colonization surveillance program. It is not intended to diagnose MRSA infection nor to guide or monitor treatment for MRSA infections. Performed at [facility], [place] [address], [place], [place] [address]     Radiology Reports Ct Head Wo Contrast  Result Date: 07/16/2018 CLINICAL DATA:  78 year old male with history of trauma. Altered mental status. Found down. EXAM: CT HEAD WITHOUT CONTRAST CT CERVICAL SPINE WITHOUT CONTRAST TECHNIQUE: Multidetector CT imaging of  the head and cervical spine was performed following the standard protocol without intravenous contrast. Multiplanar CT image reconstructions of the cervical spine were also generated. COMPARISON:  Head CT 03/07/2017.  Cervical spine CT 03/03/2016. FINDINGS: CT HEAD FINDINGS Brain: Mild cerebral atrophy. Patchy and confluent areas of decreased attenuation are noted throughout the deep and periventricular white matter of the cerebral hemispheres bilaterally, compatible with chronic microvascular ischemic disease. No evidence of acute infarction, hemorrhage, hydrocephalus, extra-axial collection or mass lesion/mass effect. Vascular: No hyperdense vessel or unexpected calcification.  Skull: Normal. Negative for fracture or focal lesion. Sinuses/Orbits: Small mucosal retention cyst or polyp in the anterior aspect of the left maxillary sinus incidentally noted. No acute finding. Other: None. CT CERVICAL SPINE FINDINGS Alignment: Exaggerated lordosis at C2-C3, likely positional. Alignment is otherwise anatomic. Skull base and vertebrae: Status post ACDF at C3-C4 with complete bony fusion at this level. No acute fracture. No primary bone lesion or focal pathologic process. Soft tissues and spinal canal: No prevertebral fluid or swelling. No visible canal hematoma. Disc levels: Multilevel degenerative disc disease, most severe at C6-C7 and T1-T2. Moderate multilevel facet arthropathy. Upper chest: Emphysema. Other: None. IMPRESSION: 1. No evidence of significant acute traumatic injury to the skull, brain or cervical spine. 2. Mild cerebral atrophy with chronic microvascular ischemic changes in the cerebral white matter. 3. Multilevel degenerative disc disease and cervical spondylosis, with postoperative changes of ACDF at C3-C4, as above. Electronically Signed   By: Vinnie Langton M.D.   On: 07/16/2018 16:24   Ct Cervical Spine Wo Contrast  Result Date: 07/16/2018 CLINICAL DATA:  78 year old male with history of trauma.  Altered mental status. Found down. EXAM: CT HEAD WITHOUT CONTRAST CT CERVICAL SPINE WITHOUT CONTRAST TECHNIQUE: Multidetector CT imaging of the head and cervical spine was performed following the standard protocol without intravenous contrast. Multiplanar CT image reconstructions of the cervical spine were also generated. COMPARISON:  Head CT 03/07/2017.  Cervical spine CT 03/03/2016. FINDINGS: CT HEAD FINDINGS Brain: Mild cerebral atrophy. Patchy and confluent areas of decreased attenuation are noted throughout the deep and periventricular white matter of the cerebral hemispheres bilaterally, compatible with chronic microvascular ischemic disease. No evidence of acute infarction, hemorrhage, hydrocephalus, extra-axial collection or mass lesion/mass effect. Vascular: No hyperdense vessel or unexpected calcification. Skull: Normal. Negative for fracture or focal lesion. Sinuses/Orbits: Small mucosal retention cyst or polyp in the anterior aspect of the left maxillary sinus incidentally noted. No acute finding. Other: None. CT CERVICAL SPINE FINDINGS Alignment: Exaggerated lordosis at C2-C3, likely positional. Alignment is otherwise anatomic. Skull base and vertebrae: Status post ACDF at C3-C4 with complete bony fusion at this level. No acute fracture. No primary bone lesion or focal pathologic process. Soft tissues and spinal canal: No prevertebral fluid or swelling. No visible canal hematoma. Disc levels: Multilevel degenerative disc disease, most severe at C6-C7 and T1-T2. Moderate multilevel facet arthropathy. Upper chest: Emphysema. Other: None. IMPRESSION: 1. No evidence of significant acute traumatic injury to the skull, brain or cervical spine. 2. Mild cerebral atrophy with chronic microvascular ischemic changes in the cerebral white matter. 3. Multilevel degenerative disc disease and cervical spondylosis, with postoperative changes of ACDF at C3-C4, as above. Electronically Signed   By: Vinnie Langton M.D.    On: 07/16/2018 16:24   Mr Brain Wo Contrast  Result Date: 07/17/2018 CLINICAL DATA:  Altered level of consciousness, unexplained. Encephalopathy. EXAM: MRI HEAD WITHOUT CONTRAST TECHNIQUE: Multiplanar, multiecho pulse sequences of the brain and surrounding structures were obtained without intravenous contrast. COMPARISON:  CT head without contrast 07/16/2018 FINDINGS: Brain: Study is mildly degraded by patient motion. Mild generalized atrophy and white matter disease is within normal limits for age. No acute infarct, hemorrhage, or mass lesion is present. The ventricles are of proportionate to the degree of atrophy. No significant extraaxial fluid collection is present. The internal auditory canals are within normal limits. The brainstem and cerebellum are within normal limits. Vascular: Flow is present in the major intracranial arteries. Skull and upper cervical spine: Cervical fusion is noted at  C3-4. There is slight retrolisthesis and disc disease at C2-3. The craniocervical junction is normal. Midline structures are unremarkable otherwise. Exaggerated cervical lordosis is noted. Sinuses/Orbits: The paranasal sinuses and right mastoid air cells are clear. A left mastoid effusion is present. No obstructing nasopharyngeal lesion is present. The globes and orbits are within normal limits. IMPRESSION: 1. Normal MRI appearance of the brain for age. No acute or focal abnormality to explain the patient's symptoms. 2. Left mastoid effusion. No obstructing nasopharyngeal lesion is present. 3. Degenerative changes of the cervical spine adjacent level disease at C2-3 following fusion at C3-4. Electronically Signed   By: San Morelle M.D.   On: 07/17/2018 12:29   US Renal  Result Date: 07/16/2018 CLINICAL DATA:  Acute renal failure EXAM: RENAL / URINARY TRACT ULTRASOUND COMPLETE COMPARISON:  None. FINDINGS: Right Kidney: Renal measurements: 10.3 x 4.9 x 5.3 cm = volume: 139 mL. Diffuse renal parenchymal  atrophy. No hydronephrosis or mass lesion identified. Left Kidney: Renal measurements: 8.7 x 4.8 x 5 cm = volume: 109 mL. Limited visualization. Lower pole is obscured by overlying bowel gas. There appears to be diffuse renal parenchymal atrophy. No mass or hydronephrosis demonstrated as visualized. Bladder: Bladder is not visualized, completely decompressed. IMPRESSION: Bilateral diffuse renal parenchymal atrophy.  No hydronephrosis. Electronically Signed   By: Lucienne Capers M.D.   On: 07/16/2018 20:19   Dg Chest Port 1 View  Result Date: 07/17/2018 CLINICAL DATA:  Crackles in the lung bases on auscultatory examination. EXAM: PORTABLE CHEST 1 VIEW COMPARISON:  07/16/2018 and earlier. FINDINGS: Cardiac silhouette mildly to moderately enlarged for AP portable technique, unchanged. Minimal atelectasis at the RIGHT lung base, new since yesterday. Lungs otherwise clear. No confluent airspace consolidation. No visible pleural effusions, though the costophrenic sulci are excluded from the image. IMPRESSION: Minimal RIGHT basilar atelectasis. No acute cardiopulmonary disease otherwise. Stable cardiomegaly without pulmonary edema. Electronically Signed   By: Evangeline Dakin M.D.   On: 07/17/2018 21:41   Dg Chest Port 1 View  Result Date: 07/16/2018 CLINICAL DATA:  Patient found down by family. EXAM: PORTABLE CHEST 1 VIEW COMPARISON:  03/07/2017 FINDINGS: The heart size and mediastinal contours are within normal limits. Aortic atherosclerosis is noted at the arch, stable in appearance without aneurysm. Both lungs are clear. The visualized skeletal structures are unremarkable. IMPRESSION: No active disease. Electronically Signed   By: Ashley Royalty M.D.   On: 07/16/2018 14:56   Dg Knee Complete 4 Views Left  Result Date: 07/16/2018 CLINICAL DATA:  Left knee swelling. Patient found down today. EXAM: LEFT KNEE - COMPLETE 4+ VIEW COMPARISON:  03/04/2016 FINDINGS: Tricompartmental osteoarthritis of the knee is  redemonstrated with moderate femorotibial and patellofemoral joint space narrowing and spurring identified. Cortical irregularity of the anterior aspect of the medial femoral condyle is seen on the lateral view which appears more likely to be chronic as it appears likely present on the previous exam though more obscured by overlap from the patient's patella. A moderate-sized suprapatellar joint effusion is identified on current exam, increased from prior without fat fluid level. IMPRESSION: 1. 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 2. Cortical irregularity of the anterior aspect of the medial femoral condyle is seen on the lateral view which appears more likely to be chronic as it appears more obscured by overlap from the patient's patella. If there is pain out of proportion to radiographic findings however, consider cross-sectional imaging such as CT for better assessment. - Electronically Signed   By:  Ashley Royalty M.D.   On: 07/16/2018 17:09    Time Spent in minutes  30   Lala Lund M.D on 07/19/2018 at 10:06 AM  To page go to [email or web address] - password [facility]

## 2018-07-19 NOTE — Progress Notes (Signed)
Modified Barium Swallow Progress Note  Patient Details  Name: NEIZAN DEBRUHL MRN: [id number] Date of Birth: 05-05-1941  Today's Date: 07/19/2018  Modified Barium Swallow completed.  Full report located under Chart Review in the Imaging Section.  Brief recommendations include the following:  Clinical Impression  Pt demonstrates a moderate oral/oropharyngeal dysphagia with high risk of aspriation. Primarly problems include impulsivity and cognitive impairment as well as slightly decreased coordination of oral transit with volume dependent premature spillage before the swallow.  Strength relatively good though 2-3 swallows needed to fully clear oropharynx of bolus.  Function was variable accross assessment; Oral containment with initial SLP controlled sips of thin liquids was adequate, oral transit volitional with only slight delayed in swallowing initiation intermittently and trace frank penetration during the swallow, eventually accumulating to trace sensed aspiration post swallow. However, when pt took independent sips with cup or straw he overloaded his mouth with an enourmous bolus and released portions with poor control and gross aspiration before the swallow with hard cough response. self fed trials of nectar thick liquids via straw were tolerated well. Overall, pt will need to initaite a modfied diet (dys 2- fine chop/nectar thick liquids) with f/u at bedside to attempt safer intake with thin - one reasonably sized sip at a time without immediate cough. Expect pt will have ongoing aspiration events and will be noncompliant with recommendations.    Swallow Evaluation Recommendations       SLP Diet Recommendations: Dysphagia 2 (Fine chop) solids;Nectar thick liquid   Liquid Administration via: Cup;Straw   Medication Administration: Whole meds with puree   Supervision: Full supervision/cueing for compensatory strategies;Staff to assist with self feeding   Compensations: Slow rate;Small  sips/bites       Oral Care Recommendations: Oral care BID   Other Recommendations: Order thickener from pharmacy;Have oral suction available   Herbie [place], MA [place] Pager [phone] Office [phone]  Lynann Beaver 07/19/2018,2:22 PM

## 2018-07-19 NOTE — Clinical Social Work Note (Signed)
Clinical Social Work Assessment  Patient Details  Name: Harold Martin MRN: [id number] Date of Birth: Jul 08, 1940  Date of referral:  07/19/18               Reason for consult:  Facility Placement                Permission sought to share information with:  Facility Sport and exercise psychologist, Family Supports Permission granted to share information::  No  Name::     Paediatric nurse::  SNFs  Relationship::  Daughter  Contact Information:  [phone]  Housing/Transportation Living arrangements for the past 2 months:  [place] of Information:  Adult Children Patient Interpreter Needed:  None Criminal Activity/Legal Involvement Pertinent to Current Situation/Hospitalization:  No - Comment as needed Significant Relationships:  Adult Children Lives with:  Self Do you feel safe going back to the place where you live?  No Need for family participation in patient care:  Yes (Comment)  Care giving concerns:  CSW received consult for possible SNF placement at time of discharge. CSW spoke with patient's daughter regarding PT recommendation of SNF placement at time of discharge. Patient's daughter reported that patient will likely not do well with home health and would refuse to work with them so she prefers SNF. CSW to continue to follow and assist with discharge planning needs.   Social Worker assessment / plan:  CSW spoke with patient's daughter concerning possibility of rehab at [facility] before returning home.  Employment status:  Retired Nurse, adult PT Recommendations:  [place] / Referral to community resources:  [place]  Patient/Family's Response to care:  Patient's daughter recognizes need for rehab before returning home and is agreeable to a SNF in [place]. Patient's daughter reported preference for [facility] and [facility] secondary since patient has been to both before. CSW explained  insurance authorization process.  Patient/Family's Understanding of and Emotional Response to Diagnosis, Current Treatment, and Prognosis:  Patient/family is realistic regarding therapy needs and expressed being hopeful for SNF placement. Patient's daughter expressed understanding of CSW role and discharge process as well as medical condition. No questions/concerns about plan or treatment.    Emotional Assessment Appearance:  Appears stated age Attitude/Demeanor/Rapport:  Unable to Assess Affect (typically observed):  Unable to Assess Orientation:  Oriented to Self Alcohol / Substance use:  Not Applicable Psych involvement (Current and /or in the community):  No (Comment)  Discharge Needs  Concerns to be addressed:  Care Coordination Readmission within the last 30 days:  No Current discharge risk:  None Barriers to Discharge:  Continued Medical Work up   [employer], LCSW 07/19/2018, 12:31 PM

## 2018-07-19 NOTE — NC FL2 (Signed)
[place] LEVEL OF CARE SCREENING TOOL     IDENTIFICATION  Patient Name: Harold Martin Birthdate: 19-Apr-1941 Sex: male Admission Date (Current Location): 07/16/2018  [facility] and [place] Number:  [employer] and Address:  [facility] [place], [place] [address] [place], [place] [address]      Provider Number: [id number]  Attending Physician Name and Address:  Thurnell Lose, MD  Relative Name and Phone Number:  [name]. daughter, [phone]    Current Level of Care: Hospital Recommended Level of Care: [place] Prior Approval Number:    Date Approved/Denied:   PASRR Number: [id number]  Discharge Plan: SNF    Current Diagnoses: Patient Active Problem List   Diagnosis Date Noted  . Metabolic encephalopathy 79/89/2119  . Altered mental state 03/04/2016  . Acute renal failure ([place])   . COPD (chronic obstructive pulmonary disease) ([place]) 02/26/2015  . Acute bronchitis 02/26/2015  . Leukocytosis 02/25/2015  . Acute encephalopathy 02/24/2015  . Swallowing difficulty 01/20/2015  . COPD exacerbation ([place]) 01/18/2015  . Acute renal failure superimposed on stage 3 chronic kidney disease ([place]) 01/16/2015  . Type 2 diabetes mellitus with hyperglycemia ([place]) 01/16/2015  . Essential hypertension 01/16/2015  . Normocytic anemia   . Hyponatremia 10/06/2013  . GI bleed 10/05/2013  . EMPHYSEMA 10/01/2009  . DYSPNEA ON EXERTION 09/07/2009  . CARCINOMA, BLADDER 09/06/2009  . DIABETES, TYPE 2 09/06/2009  . HYPERLIPIDEMIA 09/06/2009  . HYPERTENSION 09/06/2009    Orientation RESPIRATION BLADDER Height & Weight     Self  O2(Nasal cannula 4L) Continent, Indwelling catheter Weight: 101.3 kg Height:  6\' 1"  (185.4 cm)  BEHAVIORAL SYMPTOMS/MOOD NEUROLOGICAL BOWEL NUTRITION STATUS      Continent Diet  AMBULATORY STATUS COMMUNICATION OF NEEDS Skin   Extensive Assist Verbally PU Stage and Appropriate Care(Stage I on heel; Stage  II on vertebral column;Stage III on sacrum)                       Personal Care Assistance Level of Assistance  Bathing, Feeding, Dressing Bathing Assistance: Maximum assistance Feeding assistance: Maximum assistance Dressing Assistance: Maximum assistance     Functional Limitations Info  Sight, Hearing, Speech Sight Info: Adequate Hearing Info: Adequate Speech Info: Adequate    SPECIAL CARE FACTORS FREQUENCY  PT (By licensed PT), OT (By licensed OT)     PT Frequency: 5x/week OT Frequency: 3x/week            Contractures Contractures Info: Not present    Additional Factors Info  Code Status, Allergies, Insulin Sliding Scale Code Status Info: Partial Allergies Info: Prednisone   Insulin Sliding Scale Info: Every 4 hours       Current Medications (07/19/2018):  This is the current hospital active medication list Current Facility-Administered Medications  Medication Dose Route Frequency Provider Last Rate Last Dose  . acetaminophen (TYLENOL) tablet 650 mg  650 mg Oral Q6H PRN Thurnell Lose, MD       Or  . acetaminophen (TYLENOL) suppository 650 mg  650 mg Rectal Q6H PRN Thurnell Lose, MD      . aspirin suppository 300 mg  300 mg Rectal Daily Thurnell Lose, MD   300 mg at 07/19/18 0803  . bisacodyl (DULCOLAX) EC tablet 5 mg  5 mg Oral Daily PRN Thurnell Lose, MD      . chlorhexidine (PERIDEX) 0.12 % solution 15 mL  15 mL Mouth Rinse BID Thurnell Lose,  MD   15 mL at 07/19/18 0803  . dextrose 5 % solution   Intravenous Continuous Thurnell Lose, MD 50 mL/hr at 07/19/18 0456    . haloperidol lactate (HALDOL) injection 2 mg  2 mg Intravenous Q6H PRN Thurnell Lose, MD      . heparin injection 5,000 Units  5,000 Units Subcutaneous Q8H Thurnell Lose, MD   5,000 Units at 07/19/18 0505  . hydrALAZINE (APRESOLINE) injection 10 mg  10 mg Intravenous Q6H PRN Thurnell Lose, MD      . insulin aspart (novoLOG) injection 0-15 Units  0-15 Units  Subcutaneous Q4H Thurnell Lose, MD   3 Units at 07/19/18 0802  . insulin NPH Human (HUMULIN N,NOVOLIN N) injection 20 Units  20 Units Subcutaneous QAC breakfast Thurnell Lose, MD      . levothyroxine (SYNTHROID, LEVOTHROID) injection 150 mcg  150 mcg Intravenous Q0600 Thurnell Lose, MD   150 mcg at 07/19/18 0457  . MEDLINE mouth rinse  15 mL Mouth Rinse BID Thurnell Lose, MD   15 mL at 07/19/18 0803  . tamsulosin (FLOMAX) capsule 0.4 mg  0.4 mg Oral Q1200 Thurnell Lose, MD         Discharge Medications: Please see discharge summary for a list of discharge medications.  Relevant Imaging Results:  Relevant Lab Results:   Additional Information SS#: [id number]  Benard Halsted, LCSW

## 2018-07-20 LAB — CBC
HCT: 38.7 % — ABNORMAL LOW (ref 39.0–52.0)
Hemoglobin: 12.1 g/dL — ABNORMAL LOW (ref 13.0–17.0)
MCH: 31.8 pg (ref 26.0–34.0)
MCHC: 31.3 g/dL (ref 30.0–36.0)
MCV: 101.8 fL — ABNORMAL HIGH (ref 80.0–100.0)
Platelets: 181 10*3/uL (ref 150–400)
RBC: 3.8 MIL/uL — ABNORMAL LOW (ref 4.22–5.81)
RDW: 13.2 % (ref 11.5–15.5)
WBC: 7.5 10*3/uL (ref 4.0–10.5)
nRBC: 0 % (ref 0.0–0.2)

## 2018-07-20 LAB — COMPREHENSIVE METABOLIC PANEL
ALT: 42 U/L (ref 0–44)
ANION GAP: 7 (ref 5–15)
AST: 71 U/L — ABNORMAL HIGH (ref 15–41)
Albumin: 3.6 g/dL (ref 3.5–5.0)
Alkaline Phosphatase: 80 U/L (ref 38–126)
BUN: 30 mg/dL — ABNORMAL HIGH (ref 8–23)
CO2: 36 mmol/L — ABNORMAL HIGH (ref 22–32)
Calcium: 8.4 mg/dL — ABNORMAL LOW (ref 8.9–10.3)
Chloride: 98 mmol/L (ref 98–111)
Creatinine, Ser: 2.1 mg/dL — ABNORMAL HIGH (ref 0.61–1.24)
GFR calc Af Amer: 35 mL/min — ABNORMAL LOW (ref >60)
GFR calc non Af Amer: 30 mL/min — ABNORMAL LOW (ref >60)
Glucose, Bld: 169 mg/dL — ABNORMAL HIGH (ref 70–99)
Potassium: 4 mmol/L (ref 3.5–5.1)
Sodium: 141 mmol/L (ref 135–145)
Total Bilirubin: 0.8 mg/dL (ref 0.3–1.2)
Total Protein: 6.7 g/dL (ref 6.5–8.1)

## 2018-07-20 LAB — GLUCOSE, CAPILLARY
GLUCOSE-CAPILLARY: 165 mg/dL — AB (ref 70–99)
GLUCOSE-CAPILLARY: 186 mg/dL — AB (ref 70–99)
Glucose-Capillary: 127 mg/dL — ABNORMAL HIGH (ref 70–99)
Glucose-Capillary: 161 mg/dL — ABNORMAL HIGH (ref 70–99)
Glucose-Capillary: 163 mg/dL — ABNORMAL HIGH (ref 70–99)
Glucose-Capillary: 227 mg/dL — ABNORMAL HIGH (ref 70–99)

## 2018-07-20 LAB — MAGNESIUM: Magnesium: 3.2 mg/dL — ABNORMAL HIGH (ref 1.7–2.4)

## 2018-07-20 MED ORDER — HALOPERIDOL LACTATE 5 MG/ML IJ SOLN
2.0000 mg | Freq: Once | INTRAMUSCULAR | Status: AC
  Administered 2018-07-20: 2 mg via INTRAMUSCULAR
  Filled 2018-07-20: qty 1

## 2018-07-20 MED ORDER — DEXTROSE 5 % IV SOLN
INTRAVENOUS | Status: AC
  Administered 2018-07-20 – 2018-07-21 (×2): via INTRAVENOUS

## 2018-07-20 MED ORDER — ASPIRIN 81 MG PO CHEW
81.0000 mg | CHEWABLE_TABLET | Freq: Every day | ORAL | Status: DC
  Administered 2018-07-21 – 2018-07-29 (×9): 81 mg via ORAL
  Filled 2018-07-20 (×9): qty 1

## 2018-07-20 NOTE — Progress Notes (Signed)
PROGRESS NOTE                                                                                                                                                                                                             Patient Demographics:    Harold Martin, is a 78 y.o. male, DOB - 1940/09/04, VQM:[id number]  Admit date - 07/16/2018   Admitting Physician Thurnell Lose, MD  Outpatient Primary MD for the patient is Anda Kraft, MD  LOS - 4  CC AMS     Brief Narrative   Harold Martin  is a 78 y.o. male, severe COPD, hypothyroidism, insulin-dependent type 2 diabetes mellitus who lives at home alone, still walks and ambulates by himself and drives a car, who had been suffering with mild cold for the last few days, was talking to his daughter on a daily basis last conversation 2 days ago, this afternoon when the daughter tried to get in touch with the patient she did not get any response, when she went to visit him which he was found down on the floor unresponsive.  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    Subjective:   In bed, today mildly confused, denies any headache chest or abdominal pain.  No shortness of breath.   Assessment  & Plan :     1.  Metabolic encephalopathy likely due to early myxedema coma &  now mild hospital-acquired delirium.  Started on IV Synthroid with excellent effect, mentation has remarkably improved although today he has developed mild delirium, continue IV Synthroid, will require outpatient endocrine follow-up with Dr. Renato Shin.  Baseline cortisol random was stable hence I have stopped IV steroids, his myxedema coma has considerably improved, now has mild hospital-acquired delirium for which PRN Haldol will be continued.  Strictly avoid benzodiazepines and narcotics.  2.  Severe dehydration with hypernatremia and ARF.  Resolved after  hydration now creatinine back to baseline of 2.5.  Renal ultrasound was nonacute.  Discontinue Foley catheter on 07/19/2018 and monitor postvoid residuals through bladder scans.  3.  Rhabdomyolysis.  Almost resolved after IV fluids.  4.  COPD.  Initial blood gas was venous, ABG was stable.  No acute issues supportive care.  5.  Hypertension.  PRN hydralazine only for now.  6.  Left knee effusion.  X-ray is nonacute, consulted orthopedics who did not think that at this time patient requires an  arthrocentesis.  7. DM type II.  Now insulin-dependent.  Continue half home dose 70/30 along with sliding scale and monitor.  Lab Results  Component Value Date   HGBA1C 7.5 (H) 07/16/2018   CBG (last 3)  Recent Labs    07/20/18 0003 07/20/18 0507 07/20/18 0739  GLUCAP 127* 165* 25*     Family Communication  :  Daughter at the time of admission  Code Status :  Full  Disposition Plan  :-  SNF in 1 to 2 days  Consults  :  Ortho  Procedures  :    CT - MRI Brain - non acute  Renal US - CKD  EEG - Non acute, non specific changes   DVT Prophylaxis  :   Heparin   Lab Results  Component Value Date   PLT 181 07/20/2018    Diet :  Diet Order            DIET DYS 2 Room service appropriate? Yes; Fluid consistency: Nectar Thick  Diet effective now               Inpatient Medications Scheduled Meds: . aspirin EC  325 mg Oral Daily  . chlorhexidine  15 mL Mouth Rinse BID  . heparin  5,000 Units Subcutaneous Q8H  . insulin aspart  0-15 Units Subcutaneous Q4H  . insulin NPH Human  20 Units Subcutaneous QAC breakfast  . levothyroxine  150 mcg Intravenous Q0600  . mouth rinse  15 mL Mouth Rinse BID  . tamsulosin  0.4 mg Oral Q1200   Continuous Infusions: . dextrose     PRN Meds:.acetaminophen **OR** [DISCONTINUED] acetaminophen, bisacodyl, haloperidol lactate, hydrALAZINE, RESOURCE THICKENUP CLEAR  Antibiotics  :   Anti-infectives (From admission, onward)   Start      Dose/Rate Route Frequency Ordered Stop   07/16/18 1700  levofloxacin (LEVAQUIN) IVPB 500 mg     500 mg 100 mL/hr over 60 Minutes Intravenous  Once 07/16/18 1656 07/16/18 2205          Objective:   Vitals:   07/19/18 0806 07/19/18 1029 07/19/18 1200 07/19/18 1642  BP: 111/90 119/72 123/77 129/69  Pulse: 82 81 80 82  Resp: 20 18 14 15   Temp: 98.3 F (36.8 C)  98 F (36.7 C) 98.2 F (36.8 C)  TempSrc: Oral  Oral Oral  SpO2: 98% 98% 100% 92%  Weight:      Height:        Wt Readings from Last 3 Encounters:  07/17/18 101.3 kg  03/04/16 109.5 kg  02/25/15 97 kg     Intake/Output Summary (Last 24 hours) at 07/20/2018 0927 Last data filed at 07/19/2018 1855 Gross per 24 hour  Intake 434.91 ml  Output 250 ml  Net 184.91 ml     Physical Exam   Awake , mildly confused, No new F.N deficits,   [place].AT,PERRAL Supple Neck,No JVD, No cervical lymphadenopathy appriciated.  Symmetrical Chest wall movement, Good air movement bilaterally, CTAB RRR,No Gallops, Rubs or new Murmurs, No Parasternal Heave +ve B.Sounds, Abd Soft, No tenderness, No organomegaly appriciated, No rebound - guarding or rigidity. No Cyanosis, Clubbing or edema, No new Rash or bruise, L knee mild effusion    Data Review:    CBC Recent Labs  Lab 07/16/18 1452  07/16/18 1858 07/17/18 0255 07/18/18 0341 07/19/18 0431 07/20/18 0321  WBC 11.2*  --  12.3* 10.1 9.4 7.9 7.5  HGB 13.6   < > 12.9* 12.3* 12.0* 11.9* 12.1*  HCT  44.1   < > 41.3 39.9 39.0  35.5* 38.8* 38.7*  PLT 185  --  182 166 159 168 181  MCV 100.2*  --  99.3 99.3 103.2* 100.5* 101.8*  MCH 30.9  --  31.0 30.6 31.7 30.8 31.8  MCHC 30.8  --  31.2 30.8 30.8 30.7 31.3  RDW 13.2  --  13.4 13.3 13.6 13.2 13.2  LYMPHSABS 0.5*  --   --   --   --   --   --   MONOABS 1.0  --   --   --   --   --   --   EOSABS 0.1  --   --   --   --   --   --   BASOSABS 0.1  --   --   --   --   --   --    < > = values in this interval not displayed.     Chemistries  Recent Labs  Lab 07/16/18 1452 07/16/18 1458 07/16/18 1858 07/17/18 0255 07/18/18 0341 07/19/18 0431 07/20/18 0321  NA 140 139  --  141 140 141 141  K 4.1 4.2  --  3.9 4.2 4.4 4.0  CL 97* 98  --  99 98 100 98  CO2 30  --   --  28 31 33* 36*  GLUCOSE 175* 180*  --  151* 183* 175* 169*  BUN 33* 38*  --  32* 31* 34* 30*  CREATININE 3.85* 3.80* 3.65* 3.27* 2.89* 2.55* 2.10*  CALCIUM 8.7*  --   --  8.4* 7.9* 8.2* 8.4*  MG  --   --   --   --   --  3.2* 3.2*  AST 187*  --   --  171* 123* 83* 71*  ALT 61*  --   --  55* 48* 43 42  ALKPHOS 101  --   --  90 84 77 80  BILITOT 1.3*  --   --  0.9 1.0 0.8 0.8   ------------------------------------------------------------------------------------------------------------------ No results for input(s): CHOL, HDL, LDLCALC, TRIG, CHOLHDL, LDLDIRECT in the last 72 hours.  Lab Results  Component Value Date   HGBA1C 7.5 (H) 07/16/2018   ------------------------------------------------------------------------------------------------------------------ Recent Labs    07/19/18 0431  TSH 39.018*   ------------------------------------------------------------------------------------------------------------------ Recent Labs    07/18/18 0341  VITAMINB12 >7,500*    Coagulation profile Recent Labs  Lab 07/16/18 1452 07/16/18 1858  INR 1.07 0.98    No results for input(s): DDIMER in the last 72 hours.  Cardiac Enzymes No results for input(s): CKMB, TROPONINI, MYOGLOBIN in the last 168 hours.  Invalid input(s): CK ------------------------------------------------------------------------------------------------------------------    Component Value Date/Time   BNP 762.3 (H) 07/19/2018 0431    Micro Results Recent Results (from the past 240 hour(s))  MRSA PCR Screening     Status: None   Collection Time: 07/16/18  8:03 PM  Result Value Ref Range Status   MRSA by PCR NEGATIVE NEGATIVE Final    Comment:        The  GeneXpert MRSA Assay (FDA approved for NASAL specimens only), is one component of a comprehensive MRSA colonization surveillance program. It is not intended to diagnose MRSA infection nor to guide or monitor treatment for MRSA infections. Performed at [facility], [place] [address], [place], [place] [address]     Radiology Reports Dg Chest 2 View  Result Date: 07/19/2018 CLINICAL DATA:  Cough, question aspiration pneumonia or pneumonia EXAM: CHEST - 2  VIEW COMPARISON:  07/17/2018 FINDINGS: Upper normal heart size. Mediastinal contours and pulmonary vascularity normal. Atherosclerotic calcification aorta. Small amount of barium at and presumed barium tablet are identified within thoracic esophagus. Lungs clear. No pleural effusion or pneumothorax. No acute osseous findings. IMPRESSION: Retained barium and presumed barium tablet within thoracic esophagus. No acute pulmonary abnormalities. Electronically Signed   By: Lavonia Dana M.D.   On: 07/19/2018 13:47   Ct Head Wo Contrast  Result Date: 07/16/2018 CLINICAL DATA:  78 year old male with history of trauma. Altered mental status. Found down. EXAM: CT HEAD WITHOUT CONTRAST CT CERVICAL SPINE WITHOUT CONTRAST TECHNIQUE: Multidetector CT imaging of the head and cervical spine was performed following the standard protocol without intravenous contrast. Multiplanar CT image reconstructions of the cervical spine were also generated. COMPARISON:  Head CT 03/07/2017.  Cervical spine CT 03/03/2016. FINDINGS: CT HEAD FINDINGS Brain: Mild cerebral atrophy. Patchy and confluent areas of decreased attenuation are noted throughout the deep and periventricular white matter of the cerebral hemispheres bilaterally, compatible with chronic microvascular ischemic disease. No evidence of acute infarction, hemorrhage, hydrocephalus, extra-axial collection or mass lesion/mass effect. Vascular: No hyperdense vessel or unexpected calcification. Skull: Normal. Negative  for fracture or focal lesion. Sinuses/Orbits: Small mucosal retention cyst or polyp in the anterior aspect of the left maxillary sinus incidentally noted. No acute finding. Other: None. CT CERVICAL SPINE FINDINGS Alignment: Exaggerated lordosis at C2-C3, likely positional. Alignment is otherwise anatomic. Skull base and vertebrae: Status post ACDF at C3-C4 with complete bony fusion at this level. No acute fracture. No primary bone lesion or focal pathologic process. Soft tissues and spinal canal: No prevertebral fluid or swelling. No visible canal hematoma. Disc levels: Multilevel degenerative disc disease, most severe at C6-C7 and T1-T2. Moderate multilevel facet arthropathy. Upper chest: Emphysema. Other: None. IMPRESSION: 1. No evidence of significant acute traumatic injury to the skull, brain or cervical spine. 2. Mild cerebral atrophy with chronic microvascular ischemic changes in the cerebral white matter. 3. Multilevel degenerative disc disease and cervical spondylosis, with postoperative changes of ACDF at C3-C4, as above. Electronically Signed   By: Vinnie Langton M.D.   On: 07/16/2018 16:24   Ct Cervical Spine Wo Contrast  Result Date: 07/16/2018 CLINICAL DATA:  78 year old male with history of trauma. Altered mental status. Found down. EXAM: CT HEAD WITHOUT CONTRAST CT CERVICAL SPINE WITHOUT CONTRAST TECHNIQUE: Multidetector CT imaging of the head and cervical spine was performed following the standard protocol without intravenous contrast. Multiplanar CT image reconstructions of the cervical spine were also generated. COMPARISON:  Head CT 03/07/2017.  Cervical spine CT 03/03/2016. FINDINGS: CT HEAD FINDINGS Brain: Mild cerebral atrophy. Patchy and confluent areas of decreased attenuation are noted throughout the deep and periventricular white matter of the cerebral hemispheres bilaterally, compatible with chronic microvascular ischemic disease. No evidence of acute infarction, hemorrhage,  hydrocephalus, extra-axial collection or mass lesion/mass effect. Vascular: No hyperdense vessel or unexpected calcification. Skull: Normal. Negative for fracture or focal lesion. Sinuses/Orbits: Small mucosal retention cyst or polyp in the anterior aspect of the left maxillary sinus incidentally noted. No acute finding. Other: None. CT CERVICAL SPINE FINDINGS Alignment: Exaggerated lordosis at C2-C3, likely positional. Alignment is otherwise anatomic. Skull base and vertebrae: Status post ACDF at C3-C4 with complete bony fusion at this level. No acute fracture. No primary bone lesion or focal pathologic process. Soft tissues and spinal canal: No prevertebral fluid or swelling. No visible canal hematoma. Disc levels: Multilevel degenerative disc disease, most severe at C6-C7 and T1-T2.  Moderate multilevel facet arthropathy. Upper chest: Emphysema. Other: None. IMPRESSION: 1. No evidence of significant acute traumatic injury to the skull, brain or cervical spine. 2. Mild cerebral atrophy with chronic microvascular ischemic changes in the cerebral white matter. 3. Multilevel degenerative disc disease and cervical spondylosis, with postoperative changes of ACDF at C3-C4, as above. Electronically Signed   By: Vinnie Langton M.D.   On: 07/16/2018 16:24   Mr Brain Wo Contrast  Result Date: 07/17/2018 CLINICAL DATA:  Altered level of consciousness, unexplained. Encephalopathy. EXAM: MRI HEAD WITHOUT CONTRAST TECHNIQUE: Multiplanar, multiecho pulse sequences of the brain and surrounding structures were obtained without intravenous contrast. COMPARISON:  CT head without contrast 07/16/2018 FINDINGS: Brain: Study is mildly degraded by patient motion. Mild generalized atrophy and white matter disease is within normal limits for age. No acute infarct, hemorrhage, or mass lesion is present. The ventricles are of proportionate to the degree of atrophy. No significant extraaxial fluid collection is present. The internal  auditory canals are within normal limits. The brainstem and cerebellum are within normal limits. Vascular: Flow is present in the major intracranial arteries. Skull and upper cervical spine: Cervical fusion is noted at C3-4. There is slight retrolisthesis and disc disease at C2-3. The craniocervical junction is normal. Midline structures are unremarkable otherwise. Exaggerated cervical lordosis is noted. Sinuses/Orbits: The paranasal sinuses and right mastoid air cells are clear. A left mastoid effusion is present. No obstructing nasopharyngeal lesion is present. The globes and orbits are within normal limits. IMPRESSION: 1. Normal MRI appearance of the brain for age. No acute or focal abnormality to explain the patient's symptoms. 2. Left mastoid effusion. No obstructing nasopharyngeal lesion is present. 3. Degenerative changes of the cervical spine adjacent level disease at C2-3 following fusion at C3-4. Electronically Signed   By: San Morelle M.D.   On: 07/17/2018 12:29   US Renal  Result Date: 07/16/2018 CLINICAL DATA:  Acute renal failure EXAM: RENAL / URINARY TRACT ULTRASOUND COMPLETE COMPARISON:  None. FINDINGS: Right Kidney: Renal measurements: 10.3 x 4.9 x 5.3 cm = volume: 139 mL. Diffuse renal parenchymal atrophy. No hydronephrosis or mass lesion identified. Left Kidney: Renal measurements: 8.7 x 4.8 x 5 cm = volume: 109 mL. Limited visualization. Lower pole is obscured by overlying bowel gas. There appears to be diffuse renal parenchymal atrophy. No mass or hydronephrosis demonstrated as visualized. Bladder: Bladder is not visualized, completely decompressed. IMPRESSION: Bilateral diffuse renal parenchymal atrophy.  No hydronephrosis. Electronically Signed   By: Lucienne Capers M.D.   On: 07/16/2018 20:19   Dg Chest Port 1 View  Result Date: 07/17/2018 CLINICAL DATA:  Crackles in the lung bases on auscultatory examination. EXAM: PORTABLE CHEST 1 VIEW COMPARISON:  07/16/2018 and earlier.  FINDINGS: Cardiac silhouette mildly to moderately enlarged for AP portable technique, unchanged. Minimal atelectasis at the RIGHT lung base, new since yesterday. Lungs otherwise clear. No confluent airspace consolidation. No visible pleural effusions, though the costophrenic sulci are excluded from the image. IMPRESSION: Minimal RIGHT basilar atelectasis. No acute cardiopulmonary disease otherwise. Stable cardiomegaly without pulmonary edema. Electronically Signed   By: Evangeline Dakin M.D.   On: 07/17/2018 21:41   Dg Chest Port 1 View  Result Date: 07/16/2018 CLINICAL DATA:  Patient found down by family. EXAM: PORTABLE CHEST 1 VIEW COMPARISON:  03/07/2017 FINDINGS: The heart size and mediastinal contours are within normal limits. Aortic atherosclerosis is noted at the arch, stable in appearance without aneurysm. Both lungs are clear. The visualized skeletal structures are unremarkable. IMPRESSION:  No active disease. Electronically Signed   By: Ashley Royalty M.D.   On: 07/16/2018 14:56   Dg Knee Complete 4 Views Left  Result Date: 07/16/2018 CLINICAL DATA:  Left knee swelling. Patient found down today. EXAM: LEFT KNEE - COMPLETE 4+ VIEW COMPARISON:  03/04/2016 FINDINGS: Tricompartmental osteoarthritis of the knee is redemonstrated with moderate femorotibial and patellofemoral joint space narrowing and spurring identified. Cortical irregularity of the anterior aspect of the medial femoral condyle is seen on the lateral view which appears more likely to be chronic as it appears likely present on the previous exam though more obscured by overlap from the patient's patella. A moderate-sized suprapatellar joint effusion is identified on current exam, increased from prior without fat fluid level. IMPRESSION: 1. 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 2. Cortical irregularity of the anterior aspect of the medial femoral condyle is seen on the lateral view which appears more  likely to be chronic as it appears more obscured by overlap from the patient's patella. If there is pain out of proportion to radiographic findings however, consider cross-sectional imaging such as CT for better assessment. - Electronically Signed   By: Ashley Royalty M.D.   On: 07/16/2018 17:09    Time Spent in minutes  30   Lala Lund M.D on 07/20/2018 at 9:27 AM  To page go to [email or web address] - password [facility]

## 2018-07-20 NOTE — Progress Notes (Signed)
Harold Martin continuously pulling at wires and IV. Despite mitts, Harold Martin managed to pull out third IV in the past day. Dr. Candiss Norse paged and verbal orders received for bilateral wrist restraints. Harold Martin's daughter Harold Martin called and notified of situation. Daughter verbalized understanding and stated she will be up to visit the Harold Martin. Will continue to monitor Harold Martin.

## 2018-07-20 NOTE — Progress Notes (Signed)
Physical Therapy Treatment Patient Details Name: Harold Martin MRN: [id number] DOB: 1941-07-05 Today's Date: 07/20/2018    History of Present Illness Pt is a 78 y/o male admitted secondary to being found down and unresponsive. Pt found to have acute encephalopathy and severe dehydration. PMH including but not limited to severe COPD, hypothyroidism, insulin-dependent type 2 diabetes mellitus.    PT Comments    Pt performed supine to sit and sit to supine, rolling, seated balance training and upper/lower extremity strengthening during session this am.  Pt performed with need for max verbal cueing as attention span in lacking.  Pt continues to benefit from skilled placement at SNF to improve strength and function.  Plan next session for standing in sara stedy frame to improve standing tolerance.    Follow Up Recommendations  SNF     Equipment Recommendations  None recommended by PT    Recommendations for Other Services       Precautions / Restrictions Precautions Precautions: Fall Restrictions Weight Bearing Restrictions: No    Mobility  Bed Mobility Overal bed mobility: Needs Assistance Bed Mobility: Supine to Sit;Sit to Supine;Rolling Rolling: Mod assist;+2 for physical assistance   Supine to sit: Mod assist;+2 for physical assistance Sit to supine: Max assist;+2 for physical assistance   General bed mobility comments: Pt able to initiate movement but required external assistance to complete bed mobility.  Once sitting edge of bed.  Pt throwing weight forward with poor awareness of body position.  Pt has difficulty maintaining focus on task.  Pt required mod assistance to maintain sitting balance.  Pt required mutlimodal cueing.    Transfers Overall transfer level: Needs assistance Equipment used: None(Pt did hold to a back of a chair for UE support.  ) Transfers: Sit to/from Stand(only able to achieve partial sit to stand.  ) Sit to Stand: Max assist;+2 physical  assistance         General transfer comment: Pt able to come 1/2 way into standing but unable to achieve full standing.  attempted x2 trials.  On second attempt PTA blocked L knee but both side remain to buckle.  Returned to supine to focus on LE strengthening.    Ambulation/Gait Ambulation/Gait assistance: (NT)               Stairs             Wheelchair Mobility    Modified Rankin (Stroke Patients Only)       Balance Overall balance assessment: Needs assistance Sitting-balance support: Feet supported;Bilateral upper extremity supported;Single extremity supported Sitting balance-Leahy Scale: Poor Sitting balance - Comments: fluctuating between needing max A to mod A and one UE support Postural control: (anterior lean.  )   Standing balance-Leahy Scale: Zero                              Cognition Arousal/Alertness: Awake/alert Behavior During Therapy: Restless;Impulsive Overall Cognitive Status: Impaired/Different from baseline Area of Impairment: Orientation;Memory;Following commands;Safety/judgement;Awareness;Attention;Problem solving                 Orientation Level: Time;Situation;Place(reports he is at  in [place].  ) Current Attention Level: Sustained Memory: Decreased short-term memory Following Commands: Follows one step commands inconsistently;Follows one step commands with increased time Safety/Judgement: Decreased awareness of deficits;Decreased awareness of safety Awareness: Intellectual Problem Solving: Slow processing;Decreased initiation;Difficulty sequencing;Requires verbal cues;Requires tactile cues General Comments: Pt is HOH.  Exercises General Exercises - Upper Extremity Shoulder Flexion: AAROM;Both;10 reps;Supine Shoulder Extension: AAROM;Both;10 reps;Supine Elbow Flexion: AAROM;Both;10 reps;Supine Elbow Extension: AAROM;Both;10 reps;Supine General Exercises - Lower Extremity Ankle  Circles/Pumps: AROM;Both;10 reps;AAROM;Supine Heel Slides: AAROM;PROM;Both;10 reps;Supine Hip ABduction/ADduction: AAROM;PROM;10 reps;Both;Supine    General Comments        Pertinent Vitals/Pain Pain Assessment: No/denies pain Faces Pain Scale: No hurt    Home Living                      Prior Function            PT Goals (current goals can now be found in the care plan section) Acute Rehab PT Goals Patient Stated Goal: "just get it over with" Potential to Achieve Goals: Fair Progress towards PT goals: Progressing toward goals    Frequency    Min 2X/week      PT Plan Current plan remains appropriate    Co-evaluation              AM-PAC PT "6 Clicks" Mobility   Outcome Measure  Help needed turning from your back to your side while in a flat bed without using bedrails?: A Lot Help needed moving from lying on your back to sitting on the side of a flat bed without using bedrails?: A Lot Help needed moving to and from a bed to a chair (including a wheelchair)?: Total Help needed standing up from a chair using your arms (e.g., wheelchair or bedside chair)?: Total Help needed to walk in hospital room?: Total Help needed climbing 3-5 steps with a railing? : Total 6 Click Score: 8    End of Session Equipment Utilized During Treatment: Gait belt Activity Tolerance: Patient limited by fatigue;Patient limited by lethargy Patient left: in bed;with call bell/phone within reach;with bed alarm set Nurse Communication: Mobility status PT Visit Diagnosis: Other abnormalities of gait and mobility (R26.89);Muscle weakness (generalized) (M62.81)     Time: 1115-5208 PT Time Calculation (min) (ACUTE ONLY): 32 min  Charges:  $Therapeutic Exercise: 8-22 mins $Therapeutic Activity: 8-22 mins                     Governor Rooks, PTA Acute Rehabilitation Services Pager [phone] Office [phone]     Tammara Massing Eli Hose 07/20/2018, 11:56 AM

## 2018-07-20 NOTE — Care Management Important Message (Signed)
Important Message  Patient Details  Name: Harold Martin MRN: [id number] Date of Birth: 01/01/1941   Medicare Important Message Given:  Yes    Orbie Pyo 07/20/2018, 4:24 PM

## 2018-07-21 ENCOUNTER — Inpatient Hospital Stay (HOSPITAL_COMMUNITY): Payer: Medicare Other

## 2018-07-21 DIAGNOSIS — I10 Essential (primary) hypertension: Secondary | ICD-10-CM

## 2018-07-21 DIAGNOSIS — N183 Chronic kidney disease, stage 3 (moderate): Secondary | ICD-10-CM

## 2018-07-21 DIAGNOSIS — L8990 Pressure ulcer of unspecified site, unspecified stage: Secondary | ICD-10-CM

## 2018-07-21 LAB — BASIC METABOLIC PANEL
Anion gap: 8 (ref 5–15)
BUN: 26 mg/dL — ABNORMAL HIGH (ref 8–23)
CALCIUM: 8.4 mg/dL — AB (ref 8.9–10.3)
CO2: 34 mmol/L — ABNORMAL HIGH (ref 22–32)
CREATININE: 1.8 mg/dL — AB (ref 0.61–1.24)
Chloride: 98 mmol/L (ref 98–111)
GFR calc Af Amer: 42 mL/min — ABNORMAL LOW (ref >60)
GFR calc non Af Amer: 36 mL/min — ABNORMAL LOW (ref >60)
Glucose, Bld: 191 mg/dL — ABNORMAL HIGH (ref 70–99)
Potassium: 4.1 mmol/L (ref 3.5–5.1)
Sodium: 140 mmol/L (ref 135–145)

## 2018-07-21 LAB — GLUCOSE, CAPILLARY
Glucose-Capillary: 166 mg/dL — ABNORMAL HIGH (ref 70–99)
Glucose-Capillary: 171 mg/dL — ABNORMAL HIGH (ref 70–99)
Glucose-Capillary: 177 mg/dL — ABNORMAL HIGH (ref 70–99)
Glucose-Capillary: 181 mg/dL — ABNORMAL HIGH (ref 70–99)
Glucose-Capillary: 190 mg/dL — ABNORMAL HIGH (ref 70–99)
Glucose-Capillary: 206 mg/dL — ABNORMAL HIGH (ref 70–99)

## 2018-07-21 LAB — MAGNESIUM: Magnesium: 3.2 mg/dL — ABNORMAL HIGH (ref 1.7–2.4)

## 2018-07-21 NOTE — Progress Notes (Signed)
SLP Cancellation Note  Patient Details Name: Harold Martin MRN: [id number] DOB: 01/11/1941   Cancelled treatment:        Attempted to see pt for swallowing therapy and diet tolerance check.  Pt unavailble at time of SLP attempt d/t EEG.  Will reattempt as SLP schedule permits.   Celedonio Savage, MA, [place] Office: [phone]; Pager [phone]): [phone] 07/21/2018, 11:53 AM

## 2018-07-21 NOTE — Progress Notes (Signed)
EEG Completed; Results Pending  

## 2018-07-21 NOTE — Progress Notes (Signed)
Patient ID: Harold Martin, male   DOB: 1940-12-26, 78 y.o.   MRN: [id number]  PROGRESS NOTE    Harold Martin  WYO:[id number] DOB: 05/14/1941 DOA: 07/16/2018 PCP: Anda Kraft, MD   Brief Narrative:  78 year old male with history of severe COPD, hypothyroidism, insulin-dependent type 2 diabetes mellitus who lives at home with alone, still walks and ambulates [place] presented on 07/16/2018 with altered mental status when he was found down on the floor unresponsive.  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  Assessment & Plan:   Principal Problem:   Acute encephalopathy Active Problems:   EMPHYSEMA   Acute renal failure superimposed on stage 3 chronic kidney disease (HCC)   Type 2 diabetes mellitus with hyperglycemia (HCC)   Essential hypertension   COPD (chronic obstructive pulmonary disease) (HCC)   Metabolic encephalopathy   Acute metabolic encephalopathy most likely secondary to myxedema coma along with delirium -Currently on intravenous Synthroid with improvement in mental status subsequently. -Currently has episodes of delirium as well.  Monitor mental status.  Use PRN Haldol for extreme agitation and avoid benzodiazepines and narcotics -MRI of the brain was negative for acute abnormality -We will get EEG  Acute renal failure/hyponatremia/severe dehydration -Resolved.  Creatinine improving,, today is 1.8.  Renal ultrasound was negative for hydronephrosis.  Foley catheter was discontinued on 07/19/2018.  Rhabdomyolysis -Treated with IV fluids and improved  COPD -Stable.  Use nebs as needed  Hypertension -Blood pressure on the lower side.  Monitor  Left knee effusion -No need for arthrocentesis at this time as per orthopedic recommendations  Diabetes mellitus type 2 -Continue current insulin regimen along with CBGs and sliding scale insulin.  Blood sugar stable.  Elevated LFTs -Questionable cause.  Improving.   DVT  prophylaxis: Subcutaneous heparin Code Status: Full Family Communication: None at bedside Disposition Plan: SNF in 1 to 2 days if mental status improves  Consultants: None  Procedures: None  Antimicrobials: None   Subjective: Patient seen and examined at bedside.  No overnight episodes of agitation reported by nursing staff.  Patient is sleepy, does not wake up much or answer questions.  No overnight fever or vomiting reported. Objective: Vitals:   07/20/18 1755 07/20/18 2013 07/21/18 0100 07/21/18 0422  BP: 123/72 97/70  (!) 123/94  Pulse: (!) 58 85  87  Resp: 20 16  16   Temp:  (!) 97.1 F (36.2 C) 98.1 F (36.7 C) 98.1 F (36.7 C)  TempSrc:   Axillary Axillary  SpO2: 97% 99%  97%  Weight:      Height:        Intake/Output Summary (Last 24 hours) at 07/21/2018 0924 Last data filed at 07/21/2018 0900 Gross per 24 hour  Intake 917.47 ml  Output 450 ml  Net 467.47 ml   Filed Weights   07/16/18 1920 07/17/18 0347  Weight: 100.1 kg 101.3 kg    Examination:  General exam: Elderly male lying in bed.  Sleepy, hardly wakes up on calling his name.  Does not answer questions. Respiratory system: Bilateral decreased breath sounds at bases, no wheezing Cardiovascular system: S1 & S2 heard, Rate controlled Gastrointestinal system: Abdomen is nondistended, soft and nontender. Normal bowel sounds heard. Extremities: No cyanosis, edema     Data Reviewed: I have personally reviewed following labs and imaging studies  CBC: Recent Labs  Lab 07/16/18 1452  07/16/18 1858 07/17/18 0255 07/18/18 0341 07/19/18 0431 07/20/18 0321  WBC 11.2*  --  12.3* 10.1 9.4  7.9 7.5  NEUTROABS 9.5*  --   --   --   --   --   --   HGB 13.6   < > 12.9* 12.3* 12.0* 11.9* 12.1*  HCT 44.1   < > 41.3 39.9 39.0  35.5* 38.8* 38.7*  MCV 100.2*  --  99.3 99.3 103.2* 100.5* 101.8*  PLT 185  --  182 166 159 168 181   < > = values in this interval not displayed.   Basic Metabolic Panel: Recent Labs    Lab 07/17/18 0255 07/18/18 0341 07/19/18 0431 07/20/18 0321 07/21/18 0332  NA 141 140 141 141 140  K 3.9 4.2 4.4 4.0 4.1  CL 99 98 100 98 98  CO2 28 31 33* 36* 34*  GLUCOSE 151* 183* 175* 169* 191*  BUN 32* 31* 34* 30* 26*  CREATININE 3.27* 2.89* 2.55* 2.10* 1.80*  CALCIUM 8.4* 7.9* 8.2* 8.4* 8.4*  MG  --   --  3.2* 3.2* 3.2*   GFR: Estimated Creatinine Clearance: 44.4 mL/min (A) (by C-G formula based on SCr of 1.8 mg/dL (H)). Liver Function Tests: Recent Labs  Lab 07/16/18 1452 07/17/18 0255 07/18/18 0341 07/19/18 0431 07/20/18 0321  AST 187* 171* 123* 83* 71*  ALT 61* 55* 48* 43 42  ALKPHOS 101 90 84 77 80  BILITOT 1.3* 0.9 1.0 0.8 0.8  PROT 7.4 6.9 6.7 6.6 6.7  ALBUMIN 4.2 3.6 3.4* 3.4* 3.6   No results for input(s): LIPASE, AMYLASE in the last 168 hours. Recent Labs  Lab 07/16/18 1858  AMMONIA 23   Coagulation Profile: Recent Labs  Lab 07/16/18 1452 07/16/18 1858  INR 1.07 0.98   Cardiac Enzymes: Recent Labs  Lab 07/16/18 1452  CKTOTAL 7,876*   BNP (last 3 results) No results for input(s): PROBNP in the last 8760 hours. HbA1C: No results for input(s): HGBA1C in the last 72 hours. CBG: Recent Labs  Lab 07/20/18 1620 07/20/18 2008 07/21/18 0010 07/21/18 0419 07/21/18 0733  GLUCAP 161* 163* 177* 166* 171*   Lipid Profile: No results for input(s): CHOL, HDL, LDLCALC, TRIG, CHOLHDL, LDLDIRECT in the last 72 hours. Thyroid Function Tests: Recent Labs    07/19/18 0431  TSH 39.018*   Anemia Panel: No results for input(s): VITAMINB12, FOLATE, FERRITIN, TIBC, IRON, RETICCTPCT in the last 72 hours. Sepsis Labs: Recent Labs  Lab 07/16/18 1458  LATICACIDVEN 1.90    Recent Results (from the past 240 hour(s))  MRSA PCR Screening     Status: None   Collection Time: 07/16/18  8:03 PM  Result Value Ref Range Status   MRSA by PCR NEGATIVE NEGATIVE Final    Comment:        The GeneXpert MRSA Assay (FDA approved for NASAL specimens only), is  one component of a comprehensive MRSA colonization surveillance program. It is not intended to diagnose MRSA infection nor to guide or monitor treatment for MRSA infections. Performed at [facility], [place] [address], [place], [place] [address]          Radiology Studies: Dg Chest 2 View  Result Date: 07/19/2018 CLINICAL DATA:  Cough, question aspiration pneumonia or pneumonia EXAM: CHEST - 2 VIEW COMPARISON:  07/17/2018 FINDINGS: Upper normal heart size. Mediastinal contours and pulmonary vascularity normal. Atherosclerotic calcification aorta. Small amount of barium at and presumed barium tablet are identified within thoracic esophagus. Lungs clear. No pleural effusion or pneumothorax. No acute osseous findings. IMPRESSION: Retained barium and presumed barium tablet within thoracic esophagus. No  acute pulmonary abnormalities. Electronically Signed   By: Lavonia Dana M.D.   On: 07/19/2018 13:47        Scheduled Meds: . aspirin  81 mg Oral Daily  . chlorhexidine  15 mL Mouth Rinse BID  . heparin  5,000 Units Subcutaneous Q8H  . insulin aspart  0-15 Units Subcutaneous Q4H  . insulin NPH Human  20 Units Subcutaneous QAC breakfast  . levothyroxine  150 mcg Intravenous Q0600  . mouth rinse  15 mL Mouth Rinse BID  . tamsulosin  0.4 mg Oral Q1200   Continuous Infusions: . dextrose 50 mL/hr at 07/21/18 0200     LOS: 5 days        Aline August, MD [facility] Pager [phone]  If 7PM-7AM, please contact night-coverage [email or web address] Password [facility] 07/21/2018, 9:24 AM

## 2018-07-21 NOTE — Procedures (Signed)
History: 78 year old male being evaluated for encephalopathy  Sedation: None  Technique: This is a 21 channel routine scalp EEG performed at the bedside with bipolar and monopolar montages arranged in accordance to the international 10/20 system of electrode placement. One channel was dedicated to EKG recording.    Background: There is a poorly sustained posterior dominant rhythm that does achieve a frequency of 7 Hz.  The background is dominated, however, by irregular diffuse delta and theta activities.  Photic stimulation: Physiologic driving is not performed  EEG Abnormalities: 1) Generalized irregular slow activity 2) Slow PDR  Clinical Interpretation: This EEG is consistent with a generalized non-specific cerebral dysfunction(encephalopathy).   There was no seizure or seizure predisposition recorded on this study. Please note that lack of epileptiform activity on EEG does not preclude the possibility of epilepsy.   Roland Rack, MD [facility] [phone]  If 7pm- 7am, please page neurology on call as listed in [place].

## 2018-07-22 DIAGNOSIS — E1165 Type 2 diabetes mellitus with hyperglycemia: Secondary | ICD-10-CM

## 2018-07-22 LAB — COMPREHENSIVE METABOLIC PANEL
ALT: 32 U/L (ref 0–44)
AST: 44 U/L — ABNORMAL HIGH (ref 15–41)
Albumin: 3.3 g/dL — ABNORMAL LOW (ref 3.5–5.0)
Alkaline Phosphatase: 72 U/L (ref 38–126)
Anion gap: 7 (ref 5–15)
BILIRUBIN TOTAL: 0.9 mg/dL (ref 0.3–1.2)
BUN: 21 mg/dL (ref 8–23)
CO2: 36 mmol/L — ABNORMAL HIGH (ref 22–32)
Calcium: 8.8 mg/dL — ABNORMAL LOW (ref 8.9–10.3)
Chloride: 99 mmol/L (ref 98–111)
Creatinine, Ser: 1.83 mg/dL — ABNORMAL HIGH (ref 0.61–1.24)
GFR calc Af Amer: 41 mL/min — ABNORMAL LOW (ref >60)
GFR calc non Af Amer: 35 mL/min — ABNORMAL LOW (ref >60)
Glucose, Bld: 179 mg/dL — ABNORMAL HIGH (ref 70–99)
Potassium: 4 mmol/L (ref 3.5–5.1)
Sodium: 142 mmol/L (ref 135–145)
Total Protein: 6.1 g/dL — ABNORMAL LOW (ref 6.5–8.1)

## 2018-07-22 LAB — CBC WITH DIFFERENTIAL/PLATELET
Abs Immature Granulocytes: 0.12 10*3/uL — ABNORMAL HIGH (ref 0.00–0.07)
Basophils Absolute: 0 10*3/uL (ref 0.0–0.1)
Basophils Relative: 1 %
EOS ABS: 0.2 10*3/uL (ref 0.0–0.5)
EOS PCT: 4 %
HCT: 35.7 % — ABNORMAL LOW (ref 39.0–52.0)
Hemoglobin: 11.5 g/dL — ABNORMAL LOW (ref 13.0–17.0)
Immature Granulocytes: 2 %
Lymphocytes Relative: 8 %
Lymphs Abs: 0.6 10*3/uL — ABNORMAL LOW (ref 0.7–4.0)
MCH: 32.2 pg (ref 26.0–34.0)
MCHC: 32.2 g/dL (ref 30.0–36.0)
MCV: 100 fL (ref 80.0–100.0)
Monocytes Absolute: 0.6 10*3/uL (ref 0.1–1.0)
Monocytes Relative: 9 %
Neutro Abs: 5.1 10*3/uL (ref 1.7–7.7)
Neutrophils Relative %: 76 %
PLATELETS: 143 10*3/uL — AB (ref 150–400)
RBC: 3.57 MIL/uL — ABNORMAL LOW (ref 4.22–5.81)
RDW: 13.3 % (ref 11.5–15.5)
WBC: 6.6 10*3/uL (ref 4.0–10.5)
nRBC: 0 % (ref 0.0–0.2)

## 2018-07-22 LAB — GLUCOSE, CAPILLARY
Glucose-Capillary: 155 mg/dL — ABNORMAL HIGH (ref 70–99)
Glucose-Capillary: 175 mg/dL — ABNORMAL HIGH (ref 70–99)
Glucose-Capillary: 195 mg/dL — ABNORMAL HIGH (ref 70–99)
Glucose-Capillary: 198 mg/dL — ABNORMAL HIGH (ref 70–99)
Glucose-Capillary: 198 mg/dL — ABNORMAL HIGH (ref 70–99)
Glucose-Capillary: 205 mg/dL — ABNORMAL HIGH (ref 70–99)

## 2018-07-22 LAB — MAGNESIUM: Magnesium: 2.9 mg/dL — ABNORMAL HIGH (ref 1.7–2.4)

## 2018-07-22 LAB — VITAMIN B1: Vitamin B1 (Thiamine): 121.2 nmol/L (ref 66.5–200.0)

## 2018-07-22 MED ORDER — POLYETHYLENE GLYCOL 3350 17 G PO PACK
17.0000 g | PACK | Freq: Every day | ORAL | Status: DC
  Administered 2018-07-22 – 2018-07-29 (×8): 17 g via ORAL
  Filled 2018-07-22 (×8): qty 1

## 2018-07-22 NOTE — Progress Notes (Signed)
CSW submitted clinicals to insurance for SNF in anticipation of upcoming discharge.   Percell Locus Ygnacio Fecteau LCSW [phone]

## 2018-07-22 NOTE — Progress Notes (Signed)
Patient ID: Harold Martin, male   DOB: 1941/05/13, 78 y.o.   MRN: [id number]  PROGRESS NOTE    Harold Martin  [id number] DOB: 25-Aug-1940 DOA: 07/16/2018 PCP: Anda Kraft, MD   Brief Narrative:  79 year old male with history of severe COPD, hypothyroidism, insulin-dependent type 2 diabetes mellitus who lives at home with alone, still walks and ambulates [place] presented on 07/16/2018 with altered mental status when he was found down on the floor unresponsive.  TSH was extremely elevated suggesting most likely of myxedema coma. He was started on iv synthroid.  Assessment & Plan:   Principal Problem:   Acute encephalopathy Active Problems:   EMPHYSEMA   Acute renal failure superimposed on stage 3 chronic kidney disease (HCC)   Type 2 diabetes mellitus with hyperglycemia (HCC)   Essential hypertension   COPD (chronic obstructive pulmonary disease) (HCC)   Metabolic encephalopathy   Pressure injury of skin   Acute metabolic encephalopathy most likely secondary to myxedema coma along with delirium -Currently on intravenous Synthroid with improvement in mental status subsequently. -Currently has episodes of delirium as well.  Monitor mental status.  Use PRN Haldol for extreme agitation and avoid benzodiazepines and narcotics -MRI of the brain was negative for acute abnormality -EEG negative for seizure activities.  Acute renal failure/hyponatremia/severe dehydration -Resolved.  Creatinine improving.  Renal ultrasound was negative for hydronephrosis.  Foley catheter was discontinued on 07/19/2018. - Repeat AM labs  Rhabdomyolysis -Treated with IV fluids and improved  COPD -Stable.  Use nebs as needed  Hypertension -Blood pressure on the lower side.  Monitor  Left knee effusion -No need for arthrocentesis at this time as per orthopedic recommendations  Diabetes mellitus type 2 -Continue current insulin regimen along with CBGs and sliding scale insulin.   Blood sugars stable.  Elevated LFTs -Questionable cause.  Improving.   DVT prophylaxis: Subcutaneous heparin Code Status: Full Family Communication: None at bedside Disposition Plan: SNF in 1 to 2 days if mental status improves  Consultants: None  Procedures: None  Antimicrobials: None   Subjective: Patient seen and examined at bedside. He is more awake this morning. Slow to respond to questions but answers some. No overnight fever, vomiting or agitation reported by nursing staff. Objective: Vitals:   07/21/18 1421 07/21/18 1525 07/21/18 2038 07/22/18 0425  BP: (!) 108/51  126/70 117/76  Pulse: 96  88 89  Resp: 20  18 16   Temp: 98.2 F (36.8 C)  97.9 F (36.6 C) 98.5 F (36.9 C)  TempSrc: Axillary  Oral Oral  SpO2: (!) 83% 95% 99% 100%  Weight:      Height:        Intake/Output Summary (Last 24 hours) at 07/22/2018 0921 Last data filed at 07/22/2018 0615 Gross per 24 hour  Intake 190 ml  Output 600 ml  Net -410 ml   Filed Weights   07/16/18 1920 07/17/18 0347  Weight: 100.1 kg 101.3 kg    Examination:  General exam: Elderly male lying in bed.  Sleepy, wakes up more and tries to answer some questions. Poor historian.  Respiratory system: Bilateral decreased breath sounds at bases Cardiovascular system: Rate controlled, S1 S2 heard Gastrointestinal system: Abdomen is nondistended, soft and nontender. Normal bowel sounds heard. Extremities: No cyanosis, edema     Data Reviewed: I have personally reviewed following labs and imaging studies  CBC: Recent Labs  Lab 07/16/18 1452  07/17/18 0255 07/18/18 0341 07/19/18 0431 07/20/18 0321 07/22/18 0503  WBC 11.2*   < >  10.1 9.4 7.9 7.5 6.6  NEUTROABS 9.5*  --   --   --   --   --  5.1  HGB 13.6   < > 12.3* 12.0* 11.9* 12.1* 11.5*  HCT 44.1   < > 39.9 39.0  35.5* 38.8* 38.7* 35.7*  MCV 100.2*   < > 99.3 103.2* 100.5* 101.8* 100.0  PLT 185   < > 166 159 168 181 143*   < > = values in this interval not  displayed.   Basic Metabolic Panel: Recent Labs  Lab 07/17/18 0255 07/18/18 0341 07/19/18 0431 07/20/18 0321 07/21/18 0332 07/22/18 0503  NA 141 140 141 141 140  --   K 3.9 4.2 4.4 4.0 4.1  --   CL 99 98 100 98 98  --   CO2 28 31 33* 36* 34*  --   GLUCOSE 151* 183* 175* 169* 191*  --   BUN 32* 31* 34* 30* 26*  --   CREATININE 3.27* 2.89* 2.55* 2.10* 1.80*  --   CALCIUM 8.4* 7.9* 8.2* 8.4* 8.4*  --   MG  --   --  3.2* 3.2* 3.2* 2.9*   GFR: Estimated Creatinine Clearance: 44.4 mL/min (A) (by C-G formula based on SCr of 1.8 mg/dL (H)). Liver Function Tests: Recent Labs  Lab 07/16/18 1452 07/17/18 0255 07/18/18 0341 07/19/18 0431 07/20/18 0321  AST 187* 171* 123* 83* 71*  ALT 61* 55* 48* 43 42  ALKPHOS 101 90 84 77 80  BILITOT 1.3* 0.9 1.0 0.8 0.8  PROT 7.4 6.9 6.7 6.6 6.7  ALBUMIN 4.2 3.6 3.4* 3.4* 3.6   No results for input(s): LIPASE, AMYLASE in the last 168 hours. Recent Labs  Lab 07/16/18 1858  AMMONIA 23   Coagulation Profile: Recent Labs  Lab 07/16/18 1452 07/16/18 1858  INR 1.07 0.98   Cardiac Enzymes: Recent Labs  Lab 07/16/18 1452  CKTOTAL 7,876*   BNP (last 3 results) No results for input(s): PROBNP in the last 8760 hours. HbA1C: No results for input(s): HGBA1C in the last 72 hours. CBG: Recent Labs  Lab 07/21/18 1638 07/21/18 2034 07/22/18 0031 07/22/18 0430 07/22/18 0817  GLUCAP 190* 181* 175* 155* 205*   Lipid Profile: No results for input(s): CHOL, HDL, LDLCALC, TRIG, CHOLHDL, LDLDIRECT in the last 72 hours. Thyroid Function Tests: No results for input(s): TSH, T4TOTAL, FREET4, T3FREE, THYROIDAB in the last 72 hours. Anemia Panel: No results for input(s): VITAMINB12, FOLATE, FERRITIN, TIBC, IRON, RETICCTPCT in the last 72 hours. Sepsis Labs: Recent Labs  Lab 07/16/18 1458  LATICACIDVEN 1.90    Recent Results (from the past 240 hour(s))  MRSA PCR Screening     Status: None   Collection Time: 07/16/18  8:03 PM  Result  Value Ref Range Status   MRSA by PCR NEGATIVE NEGATIVE Final    Comment:        The GeneXpert MRSA Assay (FDA approved for NASAL specimens only), is one component of a comprehensive MRSA colonization surveillance program. It is not intended to diagnose MRSA infection nor to guide or monitor treatment for MRSA infections. Performed at [facility], [place] [address], [place], [place] [address]          Radiology Studies: No results found.      Scheduled Meds: . aspirin  81 mg Oral Daily  . chlorhexidine  15 mL Mouth Rinse BID  . heparin  5,000 Units Subcutaneous Q8H  . insulin aspart  0-15 Units Subcutaneous Q4H  .  insulin NPH Human  20 Units Subcutaneous QAC breakfast  . levothyroxine  150 mcg Intravenous Q0600  . mouth rinse  15 mL Mouth Rinse BID  . tamsulosin  0.4 mg Oral Q1200   Continuous Infusions:    LOS: 6 days        Aline August, MD [facility] Pager [phone]  If 7PM-7AM, please contact night-coverage [email or web address] Password [email or web address] 07/22/2018, 9:21 AM

## 2018-07-22 NOTE — Progress Notes (Signed)
Physical Therapy Treatment Patient Details Name: Harold Martin MRN: [id number] DOB: February 23, 1941 Today's Date: 07/22/2018    History of Present Illness Pt is a 78 y/o male admitted secondary to being found down and unresponsive. Pt found to have acute encephalopathy and severe dehydration. PMH including but not limited to severe COPD, hypothyroidism, insulin-dependent type 2 diabetes mellitus.    PT Comments    Pt performed functional mobility and pre-gt activities.  Pt is lacking coordination and strength in trunk to maintain balance in sitting and standing.  Utilized bed pad to assist in standing and blocking of B knees.  Pt is making functional gains but continues to benefir from SNF placement to further improve strength and function.    Follow Up Recommendations  SNF     Equipment Recommendations  None recommended by PT    Recommendations for Other Services       Precautions / Restrictions Precautions Precautions: Fall Restrictions Weight Bearing Restrictions: No    Mobility  Bed Mobility Overal bed mobility: Needs Assistance Bed Mobility: Supine to Sit     Supine to sit: Max assist;+2 for physical assistance     General bed mobility comments: Pt required assistance to advance LEs to edge of bed and to elevate trunk into sitting.  Transfers Overall transfer level: Needs assistance Equipment used: 2 person hand held assist Transfers: Sit to/from [facility] Sit to Stand: Max assist;+2 physical assistance         General transfer comment: Pt able to achieve standing with B knees blocked he remains flexed with upper trunk.  Performed stand pivot from bed to recliner chair.  Pt lacks trunk control and coordination with movement.    Ambulation/Gait Ambulation/Gait assistance: (NT)               Stairs             Wheelchair Mobility    Modified Rankin (Stroke Patients Only)       Balance Overall balance assessment: Needs  assistance Sitting-balance support: Feet supported;Bilateral upper extremity supported;Single extremity supported Sitting balance-Leahy Scale: Poor Sitting balance - Comments: fluctuating between needing max A to mod A and one UE support, demonstrated LOB anterior and posterior     Standing balance-Leahy Scale: Zero Standing balance comment: external assistance to maintain standing.                              Cognition Arousal/Alertness: Lethargic;Suspect due to medications Behavior During Therapy: Restless;Impulsive Overall Cognitive Status: Impaired/Different from baseline Area of Impairment: Orientation;Memory;Following commands;Safety/judgement;Awareness;Attention;Problem solving                 Orientation Level: Disoriented to;Time;Situation Current Attention Level: Sustained Memory: Decreased short-term memory Following Commands: Follows one step commands inconsistently;Follows one step commands with increased time Safety/Judgement: Decreased awareness of deficits;Decreased awareness of safety Awareness: Intellectual Problem Solving: Slow processing;Decreased initiation;Difficulty sequencing;Requires verbal cues;Requires tactile cues General Comments: Pt is HOH.        Exercises      General Comments        Pertinent Vitals/Pain Pain Assessment: No/denies pain    Home Living                      Prior Function            PT Goals (current goals can now be found in the care plan section) Acute Rehab PT Goals Patient Stated  Goal: "get in the chair." Potential to Achieve Goals: Fair Progress towards PT goals: Progressing toward goals    Frequency    Min 2X/week      PT Plan Current plan remains appropriate    Co-evaluation              AM-PAC PT "6 Clicks" Mobility   Outcome Measure  Help needed turning from your back to your side while in a flat bed without using bedrails?: A Lot Help needed moving from lying on  your back to sitting on the side of a flat bed without using bedrails?: A Lot Help needed moving to and from a bed to a chair (including a wheelchair)?: Total Help needed standing up from a chair using your arms (e.g., wheelchair or bedside chair)?: Total Help needed to walk in hospital room?: Total Help needed climbing 3-5 steps with a railing? : Total 6 Click Score: 8    End of Session Equipment Utilized During Treatment: Gait belt Activity Tolerance: Patient limited by fatigue;Patient limited by lethargy Patient left: in bed;with call bell/phone within reach;with bed alarm set Nurse Communication: Mobility status PT Visit Diagnosis: Other abnormalities of gait and mobility (R26.89);Muscle weakness (generalized) (M62.81)     Time: 5750-5183 PT Time Calculation (min) (ACUTE ONLY): 21 min  Charges:  $Therapeutic Activity: 8-22 mins                     Governor Rooks, PTA Acute Rehabilitation Services Pager [phone] Office [phone]     Regan Llorente Eli Hose 07/22/2018, 1:32 PM

## 2018-07-22 NOTE — Progress Notes (Addendum)
Initial Nutrition Assessment  DOCUMENTATION CODES:   Not applicable  INTERVENTION:  Vital Cuisine BID, each supplement provides 520kcal and 22g of protein.   Meal assistance to maintain caloric needs  NUTRITION DIAGNOSIS:   Inadequate oral intake related to lethargy/confusion as evidenced by meal completion < 50%.  GOAL:   Patient will meet greater than or equal to 90% of their needs  MONITOR:   PO intake, Supplement acceptance, Labs, Weight trends  REASON FOR ASSESSMENT:   Low Braden    ASSESSMENT:   78 y.o male severe COPD, hypothyroidism and T2DM. Admitted with severe dehydration,  AMS and metabolic encephalopathy likely due to early myxedema coma.   Pt was confused and non verbal when DI entered the room. No family at bedside to provide diet and weight history. DI unable to conduct nutrition focused exam at this time, pt was writhing and constantly moving.   Per the chart, pt has been consuming 20-50% of meals. Limited weight encounters in the chart. 8/29 109.5 kg, 1/11 101.3 kg.   Medications reviewed and include: Sliding Scale Insulin   Labs reviewed: BUN 26 (H)  Creatinine 1.8 (H)  Mag 2.9 (H)   Nutrition Focused Physical Exam   Unable to complete  Diet Order:   Diet Order            DIET DYS 2 Room service appropriate? Yes; Fluid consistency: Nectar Thick  Diet effective now              EDUCATION NEEDS:   Not appropriate for education at this time  Skin:  Skin Assessment: Skin Integrity Issues: Skin Integrity Issues:: Stage III, Stage I, Stage II Stage I: bilateral heels Stage II: vertebral column Stage III: sacrum  Last BM:  Unknown  Height:   Ht Readings from Last 1 Encounters:  07/16/18 6\' 1"  (1.854 m)    Weight:   Wt Readings from Last 1 Encounters:  07/17/18 101.3 kg    Ideal Body Weight:  83.6 kg  BMI:  Body mass index is 29.46 kg/m.  Estimated Nutritional Needs:   Kcal:  2000-2200 kcal/d  Protein:  100-110  g/d  Fluid:  2.0 L    Mauricia Area, MS, Dietetic Intern Pager: [phone] After hours Pager: [phone]

## 2018-07-23 DIAGNOSIS — E035 Myxedema coma: Principal | ICD-10-CM

## 2018-07-23 LAB — CBC WITH DIFFERENTIAL/PLATELET
Abs Immature Granulocytes: 0.08 10*3/uL — ABNORMAL HIGH (ref 0.00–0.07)
Basophils Absolute: 0 10*3/uL (ref 0.0–0.1)
Basophils Relative: 1 %
EOS PCT: 3 %
Eosinophils Absolute: 0.2 10*3/uL (ref 0.0–0.5)
HCT: 34.3 % — ABNORMAL LOW (ref 39.0–52.0)
Hemoglobin: 10.7 g/dL — ABNORMAL LOW (ref 13.0–17.0)
Immature Granulocytes: 1 %
Lymphocytes Relative: 8 %
Lymphs Abs: 0.5 10*3/uL — ABNORMAL LOW (ref 0.7–4.0)
MCH: 31.8 pg (ref 26.0–34.0)
MCHC: 31.2 g/dL (ref 30.0–36.0)
MCV: 102.1 fL — ABNORMAL HIGH (ref 80.0–100.0)
Monocytes Absolute: 0.7 10*3/uL (ref 0.1–1.0)
Monocytes Relative: 10 %
Neutro Abs: 5.1 10*3/uL (ref 1.7–7.7)
Neutrophils Relative %: 77 %
Platelets: 151 10*3/uL (ref 150–400)
RBC: 3.36 MIL/uL — ABNORMAL LOW (ref 4.22–5.81)
RDW: 13.4 % (ref 11.5–15.5)
WBC: 6.6 10*3/uL (ref 4.0–10.5)
nRBC: 0 % (ref 0.0–0.2)

## 2018-07-23 LAB — COMPREHENSIVE METABOLIC PANEL
ALT: 32 U/L (ref 0–44)
ANION GAP: 6 (ref 5–15)
AST: 44 U/L — ABNORMAL HIGH (ref 15–41)
Albumin: 3.3 g/dL — ABNORMAL LOW (ref 3.5–5.0)
Alkaline Phosphatase: 68 U/L (ref 38–126)
BUN: 23 mg/dL (ref 8–23)
CO2: 36 mmol/L — ABNORMAL HIGH (ref 22–32)
Calcium: 8.8 mg/dL — ABNORMAL LOW (ref 8.9–10.3)
Chloride: 100 mmol/L (ref 98–111)
Creatinine, Ser: 1.82 mg/dL — ABNORMAL HIGH (ref 0.61–1.24)
GFR calc Af Amer: 41 mL/min — ABNORMAL LOW (ref >60)
GFR calc non Af Amer: 36 mL/min — ABNORMAL LOW (ref >60)
GLUCOSE: 143 mg/dL — AB (ref 70–99)
Potassium: 4.3 mmol/L (ref 3.5–5.1)
Sodium: 142 mmol/L (ref 135–145)
Total Bilirubin: 0.8 mg/dL (ref 0.3–1.2)
Total Protein: 6 g/dL — ABNORMAL LOW (ref 6.5–8.1)

## 2018-07-23 LAB — GLUCOSE, CAPILLARY
GLUCOSE-CAPILLARY: 138 mg/dL — AB (ref 70–99)
Glucose-Capillary: 153 mg/dL — ABNORMAL HIGH (ref 70–99)
Glucose-Capillary: 153 mg/dL — ABNORMAL HIGH (ref 70–99)
Glucose-Capillary: 165 mg/dL — ABNORMAL HIGH (ref 70–99)
Glucose-Capillary: 187 mg/dL — ABNORMAL HIGH (ref 70–99)
Glucose-Capillary: 216 mg/dL — ABNORMAL HIGH (ref 70–99)

## 2018-07-23 LAB — MAGNESIUM: Magnesium: 2.8 mg/dL — ABNORMAL HIGH (ref 1.7–2.4)

## 2018-07-23 NOTE — Progress Notes (Signed)
Patient ID: Harold Martin, male   DOB: [birth year], 78 y.o.   MRN: [id number]  PROGRESS NOTE    Harold Martin  [id number]:[id number] DOB: 21-Jun-1941 DOA: 07/16/2018 PCP: Anda Kraft, MD   Brief Narrative:  78 year old male with history of severe COPD, hypothyroidism, insulin-dependent type 2 diabetes mellitus who lives at home with alone, still walks and ambulates [place] presented on 07/16/2018 with altered mental status when he was found down on the floor unresponsive.  TSH was extremely elevated suggesting most likely of myxedema coma. He was started on iv synthroid.  Assessment & Plan:   Principal Problem:   Acute encephalopathy Active Problems:   EMPHYSEMA   Acute renal failure superimposed on stage 3 chronic kidney disease (HCC)   Type 2 diabetes mellitus with hyperglycemia (HCC)   Essential hypertension   COPD (chronic obstructive pulmonary disease) (HCC)   Metabolic encephalopathy   Pressure injury of skin   Acute metabolic encephalopathy most likely secondary to myxedema coma along with delirium -Currently on intravenous Synthroid with improvement in mental status subsequently. -Currently has episodes of delirium as well.  Monitor mental status.  Use PRN Haldol for extreme agitation and avoid benzodiazepines and narcotics -MRI of the brain was negative for acute abnormality -EEG negative for seizure activities. -Patient was more awake yesterday but still slightly confused.  Acute renal failure/hyponatremia/severe dehydration -Resolved.  Creatinine is 1.82 today.  Renal ultrasound was negative for hydronephrosis.  Foley catheter was discontinued on 07/19/2018. - Repeat AM labs  Rhabdomyolysis -Treated with IV fluids and improved  COPD -Stable.  Use nebs as needed  Hypertension -Blood pressure stable.  Not on any antihypertensives.  Left knee effusion -No need for arthrocentesis at this time as per orthopedic recommendations  Diabetes mellitus type  2 -Continue current insulin regimen along with CBGs and sliding scale insulin.  Blood sugars stable.  Elevated LFTs -Questionable cause.  Improving.  Outpatient follow-up   DVT prophylaxis: Subcutaneous heparin Code Status: Full Family Communication: None at bedside Disposition Plan: SNF in 1 to 2 days if mental status improves  Consultants: None  Procedures: None  Antimicrobials: None   Subjective: Patient seen and examined at bedside.  He is sleepy, hardly wakes up on calling his name.  No overnight fever, vomiting, agitation reported.   Objective: Vitals:   07/23/18 0441 07/23/18 0515 07/23/18 0520 07/23/18 0525  BP: 136/79     Pulse: 88   92  Resp: 18     Temp: 97.7 F (36.5 C)     TempSrc: Oral     SpO2: 100% (!) 86% 90% 96%  Weight:      Height:        Intake/Output Summary (Last 24 hours) at 07/23/2018 0912 Last data filed at 07/23/2018 0515 Gross per 24 hour  Intake 120 ml  Output 200 ml  Net -80 ml   Filed Weights   07/16/18 1920 07/17/18 0347  Weight: 100.1 kg 101.3 kg    Examination:  General exam: Elderly male lying in bed.  Sleepy, hardly wakes up on calling his name.  No distress.   Respiratory system: Bilateral decreased breath sounds at bases, some scattered crackles Cardiovascular system: S1-S2 heard, rate controlled Gastrointestinal system: Abdomen is nondistended, soft and nontender. Normal bowel sounds heard. Extremities: No cyanosis, edema     Data Reviewed: I have personally reviewed following labs and imaging studies  CBC: Recent Labs  Lab 07/16/18 1452  07/18/18 0341 07/19/18 0431 07/20/18 0321 07/22/18 0503  07/23/18 0404  WBC 11.2*   < > 9.4 7.9 7.5 6.6 6.6  NEUTROABS 9.5*  --   --   --   --  5.1 5.1  HGB 13.6   < > 12.0* 11.9* 12.1* 11.5* 10.7*  HCT 44.1   < > 39.0  35.5* 38.8* 38.7* 35.7* 34.3*  MCV 100.2*   < > 103.2* 100.5* 101.8* 100.0 102.1*  PLT 185   < > 159 168 181 143* 151   < > = values in this interval not  displayed.   Basic Metabolic Panel: Recent Labs  Lab 07/19/18 0431 07/20/18 0321 07/21/18 0332 07/22/18 0503 07/22/18 0926 07/23/18 0404  NA 141 141 140  --  142 142  K 4.4 4.0 4.1  --  4.0 4.3  CL 100 98 98  --  99 100  CO2 33* 36* 34*  --  36* 36*  GLUCOSE 175* 169* 191*  --  179* 143*  BUN 34* 30* 26*  --  21 23  CREATININE 2.55* 2.10* 1.80*  --  1.83* 1.82*  CALCIUM 8.2* 8.4* 8.4*  --  8.8* 8.8*  MG 3.2* 3.2* 3.2* 2.9*  --  2.8*   GFR: Estimated Creatinine Clearance: 43.9 mL/min (A) (by C-G formula based on SCr of 1.82 mg/dL (H)). Liver Function Tests: Recent Labs  Lab 07/18/18 0341 07/19/18 0431 07/20/18 0321 07/22/18 0926 07/23/18 0404  AST 123* 83* 71* 44* 44*  ALT 48* 43 42 32 32  ALKPHOS 84 77 80 72 68  BILITOT 1.0 0.8 0.8 0.9 0.8  PROT 6.7 6.6 6.7 6.1* 6.0*  ALBUMIN 3.4* 3.4* 3.6 3.3* 3.3*   No results for input(s): LIPASE, AMYLASE in the last 168 hours. Recent Labs  Lab 07/16/18 1858  AMMONIA 23   Coagulation Profile: Recent Labs  Lab 07/16/18 1452 07/16/18 1858  INR 1.07 0.98   Cardiac Enzymes: Recent Labs  Lab 07/16/18 1452  CKTOTAL 7,876*   BNP (last 3 results) No results for input(s): PROBNP in the last 8760 hours. HbA1C: No results for input(s): HGBA1C in the last 72 hours. CBG: Recent Labs  Lab 07/22/18 1735 07/22/18 2002 07/23/18 0014 07/23/18 0359 07/23/18 0820  GLUCAP 195* 198* 165* 138* 153*   Lipid Profile: No results for input(s): CHOL, HDL, LDLCALC, TRIG, CHOLHDL, LDLDIRECT in the last 72 hours. Thyroid Function Tests: No results for input(s): TSH, T4TOTAL, FREET4, T3FREE, THYROIDAB in the last 72 hours. Anemia Panel: No results for input(s): VITAMINB12, FOLATE, FERRITIN, TIBC, IRON, RETICCTPCT in the last 72 hours. Sepsis Labs: Recent Labs  Lab 07/16/18 1458  LATICACIDVEN 1.90    Recent Results (from the past 240 hour(s))  MRSA PCR Screening     Status: None   Collection Time: 07/16/18  8:03 PM  Result  Value Ref Range Status   MRSA by PCR NEGATIVE NEGATIVE Final    Comment:        The GeneXpert MRSA Assay (FDA approved for NASAL specimens only), is one component of a comprehensive MRSA colonization surveillance program. It is not intended to diagnose MRSA infection nor to guide or monitor treatment for MRSA infections. Performed at [facility], [place] [address], [place], [place] [address]          Radiology Studies: No results found.      Scheduled Meds: . aspirin  81 mg Oral Daily  . chlorhexidine  15 mL Mouth Rinse BID  . heparin  5,000 Units Subcutaneous Q8H  . insulin aspart  0-15 Units Subcutaneous Q4H  . insulin NPH Human  20 Units Subcutaneous QAC breakfast  . levothyroxine  150 mcg Intravenous Q0600  . mouth rinse  15 mL Mouth Rinse BID  . polyethylene glycol  17 g Oral Daily  . tamsulosin  0.4 mg Oral Q1200   Continuous Infusions:    LOS: 7 days        Aline August, MD [facility] Pager [phone]  If 7PM-7AM, please contact night-coverage [email or web address] Password [email or web address] 07/23/2018, 9:12 AM

## 2018-07-23 NOTE — Progress Notes (Addendum)
CSW received insurance approval for patient to admit over the weekend to [facility]. New authorization will be needed if patient is still in the hospital on Monday. Patient's daughter updated and alerted that she will need to sign patient's admission paperwork.  Percell Locus Athalie Newhard LCSW [phone]

## 2018-07-24 LAB — BASIC METABOLIC PANEL
ANION GAP: 9 (ref 5–15)
BUN: 21 mg/dL (ref 8–23)
CALCIUM: 8.9 mg/dL (ref 8.9–10.3)
CO2: 32 mmol/L (ref 22–32)
Chloride: 103 mmol/L (ref 98–111)
Creatinine, Ser: 1.5 mg/dL — ABNORMAL HIGH (ref 0.61–1.24)
GFR calc Af Amer: 52 mL/min — ABNORMAL LOW (ref >60)
GFR calc non Af Amer: 45 mL/min — ABNORMAL LOW (ref >60)
Glucose, Bld: 151 mg/dL — ABNORMAL HIGH (ref 70–99)
Potassium: 4.5 mmol/L (ref 3.5–5.1)
Sodium: 144 mmol/L (ref 135–145)

## 2018-07-24 LAB — CBC WITH DIFFERENTIAL/PLATELET
Abs Immature Granulocytes: 0.12 10*3/uL — ABNORMAL HIGH (ref 0.00–0.07)
BASOS PCT: 1 %
Basophils Absolute: 0 10*3/uL (ref 0.0–0.1)
Eosinophils Absolute: 0.2 10*3/uL (ref 0.0–0.5)
Eosinophils Relative: 3 %
HCT: 35.2 % — ABNORMAL LOW (ref 39.0–52.0)
Hemoglobin: 10.7 g/dL — ABNORMAL LOW (ref 13.0–17.0)
Immature Granulocytes: 2 %
Lymphocytes Relative: 7 %
Lymphs Abs: 0.5 10*3/uL — ABNORMAL LOW (ref 0.7–4.0)
MCH: 30.9 pg (ref 26.0–34.0)
MCHC: 30.4 g/dL (ref 30.0–36.0)
MCV: 101.7 fL — ABNORMAL HIGH (ref 80.0–100.0)
Monocytes Absolute: 0.7 10*3/uL (ref 0.1–1.0)
Monocytes Relative: 11 %
Neutro Abs: 5.3 10*3/uL (ref 1.7–7.7)
Neutrophils Relative %: 76 %
Platelets: 137 10*3/uL — ABNORMAL LOW (ref 150–400)
RBC: 3.46 MIL/uL — AB (ref 4.22–5.81)
RDW: 13.3 % (ref 11.5–15.5)
WBC: 6.9 10*3/uL (ref 4.0–10.5)
nRBC: 0 % (ref 0.0–0.2)

## 2018-07-24 LAB — GLUCOSE, CAPILLARY
Glucose-Capillary: 115 mg/dL — ABNORMAL HIGH (ref 70–99)
Glucose-Capillary: 118 mg/dL — ABNORMAL HIGH (ref 70–99)
Glucose-Capillary: 120 mg/dL — ABNORMAL HIGH (ref 70–99)
Glucose-Capillary: 156 mg/dL — ABNORMAL HIGH (ref 70–99)
Glucose-Capillary: 173 mg/dL — ABNORMAL HIGH (ref 70–99)
Glucose-Capillary: 99 mg/dL (ref 70–99)

## 2018-07-24 LAB — MAGNESIUM: Magnesium: 2.5 mg/dL — ABNORMAL HIGH (ref 1.7–2.4)

## 2018-07-24 MED ORDER — LIOTHYRONINE SODIUM 10 MCG/ML IV SOLN
2.5000 ug | Freq: Three times a day (TID) | INTRAVENOUS | Status: DC
  Administered 2018-07-24 – 2018-07-25 (×2): 2.5 ug via INTRAVENOUS
  Filled 2018-07-24 (×3): qty 0.25

## 2018-07-24 MED ORDER — LIOTHYRONINE SODIUM 10 MCG/ML IV SOLN
2.5000 ug | Freq: Three times a day (TID) | INTRAVENOUS | Status: DC
  Filled 2018-07-24 (×8): qty 0.25

## 2018-07-24 MED ORDER — LEVOTHYROXINE SODIUM 100 MCG/5ML IV SOLN
300.0000 ug | Freq: Every day | INTRAVENOUS | Status: DC
  Administered 2018-07-25: 300 ug via INTRAVENOUS
  Filled 2018-07-24: qty 15

## 2018-07-24 MED ORDER — LIOTHYRONINE SODIUM 10 MCG/ML IV SOLN
20.0000 ug | Freq: Once | INTRAVENOUS | Status: AC
  Administered 2018-07-24: 20 ug via INTRAVENOUS
  Filled 2018-07-24 (×2): qty 2

## 2018-07-24 MED ORDER — LIOTHYRONINE SODIUM 10 MCG/ML IV SOLN
2.5000 ug | Freq: Three times a day (TID) | INTRAVENOUS | Status: DC
  Filled 2018-07-24: qty 0.25

## 2018-07-24 NOTE — Progress Notes (Addendum)
Patient ID: Harold Martin, male   DOB: February 14, 1941, 78 y.o.   MRN: [id number]  PROGRESS NOTE    Harold Martin  WUJ:[id number] DOB: 01/08/1941 DOA: 07/16/2018 PCP: Anda Kraft, MD   Brief Narrative:  78 year old male with history of severe COPD, hypothyroidism, insulin-dependent type 2 diabetes mellitus who lives at home with alone, still walks and ambulates [place] presented on 07/16/2018 with altered mental status when he was found down on the floor unresponsive.  TSH was extremely elevated suggesting most likely of myxedema coma. He was started on iv synthroid.  Assessment & Plan:   Principal Problem:   Acute encephalopathy Active Problems:   EMPHYSEMA   Acute renal failure superimposed on stage 3 chronic kidney disease (HCC)   Type 2 diabetes mellitus with hyperglycemia (HCC)   Essential hypertension   COPD (chronic obstructive pulmonary disease) (HCC)   Metabolic encephalopathy   Pressure injury of skin   Acute metabolic encephalopathy most likely secondary to myxedema coma along with delirium -Currently on intravenous Synthroid.  Will increase to 300 mcg daily.  We will also start IV liothyronine -Currently has episodes of delirium as well.  Monitor mental status.  Use PRN Haldol for extreme agitation and avoid benzodiazepines and narcotics -MRI of the brain was negative for acute abnormality -EEG negative for seizure activities. -Mental status is still fluctuating and not with much improvement over the last couple of days.  Acute renal failure/hyponatremia/severe dehydration -Resolved.  Creatinine is 1.50 today.  Renal ultrasound was negative for hydronephrosis.  Foley catheter was discontinued on 07/19/2018. -Monitor creatinine.  Rhabdomyolysis -Treated with IV fluids and improved  COPD -Stable.  Use nebs as needed  Hypertension -Blood pressure stable.  Not on any antihypertensives.  Left knee effusion -No need for arthrocentesis at this time as per  orthopedic recommendations  Diabetes mellitus type 2 -Continue current insulin regimen along with CBGs and sliding scale insulin.  Blood sugars stable.  Elevated LFTs -Questionable cause.  Improving.  Outpatient follow-up   DVT prophylaxis: Subcutaneous heparin Code Status: Full Family Communication: None at bedside Disposition Plan: SNF in 1 to 2 days if mental status improves  Consultants: None  Procedures: None  Antimicrobials: None   Subjective: Patient seen and examined at bedside.  He is sleeping, hardly wakes up on calling his name.  No overnight agitation, fever, vomiting reported by nursing staff. Objective: Vitals:   07/23/18 0525 07/23/18 1410 07/23/18 2038 07/24/18 0543  BP:  122/70 112/63 112/67  Pulse: 92 94 90 95  Resp:  20 18 18   Temp:  98 F (36.7 C) 98.1 F (36.7 C) 98.2 F (36.8 C)  TempSrc:  Oral Oral Oral  SpO2: 96% 97% 100% 94%  Weight:      Height:        Intake/Output Summary (Last 24 hours) at 07/24/2018 0859 Last data filed at 07/23/2018 1900 Gross per 24 hour  Intake -  Output 450 ml  Net -450 ml   Filed Weights   07/16/18 1920 07/17/18 0347  Weight: 100.1 kg 101.3 kg    Examination:  General exam: No acute distress.  Sleeping, hardly wakes up on calling his name.   Respiratory system: Bilateral decreased breath sounds at bases, some scattered crackles.  No wheezing Cardiovascular system: Rate controlled, S1-S2 heard Gastrointestinal system: Abdomen is nondistended, soft and nontender. Normal bowel sounds heard. Extremities: No cyanosis, edema     Data Reviewed: I have personally reviewed following labs and imaging studies  CBC: Recent Labs  Lab 07/19/18 0431 07/20/18 0321 07/22/18 0503 07/23/18 0404 07/24/18 0348  WBC 7.9 7.5 6.6 6.6 6.9  NEUTROABS  --   --  5.1 5.1 5.3  HGB 11.9* 12.1* 11.5* 10.7* 10.7*  HCT 38.8* 38.7* 35.7* 34.3* 35.2*  MCV 100.5* 101.8* 100.0 102.1* 101.7*  PLT 168 181 143* 151 137*   Basic  Metabolic Panel: Recent Labs  Lab 07/20/18 0321 07/21/18 0332 07/22/18 0503 07/22/18 0926 07/23/18 0404 07/24/18 0348  NA 141 140  --  142 142 144  K 4.0 4.1  --  4.0 4.3 4.5  CL 98 98  --  99 100 103  CO2 36* 34*  --  36* 36* 32  GLUCOSE 169* 191*  --  179* 143* 151*  BUN 30* 26*  --  21 23 21   CREATININE 2.10* 1.80*  --  1.83* 1.82* 1.50*  CALCIUM 8.4* 8.4*  --  8.8* 8.8* 8.9  MG 3.2* 3.2* 2.9*  --  2.8* 2.5*   GFR: Estimated Creatinine Clearance: 53.3 mL/min (A) (by C-G formula based on SCr of 1.5 mg/dL (H)). Liver Function Tests: Recent Labs  Lab 07/18/18 0341 07/19/18 0431 07/20/18 0321 07/22/18 0926 07/23/18 0404  AST 123* 83* 71* 44* 44*  ALT 48* 43 42 32 32  ALKPHOS 84 77 80 72 68  BILITOT 1.0 0.8 0.8 0.9 0.8  PROT 6.7 6.6 6.7 6.1* 6.0*  ALBUMIN 3.4* 3.4* 3.6 3.3* 3.3*   No results for input(s): LIPASE, AMYLASE in the last 168 hours. No results for input(s): AMMONIA in the last 168 hours. Coagulation Profile: No results for input(s): INR, PROTIME in the last 168 hours. Cardiac Enzymes: No results for input(s): CKTOTAL, CKMB, CKMBINDEX, TROPONINI in the last 168 hours. BNP (last 3 results) No results for input(s): PROBNP in the last 8760 hours. HbA1C: No results for input(s): HGBA1C in the last 72 hours. CBG: Recent Labs  Lab 07/23/18 1624 07/23/18 2036 07/24/18 0016 07/24/18 0514 07/24/18 0753  GLUCAP 216* 153* 99 173* 156*   Lipid Profile: No results for input(s): CHOL, HDL, LDLCALC, TRIG, CHOLHDL, LDLDIRECT in the last 72 hours. Thyroid Function Tests: No results for input(s): TSH, T4TOTAL, FREET4, T3FREE, THYROIDAB in the last 72 hours. Anemia Panel: No results for input(s): VITAMINB12, FOLATE, FERRITIN, TIBC, IRON, RETICCTPCT in the last 72 hours. Sepsis Labs: No results for input(s): PROCALCITON, LATICACIDVEN in the last 168 hours.  Recent Results (from the past 240 hour(s))  MRSA PCR Screening     Status: None   Collection Time:  07/16/18  8:03 PM  Result Value Ref Range Status   MRSA by PCR NEGATIVE NEGATIVE Final    Comment:        The GeneXpert MRSA Assay (FDA approved for NASAL specimens only), is one component of a comprehensive MRSA colonization surveillance program. It is not intended to diagnose MRSA infection nor to guide or monitor treatment for MRSA infections. Performed at [facility], [place] [address], [place], [place] [address]          Radiology Studies: No results found.      Scheduled Meds: . aspirin  81 mg Oral Daily  . chlorhexidine  15 mL Mouth Rinse BID  . heparin  5,000 Units Subcutaneous Q8H  . insulin aspart  0-15 Units Subcutaneous Q4H  . insulin NPH Human  20 Units Subcutaneous QAC breakfast  . levothyroxine  150 mcg Intravenous Q0600  . mouth rinse  15 mL Mouth Rinse BID  .  polyethylene glycol  17 g Oral Daily  . tamsulosin  0.4 mg Oral Q1200   Continuous Infusions:    LOS: 8 days        Aline August, MD [facility] Pager [phone]  If 7PM-7AM, please contact night-coverage [email or web address] Password [email or web address] 07/24/2018, 8:59 AM

## 2018-07-25 LAB — CBC WITH DIFFERENTIAL/PLATELET
Abs Immature Granulocytes: 0.08 10*3/uL — ABNORMAL HIGH (ref 0.00–0.07)
Basophils Absolute: 0 10*3/uL (ref 0.0–0.1)
Basophils Relative: 1 %
Eosinophils Absolute: 0.2 10*3/uL (ref 0.0–0.5)
Eosinophils Relative: 3 %
HCT: 36 % — ABNORMAL LOW (ref 39.0–52.0)
Hemoglobin: 10.9 g/dL — ABNORMAL LOW (ref 13.0–17.0)
Immature Granulocytes: 1 %
Lymphocytes Relative: 6 %
Lymphs Abs: 0.4 10*3/uL — ABNORMAL LOW (ref 0.7–4.0)
MCH: 31.1 pg (ref 26.0–34.0)
MCHC: 30.3 g/dL (ref 30.0–36.0)
MCV: 102.6 fL — ABNORMAL HIGH (ref 80.0–100.0)
Monocytes Absolute: 0.8 10*3/uL (ref 0.1–1.0)
Monocytes Relative: 12 %
NRBC: 0 % (ref 0.0–0.2)
Neutro Abs: 5.3 10*3/uL (ref 1.7–7.7)
Neutrophils Relative %: 77 %
PLATELETS: 141 10*3/uL — AB (ref 150–400)
RBC: 3.51 MIL/uL — ABNORMAL LOW (ref 4.22–5.81)
RDW: 13.5 % (ref 11.5–15.5)
WBC: 6.8 10*3/uL (ref 4.0–10.5)

## 2018-07-25 LAB — GLUCOSE, CAPILLARY
GLUCOSE-CAPILLARY: 172 mg/dL — AB (ref 70–99)
Glucose-Capillary: 137 mg/dL — ABNORMAL HIGH (ref 70–99)
Glucose-Capillary: 139 mg/dL — ABNORMAL HIGH (ref 70–99)
Glucose-Capillary: 140 mg/dL — ABNORMAL HIGH (ref 70–99)
Glucose-Capillary: 147 mg/dL — ABNORMAL HIGH (ref 70–99)
Glucose-Capillary: 152 mg/dL — ABNORMAL HIGH (ref 70–99)

## 2018-07-25 LAB — COMPREHENSIVE METABOLIC PANEL
ALT: 40 U/L (ref 0–44)
ANION GAP: 11 (ref 5–15)
AST: 49 U/L — ABNORMAL HIGH (ref 15–41)
Albumin: 3.2 g/dL — ABNORMAL LOW (ref 3.5–5.0)
Alkaline Phosphatase: 85 U/L (ref 38–126)
BUN: 21 mg/dL (ref 8–23)
CO2: 32 mmol/L (ref 22–32)
Calcium: 8.9 mg/dL (ref 8.9–10.3)
Chloride: 102 mmol/L (ref 98–111)
Creatinine, Ser: 1.55 mg/dL — ABNORMAL HIGH (ref 0.61–1.24)
GFR calc Af Amer: 50 mL/min — ABNORMAL LOW (ref >60)
GFR calc non Af Amer: 43 mL/min — ABNORMAL LOW (ref >60)
Glucose, Bld: 154 mg/dL — ABNORMAL HIGH (ref 70–99)
POTASSIUM: 4.4 mmol/L (ref 3.5–5.1)
Sodium: 145 mmol/L (ref 135–145)
Total Bilirubin: 1.1 mg/dL (ref 0.3–1.2)
Total Protein: 6 g/dL — ABNORMAL LOW (ref 6.5–8.1)

## 2018-07-25 LAB — MAGNESIUM: MAGNESIUM: 2.6 mg/dL — AB (ref 1.7–2.4)

## 2018-07-25 MED ORDER — LIOTHYRONINE SODIUM 10 MCG/ML IV SOLN
5.0000 ug | Freq: Three times a day (TID) | INTRAVENOUS | Status: DC
  Administered 2018-07-25 – 2018-07-26 (×3): 5 ug via INTRAVENOUS
  Filled 2018-07-25 (×9): qty 0.5

## 2018-07-25 MED ORDER — LEVOTHYROXINE SODIUM 100 MCG/5ML IV SOLN
100.0000 ug | Freq: Every day | INTRAVENOUS | Status: DC
  Administered 2018-07-26 – 2018-07-27 (×2): 100 ug via INTRAVENOUS
  Filled 2018-07-25 (×2): qty 5

## 2018-07-25 NOTE — Progress Notes (Signed)
Patient ID: Harold Martin, male   DOB: Aug 07, 1940, 78 y.o.   MRN: [id number]  PROGRESS NOTE    Harold Martin  GHW:[id number] DOB: 11/30/1940 DOA: 07/16/2018 PCP: Anda Kraft, MD   Brief Narrative:  78 year old male with history of severe COPD, hypothyroidism, insulin-dependent type 2 diabetes mellitus who lives at home with alone, still walks and ambulates [place] a car presented on 07/16/2018 with altered mental status when he was found down on the floor unresponsive.  TSH was extremely elevated suggesting most likely of myxedema coma. He was started on iv synthroid.  Assessment & Plan:   Principal Problem:   Acute encephalopathy Active Problems:   EMPHYSEMA   Acute renal failure superimposed on stage 3 chronic kidney disease (HCC)   Type 2 diabetes mellitus with hyperglycemia (HCC)   Essential hypertension   COPD (chronic obstructive pulmonary disease) (HCC)   Metabolic encephalopathy   Pressure injury of skin   Acute metabolic encephalopathy most likely secondary to myxedema coma along with delirium -Currently on intravenous Synthroid.  Will increase to 400 mcg daily.  Increase IV liothyronine to 5 mcg 3 times a day -Currently has episodes of delirium as well.  Monitor mental status.  Use PRN Haldol for extreme agitation and avoid benzodiazepines and narcotics -MRI of the brain was negative for acute abnormality -EEG negative for seizure activities. -Mental status is still fluctuating and not with much improvement over the last couple of days.  If no improvement in mental status by tomorrow, might have to consider neurology evaluation.  Acute renal failure/hyponatremia/severe dehydration -Resolved.  Creatinine is 1.55 today.  Renal ultrasound was negative for hydronephrosis.  Foley catheter was discontinued on 07/19/2018. -Monitor creatinine.  Rhabdomyolysis -Treated with IV fluids and improved  COPD -Stable.  Use nebs as needed  Hypertension -Blood pressure  stable.  Not on any antihypertensives.  Left knee effusion -No need for arthrocentesis at this time as per orthopedic recommendations  Diabetes mellitus type 2 -Continue current insulin regimen along with CBGs and sliding scale insulin.  Blood sugars stable.  Elevated LFTs -Questionable cause.  Improving.  Outpatient follow-up   DVT prophylaxis: Subcutaneous heparin Code Status: Full Family Communication: None at bedside Disposition Plan: SNF once mental status improves Consultants: None  Procedures: None  Antimicrobials: None   Subjective: Patient seen and examined at bedside.  He is sleeping, hardly wakes up on calling his name.  As per the nursing staff, he would only wake up intermittently and ate 25% of his food yesterday.  No overnight seizure or agitation  objective: Vitals:   07/24/18 0543 07/24/18 1428 07/24/18 2023 07/25/18 0456  BP: 112/67 116/68 102/73 (!) 139/91  Pulse: 95 (!) 101 99 (!) 103  Resp: 18 20 20 20   Temp: 98.2 F (36.8 C) 99 F (37.2 C) 98.9 F (37.2 C) 98.6 F (37 C)  TempSrc: Oral Oral Oral Oral  SpO2: 94% 97% 97% 100%  Weight:      Height:        Intake/Output Summary (Last 24 hours) at 07/25/2018 0942 Last data filed at 07/25/2018 0454 Gross per 24 hour  Intake -  Output 450 ml  Net -450 ml   Filed Weights   07/16/18 1920 07/17/18 0347  Weight: 100.1 kg 101.3 kg    Examination:  General exam: No distress.  Sleeping, hardly wakes up on calling his name.   Respiratory system: Bilateral decreased breath sounds at bases, some scattered crackles.  Cardiovascular system: Rate controlled, S1-S2 heard  Gastrointestinal system: Abdomen is nondistended, soft and nontender. Normal bowel sounds heard. Extremities: No cyanosis, trace edema    Data Reviewed: I have personally reviewed following labs and imaging studies  CBC: Recent Labs  Lab 07/20/18 0321 07/22/18 0503 07/23/18 0404 07/24/18 0348 07/25/18 0510  WBC 7.5 6.6 6.6 6.9  6.8  NEUTROABS  --  5.1 5.1 5.3 5.3  HGB 12.1* 11.5* 10.7* 10.7* 10.9*  HCT 38.7* 35.7* 34.3* 35.2* 36.0*  MCV 101.8* 100.0 102.1* 101.7* 102.6*  PLT 181 143* 151 137* 811*   Basic Metabolic Panel: Recent Labs  Lab 07/21/18 0332 07/22/18 0503 07/22/18 0926 07/23/18 0404 07/24/18 0348 07/25/18 0510  NA 140  --  142 142 144 145  K 4.1  --  4.0 4.3 4.5 4.4  CL 98  --  99 100 103 102  CO2 34*  --  36* 36* 32 32  GLUCOSE 191*  --  179* 143* 151* 154*  BUN 26*  --  21 23 21 21   CREATININE 1.80*  --  1.83* 1.82* 1.50* 1.55*  CALCIUM 8.4*  --  8.8* 8.8* 8.9 8.9  MG 3.2* 2.9*  --  2.8* 2.5* 2.6*   GFR: Estimated Creatinine Clearance: 51.5 mL/min (A) (by C-G formula based on SCr of 1.55 mg/dL (H)). Liver Function Tests: Recent Labs  Lab 07/19/18 0431 07/20/18 0321 07/22/18 0926 07/23/18 0404 07/25/18 0510  AST 83* 71* 44* 44* 49*  ALT 43 42 32 32 40  ALKPHOS 77 80 72 68 85  BILITOT 0.8 0.8 0.9 0.8 1.1  PROT 6.6 6.7 6.1* 6.0* 6.0*  ALBUMIN 3.4* 3.6 3.3* 3.3* 3.2*   No results for input(s): LIPASE, AMYLASE in the last 168 hours. No results for input(s): AMMONIA in the last 168 hours. Coagulation Profile: No results for input(s): INR, PROTIME in the last 168 hours. Cardiac Enzymes: No results for input(s): CKTOTAL, CKMB, CKMBINDEX, TROPONINI in the last 168 hours. BNP (last 3 results) No results for input(s): PROBNP in the last 8760 hours. HbA1C: No results for input(s): HGBA1C in the last 72 hours. CBG: Recent Labs  Lab 07/24/18 1704 07/24/18 1956 07/25/18 0036 07/25/18 0449 07/25/18 0813  GLUCAP 115* 118* 137* 140* 152*   Lipid Profile: No results for input(s): CHOL, HDL, LDLCALC, TRIG, CHOLHDL, LDLDIRECT in the last 72 hours. Thyroid Function Tests: No results for input(s): TSH, T4TOTAL, FREET4, T3FREE, THYROIDAB in the last 72 hours. Anemia Panel: No results for input(s): VITAMINB12, FOLATE, FERRITIN, TIBC, IRON, RETICCTPCT in the last 72 hours. Sepsis  Labs: No results for input(s): PROCALCITON, LATICACIDVEN in the last 168 hours.  Recent Results (from the past 240 hour(s))  MRSA PCR Screening     Status: None   Collection Time: 07/16/18  8:03 PM  Result Value Ref Range Status   MRSA by PCR NEGATIVE NEGATIVE Final    Comment:        The GeneXpert MRSA Assay (FDA approved for NASAL specimens only), is one component of a comprehensive MRSA colonization surveillance program. It is not intended to diagnose MRSA infection nor to guide or monitor treatment for MRSA infections. Performed at [facility], [place] [address], [place], [place] [address]          Radiology Studies: No results found.      Scheduled Meds: . aspirin  81 mg Oral Daily  . chlorhexidine  15 mL Mouth Rinse BID  . heparin  5,000 Units Subcutaneous Q8H  . insulin aspart  0-15 Units Subcutaneous  Q4H  . insulin NPH Human  20 Units Subcutaneous QAC breakfast  . levothyroxine  300 mcg Intravenous Q0600  . Liothyronine Sodium  2.5 mcg Intravenous Q8H  . mouth rinse  15 mL Mouth Rinse BID  . polyethylene glycol  17 g Oral Daily  . tamsulosin  0.4 mg Oral Q1200   Continuous Infusions:    LOS: 9 days        Aline August, MD [facility] Pager [phone]  If 7PM-7AM, please contact night-coverage [email or web address] Password [facility] 07/25/2018, 9:42 AM

## 2018-07-26 ENCOUNTER — Inpatient Hospital Stay (HOSPITAL_COMMUNITY): Payer: Medicare Other

## 2018-07-26 DIAGNOSIS — R0609 Other forms of dyspnea: Secondary | ICD-10-CM

## 2018-07-26 DIAGNOSIS — R4182 Altered mental status, unspecified: Secondary | ICD-10-CM

## 2018-07-26 DIAGNOSIS — E039 Hypothyroidism, unspecified: Secondary | ICD-10-CM

## 2018-07-26 DIAGNOSIS — G9341 Metabolic encephalopathy: Secondary | ICD-10-CM

## 2018-07-26 LAB — BASIC METABOLIC PANEL
Anion gap: 11 (ref 5–15)
BUN: 25 mg/dL — ABNORMAL HIGH (ref 8–23)
CHLORIDE: 101 mmol/L (ref 98–111)
CO2: 33 mmol/L — AB (ref 22–32)
Calcium: 8.9 mg/dL (ref 8.9–10.3)
Creatinine, Ser: 1.57 mg/dL — ABNORMAL HIGH (ref 0.61–1.24)
GFR calc Af Amer: 49 mL/min — ABNORMAL LOW (ref >60)
GFR calc non Af Amer: 42 mL/min — ABNORMAL LOW (ref >60)
Glucose, Bld: 159 mg/dL — ABNORMAL HIGH (ref 70–99)
Potassium: 4.5 mmol/L (ref 3.5–5.1)
Sodium: 145 mmol/L (ref 135–145)

## 2018-07-26 LAB — MAGNESIUM: Magnesium: 2.6 mg/dL — ABNORMAL HIGH (ref 1.7–2.4)

## 2018-07-26 LAB — URINALYSIS, COMPLETE (UACMP) WITH MICROSCOPIC
BILIRUBIN URINE: NEGATIVE
Glucose, UA: NEGATIVE mg/dL
HGB URINE DIPSTICK: NEGATIVE
Ketones, ur: NEGATIVE mg/dL
Leukocytes, UA: NEGATIVE
Nitrite: NEGATIVE
Protein, ur: NEGATIVE mg/dL
Specific Gravity, Urine: 1.025 (ref 1.005–1.030)
pH: 5 (ref 5.0–8.0)

## 2018-07-26 LAB — CBC WITH DIFFERENTIAL/PLATELET
Abs Immature Granulocytes: 0.08 10*3/uL — ABNORMAL HIGH (ref 0.00–0.07)
Basophils Absolute: 0 10*3/uL (ref 0.0–0.1)
Basophils Relative: 1 %
Eosinophils Absolute: 0.2 10*3/uL (ref 0.0–0.5)
Eosinophils Relative: 2 %
HCT: 34.6 % — ABNORMAL LOW (ref 39.0–52.0)
HEMOGLOBIN: 10.6 g/dL — AB (ref 13.0–17.0)
Immature Granulocytes: 1 %
Lymphocytes Relative: 6 %
Lymphs Abs: 0.4 10*3/uL — ABNORMAL LOW (ref 0.7–4.0)
MCH: 31.8 pg (ref 26.0–34.0)
MCHC: 30.6 g/dL (ref 30.0–36.0)
MCV: 103.9 fL — ABNORMAL HIGH (ref 80.0–100.0)
MONO ABS: 0.8 10*3/uL (ref 0.1–1.0)
MONOS PCT: 12 %
Neutro Abs: 5 10*3/uL (ref 1.7–7.7)
Neutrophils Relative %: 78 %
Platelets: 154 10*3/uL (ref 150–400)
RBC: 3.33 MIL/uL — ABNORMAL LOW (ref 4.22–5.81)
RDW: 13.7 % (ref 11.5–15.5)
WBC: 6.4 10*3/uL (ref 4.0–10.5)
nRBC: 0 % (ref 0.0–0.2)

## 2018-07-26 LAB — GLUCOSE, CAPILLARY
Glucose-Capillary: 129 mg/dL — ABNORMAL HIGH (ref 70–99)
Glucose-Capillary: 145 mg/dL — ABNORMAL HIGH (ref 70–99)
Glucose-Capillary: 151 mg/dL — ABNORMAL HIGH (ref 70–99)
Glucose-Capillary: 152 mg/dL — ABNORMAL HIGH (ref 70–99)
Glucose-Capillary: 158 mg/dL — ABNORMAL HIGH (ref 70–99)
Glucose-Capillary: 171 mg/dL — ABNORMAL HIGH (ref 70–99)

## 2018-07-26 LAB — BLOOD GAS, ARTERIAL
Acid-Base Excess: 9.6 mmol/L — ABNORMAL HIGH (ref 0.0–2.0)
Bicarbonate: 35.1 mmol/L — ABNORMAL HIGH (ref 20.0–28.0)
Drawn by: 30136
O2 Content: 2 L/min
O2 Saturation: 95.5 %
Patient temperature: 98.6
pCO2 arterial: 62.2 mmHg — ABNORMAL HIGH (ref 32.0–48.0)
pH, Arterial: 7.37 (ref 7.350–7.450)
pO2, Arterial: 74.7 mmHg — ABNORMAL LOW (ref 83.0–108.0)

## 2018-07-26 LAB — ECHOCARDIOGRAM COMPLETE
Height: 73 in
Weight: 3573.22 oz

## 2018-07-26 LAB — TSH: TSH: 8.52 u[IU]/mL — ABNORMAL HIGH (ref 0.350–4.500)

## 2018-07-26 LAB — T4, FREE: Free T4: 1.27 ng/dL (ref 0.82–1.77)

## 2018-07-26 MED ORDER — PERFLUTREN LIPID MICROSPHERE
1.0000 mL | INTRAVENOUS | Status: AC | PRN
  Administered 2018-07-26: 4 mL via INTRAVENOUS
  Filled 2018-07-26: qty 10

## 2018-07-26 MED ORDER — LIOTHYRONINE SODIUM 5 MCG PO TABS
5.0000 ug | ORAL_TABLET | Freq: Three times a day (TID) | ORAL | Status: DC
  Administered 2018-07-26 – 2018-07-28 (×6): 5 ug via ORAL
  Filled 2018-07-26 (×7): qty 1

## 2018-07-26 MED ORDER — SODIUM CHLORIDE 3 % IN NEBU
4.0000 mL | INHALATION_SOLUTION | Freq: Four times a day (QID) | RESPIRATORY_TRACT | Status: DC
  Administered 2018-07-26 – 2018-07-29 (×11): 4 mL via RESPIRATORY_TRACT
  Filled 2018-07-26 (×13): qty 4

## 2018-07-26 NOTE — Progress Notes (Signed)
[place] [facility] 78 yo male with hx of severe COPD, hypothyroidism, DM, CKD admitted for found down unresponsive at home on 07/16/2018. He was found to have severe AKI on CKD, dehydration, rhabdomyolysis, myxedema coma.   His TSH on admission was 87 and was given IV synthroid and IV steroids and condition improved. TSH subsequently down to 39 and today at 8.5. FT4 1.27 normal. His IV steroids has stopped, continued on IV synthroid.   He came in with Cre 3.8 with baseline 2.0 and dehydration. he was treated with IVF and Cre improved and today 1.57.   He was concerning to have hypoxia and hypercapnia on admission but ABG showed on admission showed pCO2 55 and PH 7.4. however, after admission he was found to have desat to 80% and was treated with NRB and NTS but CXR on acute findings.   With treatment, pt mental status became improving initially, more awake alert but not orientated. MRI negative for acute finding. EEG no seizure but generally slow. However, he started to have delirium on 07/21/18 and repeat EEG again no seizure and just diffuse slowing. His mental status continues to fluctuate and more drowsy sleep and hard to wake up 07/25/18. Neurology was called back for this reason. As per chart pt baseline lives alone and ambulates and still driving and taking care of himself. His Ammonia level 23 on admission. Repeat today pending. His B1, FA and B12 WNL. His A1C 7.5 and glucose stable on NPH with SSI. Vital stable, no fever.   SUBJECTIVE (INTERVAL HISTORY) His RN is at the bedside.  Pt lying in bed, sleepy but easily arousable. Able to tell me his name but the perservarated on it with most further questions. Disorientated to age, place or time. Still has copious secretions and gurgling sound. On 2L nasal cannula. Severe dysarthria. No focal neuro deficit.    OBJECTIVE Temp:  [98.1 F (36.7 C)-98.7 F (37.1 C)] 98.1 F (36.7 C) (01/20 0514) Pulse Rate:  [97-104]  97 (01/20 0514) Resp:  [16-26] 16 (01/20 0514) BP: (119-135)/(70-72) 121/70 (01/20 0514) SpO2:  [85 %-97 %] 94 % (01/20 0514)  Recent Labs  Lab 07/25/18 1719 07/25/18 2007 07/26/18 0054 07/26/18 0511 07/26/18 0811  GLUCAP 139* 172* 151* 158* 171*   Recent Labs  Lab 07/22/18 0503 07/22/18 0926 07/23/18 0404 07/24/18 0348 07/25/18 0510 07/26/18 0401  NA  --  142 142 144 145 145  K  --  4.0 4.3 4.5 4.4 4.5  CL  --  99 100 103 102 101  CO2  --  36* 36* 32 32 33*  GLUCOSE  --  179* 143* 151* 154* 159*  BUN  --  21 23 21 21  25*  CREATININE  --  1.83* 1.82* 1.50* 1.55* 1.57*  CALCIUM  --  8.8* 8.8* 8.9 8.9 8.9  MG 2.9*  --  2.8* 2.5* 2.6* 2.6*   Recent Labs  Lab 07/20/18 0321 07/22/18 0926 07/23/18 0404 07/25/18 0510  AST 71* 44* 44* 49*  ALT 42 32 32 40  ALKPHOS 80 72 68 85  BILITOT 0.8 0.9 0.8 1.1  PROT 6.7 6.1* 6.0* 6.0*  ALBUMIN 3.6 3.3* 3.3* 3.2*   Recent Labs  Lab 07/22/18 0503 07/23/18 0404 07/24/18 0348 07/25/18 0510 07/26/18 0401  WBC 6.6 6.6 6.9 6.8 6.4  NEUTROABS 5.1 5.1 5.3 5.3 5.0  HGB 11.5* 10.7* 10.7* 10.9* 10.6*  HCT 35.7* 34.3* 35.2* 36.0* 34.6*  MCV 100.0 102.1* 101.7*  102.6* 103.9*  PLT 143* 151 137* 141* 154   No results for input(s): CKTOTAL, CKMB, CKMBINDEX, TROPONINI in the last 168 hours. No results for input(s): LABPROT, INR in the last 72 hours. No results for input(s): COLORURINE, LABSPEC, [place], GLUCOSEU, HGBUR, BILIRUBINUR, KETONESUR, PROTEINUR, UROBILINOGEN, NITRITE, LEUKOCYTESUR in the last 72 hours.  Invalid input(s): APPERANCEUR     Component Value Date/Time   CHOL 180 02/25/2015 0220   TRIG 251 (H) 02/25/2015 0220   HDL 25 (L) 02/25/2015 0220   CHOLHDL 7.2 02/25/2015 0220   VLDL 50 (H) 02/25/2015 0220   LDLCALC 105 (H) 02/25/2015 0220   Lab Results  Component Value Date   HGBA1C 7.5 (H) 07/16/2018      Component Value Date/Time   LABOPIA NONE DETECTED 03/04/2016 0311   COCAINSCRNUR NONE DETECTED 03/04/2016  0311   LABBENZ NONE DETECTED 03/04/2016 0311   AMPHETMU NONE DETECTED 03/04/2016 0311   THCU NONE DETECTED 03/04/2016 0311   LABBARB NONE DETECTED 03/04/2016 0311    No results for input(s): ETH in the last 168 hours.  I have personally reviewed the radiological images below and agree with the radiology interpretations.  Dg Chest 2 View  Result Date: 07/19/2018 CLINICAL DATA:  Cough, question aspiration pneumonia or pneumonia EXAM: CHEST - 2 VIEW COMPARISON:  07/17/2018 FINDINGS: Upper normal heart size. Mediastinal contours and pulmonary vascularity normal. Atherosclerotic calcification aorta. Small amount of barium at and presumed barium tablet are identified within thoracic esophagus. Lungs clear. No pleural effusion or pneumothorax. No acute osseous findings. IMPRESSION: Retained barium and presumed barium tablet within thoracic esophagus. No acute pulmonary abnormalities. Electronically Signed   By: Lavonia Dana M.D.   On: 07/19/2018 13:47   Ct Head Wo Contrast  Result Date: 07/16/2018 CLINICAL DATA:  78 year old male with history of trauma. Altered mental status. Found down. EXAM: CT HEAD WITHOUT CONTRAST CT CERVICAL SPINE WITHOUT CONTRAST TECHNIQUE: Multidetector CT imaging of the head and cervical spine was performed following the standard protocol without intravenous contrast. Multiplanar CT image reconstructions of the cervical spine were also generated. COMPARISON:  Head CT 03/07/2017.  Cervical spine CT 03/03/2016. FINDINGS: CT HEAD FINDINGS Brain: Mild cerebral atrophy. Patchy and confluent areas of decreased attenuation are noted throughout the deep and periventricular white matter of the cerebral hemispheres bilaterally, compatible with chronic microvascular ischemic disease. No evidence of acute infarction, hemorrhage, hydrocephalus, extra-axial collection or mass lesion/mass effect. Vascular: No hyperdense vessel or unexpected calcification. Skull: Normal. Negative for fracture or  focal lesion. Sinuses/Orbits: Small mucosal retention cyst or polyp in the anterior aspect of the left maxillary sinus incidentally noted. No acute finding. Other: None. CT CERVICAL SPINE FINDINGS Alignment: Exaggerated lordosis at C2-C3, likely positional. Alignment is otherwise anatomic. Skull base and vertebrae: Status post ACDF at C3-C4 with complete bony fusion at this level. No acute fracture. No primary bone lesion or focal pathologic process. Soft tissues and spinal canal: No prevertebral fluid or swelling. No visible canal hematoma. Disc levels: Multilevel degenerative disc disease, most severe at C6-C7 and T1-T2. Moderate multilevel facet arthropathy. Upper chest: Emphysema. Other: None. IMPRESSION: 1. No evidence of significant acute traumatic injury to the skull, brain or cervical spine. 2. Mild cerebral atrophy with chronic microvascular ischemic changes in the cerebral white matter. 3. Multilevel degenerative disc disease and cervical spondylosis, with postoperative changes of ACDF at C3-C4, as above. Electronically Signed   By: Vinnie Langton M.D.   On: 07/16/2018 16:24   Ct Cervical Spine Wo Contrast  Result  Date: 07/16/2018 CLINICAL DATA:  78 year old male with history of trauma. Altered mental status. Found down. EXAM: CT HEAD WITHOUT CONTRAST CT CERVICAL SPINE WITHOUT CONTRAST TECHNIQUE: Multidetector CT imaging of the head and cervical spine was performed following the standard protocol without intravenous contrast. Multiplanar CT image reconstructions of the cervical spine were also generated. COMPARISON:  Head CT 03/07/2017.  Cervical spine CT 03/03/2016. FINDINGS: CT HEAD FINDINGS Brain: Mild cerebral atrophy. Patchy and confluent areas of decreased attenuation are noted throughout the deep and periventricular white matter of the cerebral hemispheres bilaterally, compatible with chronic microvascular ischemic disease. No evidence of acute infarction, hemorrhage, hydrocephalus,  extra-axial collection or mass lesion/mass effect. Vascular: No hyperdense vessel or unexpected calcification. Skull: Normal. Negative for fracture or focal lesion. Sinuses/Orbits: Small mucosal retention cyst or polyp in the anterior aspect of the left maxillary sinus incidentally noted. No acute finding. Other: None. CT CERVICAL SPINE FINDINGS Alignment: Exaggerated lordosis at C2-C3, likely positional. Alignment is otherwise anatomic. Skull base and vertebrae: Status post ACDF at C3-C4 with complete bony fusion at this level. No acute fracture. No primary bone lesion or focal pathologic process. Soft tissues and spinal canal: No prevertebral fluid or swelling. No visible canal hematoma. Disc levels: Multilevel degenerative disc disease, most severe at C6-C7 and T1-T2. Moderate multilevel facet arthropathy. Upper chest: Emphysema. Other: None. IMPRESSION: 1. No evidence of significant acute traumatic injury to the skull, brain or cervical spine. 2. Mild cerebral atrophy with chronic microvascular ischemic changes in the cerebral white matter. 3. Multilevel degenerative disc disease and cervical spondylosis, with postoperative changes of ACDF at C3-C4, as above. Electronically Signed   By: Vinnie Langton M.D.   On: 07/16/2018 16:24   Mr Brain Wo Contrast  Result Date: 07/17/2018 CLINICAL DATA:  Altered level of consciousness, unexplained. Encephalopathy. EXAM: MRI HEAD WITHOUT CONTRAST TECHNIQUE: Multiplanar, multiecho pulse sequences of the brain and surrounding structures were obtained without intravenous contrast. COMPARISON:  CT head without contrast 07/16/2018 FINDINGS: Brain: Study is mildly degraded by patient motion. Mild generalized atrophy and white matter disease is within normal limits for age. No acute infarct, hemorrhage, or mass lesion is present. The ventricles are of proportionate to the degree of atrophy. No significant extraaxial fluid collection is present. The internal auditory canals are  within normal limits. The brainstem and cerebellum are within normal limits. Vascular: Flow is present in the major intracranial arteries. Skull and upper cervical spine: Cervical fusion is noted at C3-4. There is slight retrolisthesis and disc disease at C2-3. The craniocervical junction is normal. Midline structures are unremarkable otherwise. Exaggerated cervical lordosis is noted. Sinuses/Orbits: The paranasal sinuses and right mastoid air cells are clear. A left mastoid effusion is present. No obstructing nasopharyngeal lesion is present. The globes and orbits are within normal limits. IMPRESSION: 1. Normal MRI appearance of the brain for age. No acute or focal abnormality to explain the patient's symptoms. 2. Left mastoid effusion. No obstructing nasopharyngeal lesion is present. 3. Degenerative changes of the cervical spine adjacent level disease at C2-3 following fusion at C3-4. Electronically Signed   By: San Morelle M.D.   On: 07/17/2018 12:29   US Renal  Result Date: 07/16/2018 CLINICAL DATA:  Acute renal failure EXAM: RENAL / URINARY TRACT ULTRASOUND COMPLETE COMPARISON:  None. FINDINGS: Right Kidney: Renal measurements: 10.3 x 4.9 x 5.3 cm = volume: 139 mL. Diffuse renal parenchymal atrophy. No hydronephrosis or mass lesion identified. Left Kidney: Renal measurements: 8.7 x 4.8 x 5 cm = volume: 109  mL. Limited visualization. Lower pole is obscured by overlying bowel gas. There appears to be diffuse renal parenchymal atrophy. No mass or hydronephrosis demonstrated as visualized. Bladder: Bladder is not visualized, completely decompressed. IMPRESSION: Bilateral diffuse renal parenchymal atrophy.  No hydronephrosis. Electronically Signed   By: Lucienne Capers M.D.   On: 07/16/2018 20:19   Dg Chest Port 1 View  Result Date: 07/17/2018 CLINICAL DATA:  Crackles in the lung bases on auscultatory examination. EXAM: PORTABLE CHEST 1 VIEW COMPARISON:  07/16/2018 and earlier. FINDINGS: Cardiac  silhouette mildly to moderately enlarged for AP portable technique, unchanged. Minimal atelectasis at the RIGHT lung base, new since yesterday. Lungs otherwise clear. No confluent airspace consolidation. No visible pleural effusions, though the costophrenic sulci are excluded from the image. IMPRESSION: Minimal RIGHT basilar atelectasis. No acute cardiopulmonary disease otherwise. Stable cardiomegaly without pulmonary edema. Electronically Signed   By: Evangeline Dakin M.D.   On: 07/17/2018 21:41   Dg Chest Port 1 View  Result Date: 07/16/2018 CLINICAL DATA:  Patient found down by family. EXAM: PORTABLE CHEST 1 VIEW COMPARISON:  03/07/2017 FINDINGS: The heart size and mediastinal contours are within normal limits. Aortic atherosclerosis is noted at the arch, stable in appearance without aneurysm. Both lungs are clear. The visualized skeletal structures are unremarkable. IMPRESSION: No active disease. Electronically Signed   By: Ashley Royalty M.D.   On: 07/16/2018 14:56   Dg Knee Complete 4 Views Left  Result Date: 07/16/2018 CLINICAL DATA:  Left knee swelling. Patient found down today. EXAM: LEFT KNEE - COMPLETE 4+ VIEW COMPARISON:  03/04/2016 FINDINGS: Tricompartmental osteoarthritis of the knee is redemonstrated with moderate femorotibial and patellofemoral joint space narrowing and spurring identified. Cortical irregularity of the anterior aspect of the medial femoral condyle is seen on the lateral view which appears more likely to be chronic as it appears likely present on the previous exam though more obscured by overlap from the patient's patella. A moderate-sized suprapatellar joint effusion is identified on current exam, increased from prior without fat fluid level. IMPRESSION: 1. 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 2. Cortical irregularity of the anterior aspect of the medial femoral condyle is seen on the lateral view which appears more likely to be chronic  as it appears more obscured by overlap from the patient's patella. If there is pain out of proportion to radiographic findings however, consider cross-sectional imaging such as CT for better assessment. - Electronically Signed   By: Ashley Royalty M.D.   On: 07/16/2018 17:09    PHYSICAL EXAM  Temp:  [98.1 F (36.7 C)-98.7 F (37.1 C)] 98.1 F (36.7 C) (01/20 0514) Pulse Rate:  [97-104] 97 (01/20 0514) Resp:  [16-26] 16 (01/20 0514) BP: (119-135)/(70-72) 121/70 (01/20 0514) SpO2:  [85 %-97 %] 94 % (01/20 0514)  General - Well nourished, well developed, mildly lethargic.  Ophthalmologic - fundi not visualized due to noncooperation.  Cardiovascular - Regular rate and rhythm.  Respiratory - still has copious secretions and gurgling sound. On 2L nasal cannula.   Neuro exam - sleepy but easily arousable. Able to tell me his name but the perservarated on it with most further questions. Disorientated to age, place or time. Severe dysarthria. Not cooperative on naming or repetition. PERRL, attending to both side, EOMI bilaterally. Blinking to visual threat bilaterally. Facial symmetrical. Tongue midline in mouth. Moving BUE 3/5 but significant asterixis. Slight withdraw to pain BLEs. On soft boots bilaterally, no babinski. DTR 1+ to diminished. Sensation, coordination not cooperative and  gait not tested.   ASSESSMENT/PLAN Harold Martin is a 78 y.o. male with history of severe COPD, hypothyroidism, DM, CKD admitted for found down unresponsive at home on 07/16/2018. He was found to have severe AKI on CKD, dehydration, rhabdomyolysis, myxedema coma. He was treated with IV synthroid, IV steroids and IV hydration. Symptoms much improved initially, TSH down trending nicely and FT4 normalized. Cre now below baseline. However, pt mental status started to worsen since 07/21/18 and had delirium and drowsy and disorientation. MRI no acute finding and EEG x2 no seizure but diffuse slowing consistent with  encephalopathy. Exam showed no focal deficit but disorientated, severe dysarthria and b/l asterixis.   Encephalopathy   No focal deficit on exam but b/l asterixis and disorientation, consistent with encephalopathy  EEG no seizure but diffuse slowing consistent with encephalopathy  MRI negative for acute finding  Given copious secretions and history of severe COPD, will need to rule out respiratory related encephalopathy  Recommend ABG, CXR and 2D echo  Repeat UA  avoid benzodiazepines and narcotics  Afebrile, no meningismus, not concerning for CNS infection at this time.  Continue treatment for hypothyroidism  Continue supportive care  Respiratory distress  Usually concerning for hypercapnia, ABG did not show significant elevation CO2  Currently on nasal cannula 2 L  Given history of severe COPD and current copious secretions, will need to check ABG, CXR and 2D echo.  Will do 3% nebulization for secretions and NTS as needed  No evidence of PE at this time, will hold off further testing at this time  Hypothyroidism  TSH on admission 78  On IV steroid and Synthroid  TSH today 8.5 and free T4 normalized  Continue IV Synthroid  AKI on CKD  Likely due to dehydration  Was treated with IV fluid  Creatinine much improved  Rhabdomyolysis  CK was not 7000 on admission  AST/ALT also elevated on admission  Treated with IV fluid  AST/ALT much improved  Diabetes  HgbA1c 7.5 goal < 7.0  Uncontrolled  Currently on NPH  CBG monitoring  SSI  close PCP follow up  Hospital day # 10  I spent  35 minutes in total face-to-face time with the patient, more than 50% of which was spent in counseling and coordination of care, reviewing test results, images and medication, and discussing the diagnosis of encephalopathy of unknown etiology, treatment plan and potential prognosis. This patient's care requiresreview of multiple databases, neurological assessment,  discussion with family, other specialists and medical decision making of high complexity.   Rosalin Hawking, MD PhD Stroke Neurology 07/26/2018 10:20 AM    To contact Stroke Continuity provider, please refer to [email or web address] After hours, contact General Neurology

## 2018-07-26 NOTE — Progress Notes (Signed)
Patient ID: Harold Martin, male   DOB: 28-Oct-1940, 78 y.o.   MRN: [id number]  PROGRESS NOTE    ANDROS CHANNING  [id number]:[id number] DOB: 04/24/1941 DOA: 07/16/2018 PCP: Anda Kraft, MD   Brief Narrative:  78 year old male with history of severe COPD, hypothyroidism, insulin-dependent type 2 diabetes mellitus who lives at home with alone, still walks and ambulates [place] presented on 07/16/2018 with altered mental status when he was found down on the floor unresponsive.  TSH was extremely elevated suggesting most likely of myxedema coma. He was started on iv synthroid.  Mental status is very slow to respond.  IV liothyronine was also added.  Assessment & Plan:   Principal Problem:   Acute encephalopathy Active Problems:   EMPHYSEMA   Acute renal failure superimposed on stage 3 chronic kidney disease (HCC)   Type 2 diabetes mellitus with hyperglycemia (HCC)   Essential hypertension   COPD (chronic obstructive pulmonary disease) (HCC)   Metabolic encephalopathy   Pressure injury of skin   Acute metabolic encephalopathy most likely secondary to myxedema coma along with delirium -Continue IV Synthroid 100 mcg daily along with IV liothyronine 5 mcg 3 times a day -Monitor mental status.  Use PRN Haldol for extreme agitation and avoid benzodiazepines and narcotics -MRI of the brain was negative for acute abnormality -EEG negative for seizure activities. -Mental status is still fluctuating and not with much improvement over the last many days.  Spoke to Dr. Lindzen/neurology and requested for neurology evaluation.  Acute renal failure/hyponatremia/severe dehydration -Resolved.  Creatinine is 1.57 today.  Renal ultrasound was negative for hydronephrosis.  Foley catheter was discontinued on 07/19/2018. -Monitor creatinine.  Rhabdomyolysis -Treated with IV fluids and improved  COPD -Stable.  Use nebs as needed  Hypertension -Blood pressure stable.  Not on any  antihypertensives.  Left knee effusion -No need for arthrocentesis at this time as per orthopedic recommendations  Diabetes mellitus type 2 -Continue current insulin regimen along with CBGs and sliding scale insulin.  Blood sugars stable.  Elevated LFTs -Questionable cause.  Improving.  Outpatient follow-up   DVT prophylaxis: Subcutaneous heparin Code Status: Full Family Communication: None at bedside Disposition Plan: SNF once mental status improves Consultants: None  Procedures: None  Antimicrobials: None   Subjective: Patient seen and examined at bedside.  He is sleepy, wakes up slightly on calling his name but a very poor historian.  Speech is not that comprehensible.  No overnight agitation, fever or vomiting reported by nursing staff.   Objective: Vitals:   07/25/18 1344 07/25/18 1351 07/25/18 2009 07/26/18 0514  BP: 119/71  135/72 121/70  Pulse: (!) 104  97 97  Resp: (!) 26 20 16 16   Temp: 98.3 F (36.8 C)  98.7 F (37.1 C) 98.1 F (36.7 C)  TempSrc: Oral  Oral Oral  SpO2: (!) 85% 97% 96% 94%  Weight:      Height:        Intake/Output Summary (Last 24 hours) at 07/26/2018 1001 Last data filed at 07/26/2018 0513 Gross per 24 hour  Intake -  Output 400 ml  Net -400 ml   Filed Weights   07/16/18 1920 07/17/18 0347  Weight: 100.1 kg 101.3 kg    Examination:  General exam: No acute distress.  Sleeping, wakes up slightly, poor historian, not that well comprehensible speech. Respiratory system: Bilateral decreased breath sounds at bases, some scattered crackles.  No wheezing Cardiovascular system: S1-S2 heard, rate controlled Gastrointestinal system: Abdomen is nondistended, soft and  nontender. Normal bowel sounds heard. Extremities: No cyanosis, trace edema    Data Reviewed: I have personally reviewed following labs and imaging studies  CBC: Recent Labs  Lab 07/22/18 0503 07/23/18 0404 07/24/18 0348 07/25/18 0510 07/26/18 0401  WBC 6.6 6.6 6.9  6.8 6.4  NEUTROABS 5.1 5.1 5.3 5.3 5.0  HGB 11.5* 10.7* 10.7* 10.9* 10.6*  HCT 35.7* 34.3* 35.2* 36.0* 34.6*  MCV 100.0 102.1* 101.7* 102.6* 103.9*  PLT 143* 151 137* 141* 740   Basic Metabolic Panel: Recent Labs  Lab 07/22/18 0503 07/22/18 0926 07/23/18 0404 07/24/18 0348 07/25/18 0510 07/26/18 0401  NA  --  142 142 144 145 145  K  --  4.0 4.3 4.5 4.4 4.5  CL  --  99 100 103 102 101  CO2  --  36* 36* 32 32 33*  GLUCOSE  --  179* 143* 151* 154* 159*  BUN  --  21 23 21 21  25*  CREATININE  --  1.83* 1.82* 1.50* 1.55* 1.57*  CALCIUM  --  8.8* 8.8* 8.9 8.9 8.9  MG 2.9*  --  2.8* 2.5* 2.6* 2.6*   GFR: Estimated Creatinine Clearance: 50.9 mL/min (A) (by C-G formula based on SCr of 1.57 mg/dL (H)). Liver Function Tests: Recent Labs  Lab 07/20/18 0321 07/22/18 0926 07/23/18 0404 07/25/18 0510  AST 71* 44* 44* 49*  ALT 42 32 32 40  ALKPHOS 80 72 68 85  BILITOT 0.8 0.9 0.8 1.1  PROT 6.7 6.1* 6.0* 6.0*  ALBUMIN 3.6 3.3* 3.3* 3.2*   No results for input(s): LIPASE, AMYLASE in the last 168 hours. No results for input(s): AMMONIA in the last 168 hours. Coagulation Profile: No results for input(s): INR, PROTIME in the last 168 hours. Cardiac Enzymes: No results for input(s): CKTOTAL, CKMB, CKMBINDEX, TROPONINI in the last 168 hours. BNP (last 3 results) No results for input(s): PROBNP in the last 8760 hours. HbA1C: No results for input(s): HGBA1C in the last 72 hours. CBG: Recent Labs  Lab 07/25/18 1719 07/25/18 2007 07/26/18 0054 07/26/18 0511 07/26/18 0811  GLUCAP 139* 172* 151* 158* 171*   Lipid Profile: No results for input(s): CHOL, HDL, LDLCALC, TRIG, CHOLHDL, LDLDIRECT in the last 72 hours. Thyroid Function Tests: Recent Labs    07/26/18 0401  TSH 8.520*  FREET4 1.27   Anemia Panel: No results for input(s): VITAMINB12, FOLATE, FERRITIN, TIBC, IRON, RETICCTPCT in the last 72 hours. Sepsis Labs: No results for input(s): PROCALCITON, LATICACIDVEN in the  last 168 hours.  Recent Results (from the past 240 hour(s))  MRSA PCR Screening     Status: None   Collection Time: 07/16/18  8:03 PM  Result Value Ref Range Status   MRSA by PCR NEGATIVE NEGATIVE Final    Comment:        The GeneXpert MRSA Assay (FDA approved for NASAL specimens only), is one component of a comprehensive MRSA colonization surveillance program. It is not intended to diagnose MRSA infection nor to guide or monitor treatment for MRSA infections. Performed at [facility], [place] [address] [place], [place] [address]          Radiology Studies: No results found.      Scheduled Meds: . aspirin  81 mg Oral Daily  . chlorhexidine  15 mL Mouth Rinse BID  . heparin  5,000 Units Subcutaneous Q8H  . insulin aspart  0-15 Units Subcutaneous Q4H  . insulin NPH Human  20 Units Subcutaneous QAC breakfast  . levothyroxine  100 mcg Intravenous Q0600  . Liothyronine Sodium  5 mcg Intravenous Q8H  . mouth rinse  15 mL Mouth Rinse BID  . polyethylene glycol  17 g Oral Daily  . tamsulosin  0.4 mg Oral Q1200   Continuous Infusions:    LOS: 10 days        Aline August, MD [facility] Pager [phone]  If 7PM-7AM, please contact night-coverage [email or web address] Password [email or web address] 07/26/2018, 10:01 AM

## 2018-07-26 NOTE — Progress Notes (Signed)
CSW updated [facility] and daughter that patient is not medically stable for discharge yet per MD. Patient will require new insurance approval.   Cedric Fishman LCSW [phone]

## 2018-07-26 NOTE — Progress Notes (Signed)
  Speech Language Pathology Treatment: Dysphagia  Patient Details Name: Harold Martin MRN: [id number] DOB: 30-Dec-1940 Today's Date: 07/26/2018 Time: 1540-0867 SLP Time Calculation (min) (ACUTE ONLY): 17 min  Assessment / Plan / Recommendation Clinical Impression  Pt continues to present with moderate oropharyngeal dysphagia.  SLP provided oral care with swab and chlorhexadine wash prior to POs.  Thick white secretions removed from oral cavity.  Pt is moderately-severely dysarthric and was extremely difficult to understand.  Oriented pt to location.    Pt consumed portions of dysphagia 2 (chopped/ground) meal tray with SLP and thin and nectar thick liquids.  Pt required verbal cues to attend to POs.  There was mild-moderate residue with solid and puree textures.  Following solids with liquid wash helped to clear oral cavity.  With trials of thin liquid, pt required consistent verbal cuing to take single sip. There was mild anterior loss of thin liquid.  Pt tolerated nectar thick liquid by self-administered straw sips.  Pt appeared to fatigue over course of session and PO administration was stopped to allow pt to rest.  RN reports drowsiness during PO intake as well.  RD has seen pt during this admission.  If pt remains too lethargic for oral intake, pt may need assessment for supplemental nutrition. Recommend continuing dysphagia 2/chopped diet with nectar thick liquids.    HPI HPI: Harold Martin is a 78 y.o. male, severe COPD, hypothyroidism, insulin-dependent type 2 diabetes mellitus who lives at home alone, still walks and ambulates by himself and drives a car, who had been suffering with mild cold for the last few days, found down unresponsive. Metabolic encephalopathy likely due to early myxedema coma. Had an episode of poor secretion management on night of 1/11. Pt has a history of dysphagia. MBS on 02/26/15 reprots Patient presents with a moderate pharyngeal phase dysphagia characterized by  decreased movement of the hyolaryngeal complex and possible mild decrease in tight laryngeal closure due to presence of cervical hardware at C3-4 from previous surgery. Patient with deep silent penetration of thin liquids and moderate-severe vallecular residuals post swallow which cued dry swallows assist to decrease. Recommended to consume dys 1/nectar at that time, discharged to SNF the next day. MBSS this admission with recommendations for chopped diet with nectar thick liquid.  Pt's chest imaging has remained clear with no acute findings on most recent CXR 07/26/18      SLP Plan  Continue with current plan of care       Recommendations  Diet recommendations: Dysphagia 2 (fine chop);Nectar-thick liquid Liquids provided via: Straw Medication Administration: Whole meds with puree Supervision: Full supervision/cueing for compensatory strategies;Staff to assist with self feeding Compensations: Slow rate;Small sips/bites;Follow solids with liquid Postural Changes and/or Swallow Maneuvers: Seated upright 90 degrees(as upright as possible)                SLP Visit Diagnosis: Dysphagia, oropharyngeal phase (R13.12) Plan: Continue with current plan of care       [place], [place], [place] Office: [phone]; Pager (1/20): [phone] 07/26/2018, 2:22 PM

## 2018-07-26 NOTE — Progress Notes (Signed)
Physical Therapy Treatment Patient Details Name: Harold Martin MRN: [id number] DOB: 09/03/1940 Today's Date: 07/26/2018    History of Present Illness Pt is a 78 y/o male admitted secondary to being found down and unresponsive. Pt found to have acute encephalopathy and severe dehydration. PMH including but not limited to severe COPD, hypothyroidism, insulin-dependent type 2 diabetes mellitus.    PT Comments    Pt remains lethargic and required max VCs for sequencing to participate in session this pm.  Pt required assistance to elevate trunk into sitting and lacks ability to stand entirely at edge of bed.  Squat pivot used with bed pad for transfer to recliner.  Plan for SNF remains appropriate to improve strength and function and decrease caregiver burden.       Follow Up Recommendations  SNF     Equipment Recommendations  None recommended by PT    Recommendations for Other Services       Precautions / Restrictions Precautions Precautions: Fall Restrictions Weight Bearing Restrictions: No    Mobility  Bed Mobility Overal bed mobility: Needs Assistance Bed Mobility: Supine to Sit Rolling: Mod assist;+2 for physical assistance   Supine to sit: Max assist;+2 for physical assistance     General bed mobility comments: Pt required assistance to advance LEs to edge of bed and to elevate trunk into sitting.  Transfers Overall transfer level: Needs assistance Equipment used: 2 person hand held assist Transfers: Squat Pivot Transfers     Squat pivot transfers: Max assist;+2 physical assistance     General transfer comment: Pt with flexed posture in standing required assistance to boost enough to pivot to recliner.  Pt lacks ability to stand for trial and appears more lethargic.    Ambulation/Gait Ambulation/Gait assistance: (NT)               Stairs             Wheelchair Mobility    Modified Rankin (Stroke Patients Only)       Balance Overall  balance assessment: Needs assistance   Sitting balance-Leahy Scale: Poor Sitting balance - Comments: fluctuating between needing max A to mod A and one UE support, demonstrated LOB anterior and posterior     Standing balance-Leahy Scale: Zero Standing balance comment: external assistance to maintain standing.                              Cognition Arousal/Alertness: Lethargic;Suspect due to medications Behavior During Therapy: Restless;Impulsive Overall Cognitive Status: Impaired/Different from baseline Area of Impairment: Orientation;Memory;Following commands;Safety/judgement;Awareness;Attention;Problem solving                 Orientation Level: Disoriented to;Time;Situation;Place Current Attention Level: Sustained Memory: Decreased short-term memory Following Commands: Follows one step commands inconsistently;Follows one step commands with increased time Safety/Judgement: Decreased awareness of deficits;Decreased awareness of safety Awareness: Intellectual Problem Solving: Slow processing;Decreased initiation;Difficulty sequencing;Requires verbal cues;Requires tactile cues General Comments: Pt is HOH.  Appears to be hallucinating and reaching for objects that are not there.        Exercises      General Comments        Pertinent Vitals/Pain Pain Assessment: Faces Faces Pain Scale: Hurts a little bit Pain Location: generalized but reports his feet hurt Pain Descriptors / Indicators: Grimacing;Guarding Pain Intervention(s): Monitored during session;Repositioned    Home Living  Prior Function            PT Goals (current goals can now be found in the care plan section) Acute Rehab PT Goals Patient Stated Goal: "get in the chair." Potential to Achieve Goals: Fair Progress towards PT goals: Progressing toward goals    Frequency    Min 2X/week      PT Plan Current plan remains appropriate    Co-evaluation               AM-PAC PT "6 Clicks" Mobility   Outcome Measure  Help needed turning from your back to your side while in a flat bed without using bedrails?: A Lot Help needed moving from lying on your back to sitting on the side of a flat bed without using bedrails?: A Lot Help needed moving to and from a bed to a chair (including a wheelchair)?: Total Help needed standing up from a chair using your arms (e.g., wheelchair or bedside chair)?: Total Help needed to walk in hospital room?: Total Help needed climbing 3-5 steps with a railing? : Total 6 Click Score: 8    End of Session Equipment Utilized During Treatment: Gait belt Activity Tolerance: Patient limited by fatigue;Patient limited by lethargy Patient left: in bed;with call bell/phone within reach;with bed alarm set Nurse Communication: Mobility status PT Visit Diagnosis: Other abnormalities of gait and mobility (R26.89);Muscle weakness (generalized) (M62.81)     Time: 8335-8251 PT Time Calculation (min) (ACUTE ONLY): 17 min  Charges:  $Therapeutic Activity: 8-22 mins                     Governor Rooks, PTA Acute Rehabilitation Services Pager [phone] Office [phone]     Carlea Badour Eli Hose 07/26/2018, 3:48 PM

## 2018-07-27 LAB — COMPREHENSIVE METABOLIC PANEL
ALT: 42 U/L (ref 0–44)
AST: 43 U/L — AB (ref 15–41)
Albumin: 3.2 g/dL — ABNORMAL LOW (ref 3.5–5.0)
Alkaline Phosphatase: 98 U/L (ref 38–126)
Anion gap: 6 (ref 5–15)
BUN: 32 mg/dL — ABNORMAL HIGH (ref 8–23)
CO2: 34 mmol/L — ABNORMAL HIGH (ref 22–32)
CREATININE: 1.66 mg/dL — AB (ref 0.61–1.24)
Calcium: 9.1 mg/dL (ref 8.9–10.3)
Chloride: 106 mmol/L (ref 98–111)
GFR calc Af Amer: 46 mL/min — ABNORMAL LOW (ref >60)
GFR calc non Af Amer: 40 mL/min — ABNORMAL LOW (ref >60)
GLUCOSE: 150 mg/dL — AB (ref 70–99)
Potassium: 4.5 mmol/L (ref 3.5–5.1)
Sodium: 146 mmol/L — ABNORMAL HIGH (ref 135–145)
Total Bilirubin: 0.7 mg/dL (ref 0.3–1.2)
Total Protein: 6.2 g/dL — ABNORMAL LOW (ref 6.5–8.1)

## 2018-07-27 LAB — GLUCOSE, CAPILLARY
GLUCOSE-CAPILLARY: 136 mg/dL — AB (ref 70–99)
Glucose-Capillary: 126 mg/dL — ABNORMAL HIGH (ref 70–99)
Glucose-Capillary: 154 mg/dL — ABNORMAL HIGH (ref 70–99)
Glucose-Capillary: 157 mg/dL — ABNORMAL HIGH (ref 70–99)
Glucose-Capillary: 167 mg/dL — ABNORMAL HIGH (ref 70–99)
Glucose-Capillary: 178 mg/dL — ABNORMAL HIGH (ref 70–99)
Glucose-Capillary: 221 mg/dL — ABNORMAL HIGH (ref 70–99)

## 2018-07-27 LAB — CBC WITH DIFFERENTIAL/PLATELET
Abs Immature Granulocytes: 0.08 10*3/uL — ABNORMAL HIGH (ref 0.00–0.07)
Basophils Absolute: 0 10*3/uL (ref 0.0–0.1)
Basophils Relative: 0 %
Eosinophils Absolute: 0.2 10*3/uL (ref 0.0–0.5)
Eosinophils Relative: 2 %
HCT: 35.6 % — ABNORMAL LOW (ref 39.0–52.0)
Hemoglobin: 10.8 g/dL — ABNORMAL LOW (ref 13.0–17.0)
Immature Granulocytes: 1 %
LYMPHS ABS: 0.4 10*3/uL — AB (ref 0.7–4.0)
Lymphocytes Relative: 6 %
MCH: 31.3 pg (ref 26.0–34.0)
MCHC: 30.3 g/dL (ref 30.0–36.0)
MCV: 103.2 fL — ABNORMAL HIGH (ref 80.0–100.0)
MONOS PCT: 13 %
Monocytes Absolute: 0.9 10*3/uL (ref 0.1–1.0)
Neutro Abs: 5.5 10*3/uL (ref 1.7–7.7)
Neutrophils Relative %: 78 %
Platelets: 145 10*3/uL — ABNORMAL LOW (ref 150–400)
RBC: 3.45 MIL/uL — ABNORMAL LOW (ref 4.22–5.81)
RDW: 13.5 % (ref 11.5–15.5)
WBC: 7 10*3/uL (ref 4.0–10.5)
nRBC: 0 % (ref 0.0–0.2)

## 2018-07-27 LAB — SEDIMENTATION RATE: Sed Rate: 80 mm/hr — ABNORMAL HIGH (ref 0–16)

## 2018-07-27 LAB — CK: Total CK: 374 U/L (ref 49–397)

## 2018-07-27 LAB — MAGNESIUM: Magnesium: 2.8 mg/dL — ABNORMAL HIGH (ref 1.7–2.4)

## 2018-07-27 LAB — RPR: RPR Ser Ql: NONREACTIVE

## 2018-07-27 LAB — T3, FREE: T3, Free: 2.9 pg/mL (ref 2.0–4.4)

## 2018-07-27 LAB — AMMONIA: Ammonia: 22 umol/L (ref 9–35)

## 2018-07-27 MED ORDER — LEVOTHYROXINE SODIUM 100 MCG PO TABS
200.0000 ug | ORAL_TABLET | Freq: Every day | ORAL | Status: DC
  Administered 2018-07-28 – 2018-07-29 (×2): 200 ug via ORAL
  Filled 2018-07-27 (×2): qty 2

## 2018-07-27 NOTE — Progress Notes (Signed)
Patient ID: Harold Martin, male   DOB: [birth year], 78 y.o.   MRN: [id number]  PROGRESS NOTE    MERWYN HODAPP  MVV:[id number] DOB: [birth year] DOA: 07/16/2018 PCP: Anda Kraft, MD   Brief Narrative:  78 year old male with history of severe COPD, hypothyroidism, insulin-dependent type 2 diabetes mellitus who lives at home with alone, still walks and ambulates [place] presented on 07/16/2018 with altered mental status when he was found down on the floor unresponsive.  TSH was extremely elevated suggesting most likely of myxedema coma. He was started on iv synthroid.  Mental status is very slow to respond.  IV liothyronine was also added.  Neurology was also consulted because of not much improvement in mental status.  Assessment & Plan:   Principal Problem:   Acute encephalopathy Active Problems:   EMPHYSEMA   Acute renal failure superimposed on stage 3 chronic kidney disease (HCC)   Type 2 diabetes mellitus with hyperglycemia (HCC)   Essential hypertension   COPD (chronic obstructive pulmonary disease) (HCC)   Metabolic encephalopathy   Pressure injury of skin   Acute metabolic encephalopathy most likely secondary to myxedema coma along with delirium -Continue IV Synthroid 100 mcg daily.  Liothyronine was changed to oral on 07/26/2018.  TSH level has much improved as of 07/25/2018 -Monitor mental status.  Use PRN Haldol for extreme agitation and avoid benzodiazepines and narcotics -MRI of the brain was negative for acute abnormality.  Vitamin B12 and thiamine levels were normal -EEG negative for seizure activities. -Mental status has not shown much improvement.  She would be intermittently awake and has currently garbled speech.  Neurology was consulted on 07/26/2018.  Will follow recommendations.    Acute renal failure/hyponatremia/severe dehydration -Resolved.  Creatinine is 1.66 today.  Renal ultrasound was negative for hydronephrosis.  Foley catheter was discontinued on  07/19/2018. -Monitor creatinine.  Rhabdomyolysis -Treated with IV fluids and improved  COPD -Stable.  Use nebs as needed  Hypertension -Blood pressure stable.  Not on any antihypertensives.  Left knee effusion -No need for arthrocentesis at this time as per orthopedic recommendations  Diabetes mellitus type 2 -Continue current insulin regimen along with CBGs and sliding scale insulin.  Blood sugars stable.  Elevated LFTs -Questionable cause.  Improving.  Outpatient follow-up   DVT prophylaxis: Subcutaneous heparin Code Status: Full Family Communication: None at bedside Disposition Plan: SNF once mental status improves Consultants: None  Procedures: None  Antimicrobials: None   Subjective: Patient seen and examined at bedside.  He is more awake this morning but has incomprehensible speech.  No overnight agitation reported by nursing staff.  No overnight fever or vomiting.   Objective: Vitals:   07/26/18 2335 07/27/18 0042 07/27/18 0617 07/27/18 0826  BP: 115/71  114/71   Pulse: (!) 109 92 99   Resp: 16  18   Temp: 98.7 F (37.1 C)  98.3 F (36.8 C)   TempSrc: Oral  Oral   SpO2: 94%  95% 94%  Weight:      Height:        Intake/Output Summary (Last 24 hours) at 07/27/2018 0924 Last data filed at 07/27/2018 0900 Gross per 24 hour  Intake 118 ml  Output -  Net 118 ml   Filed Weights   07/16/18 1920 07/17/18 0347  Weight: 100.1 kg 101.3 kg    Examination:  General exam: No distress.  More awake.  Has incomprehensible speech.   Respiratory system: Bilateral decreased breath sounds at bases, some scattered crackles.  Cardiovascular system: Rate controlled, S1-S2 heard Gastrointestinal system: Abdomen is nondistended, soft and nontender. Normal bowel sounds heard. Extremities: No cyanosis, trace edema    Data Reviewed: I have personally reviewed following labs and imaging studies  CBC: Recent Labs  Lab 07/23/18 0404 07/24/18 0348 07/25/18 0510  07/26/18 0401 07/27/18 0413  WBC 6.6 6.9 6.8 6.4 7.0  NEUTROABS 5.1 5.3 5.3 5.0 5.5  HGB 10.7* 10.7* 10.9* 10.6* 10.8*  HCT 34.3* 35.2* 36.0* 34.6* 35.6*  MCV 102.1* 101.7* 102.6* 103.9* 103.2*  PLT 151 137* 141* 154 756*   Basic Metabolic Panel: Recent Labs  Lab 07/23/18 0404 07/24/18 0348 07/25/18 0510 07/26/18 0401 07/27/18 0413  NA 142 144 145 145 146*  K 4.3 4.5 4.4 4.5 4.5  CL 100 103 102 101 106  CO2 36* 32 32 33* 34*  GLUCOSE 143* 151* 154* 159* 150*  BUN 23 21 21  25* 32*  CREATININE 1.82* 1.50* 1.55* 1.57* 1.66*  CALCIUM 8.8* 8.9 8.9 8.9 9.1  MG 2.8* 2.5* 2.6* 2.6* 2.8*   GFR: Estimated Creatinine Clearance: 48.1 mL/min (A) (by C-G formula based on SCr of 1.66 mg/dL (H)). Liver Function Tests: Recent Labs  Lab 07/22/18 0926 07/23/18 0404 07/25/18 0510 07/27/18 0413  AST 44* 44* 49* 43*  ALT 32 32 40 42  ALKPHOS 72 68 85 98  BILITOT 0.9 0.8 1.1 0.7  PROT 6.1* 6.0* 6.0* 6.2*  ALBUMIN 3.3* 3.3* 3.2* 3.2*   No results for input(s): LIPASE, AMYLASE in the last 168 hours. Recent Labs  Lab 07/27/18 0413  AMMONIA 22   Coagulation Profile: No results for input(s): INR, PROTIME in the last 168 hours. Cardiac Enzymes: Recent Labs  Lab 07/27/18 0413  CKTOTAL 374   BNP (last 3 results) No results for input(s): PROBNP in the last 8760 hours. HbA1C: No results for input(s): HGBA1C in the last 72 hours. CBG: Recent Labs  Lab 07/26/18 1702 07/26/18 2033 07/27/18 0027 07/27/18 0412 07/27/18 0813  GLUCAP 145* 129* 154* 136* 126*   Lipid Profile: No results for input(s): CHOL, HDL, LDLCALC, TRIG, CHOLHDL, LDLDIRECT in the last 72 hours. Thyroid Function Tests: Recent Labs    07/26/18 0401  TSH 8.520*  FREET4 1.27  T3FREE 2.9   Anemia Panel: No results for input(s): VITAMINB12, FOLATE, FERRITIN, TIBC, IRON, RETICCTPCT in the last 72 hours. Sepsis Labs: No results for input(s): PROCALCITON, LATICACIDVEN in the last 168 hours.  No results found  for this or any previous visit (from the past 240 hour(s)).       Radiology Studies: Dg Chest Port 1 View  Result Date: 07/26/2018 CLINICAL DATA:  Dyspnea. EXAM: PORTABLE CHEST 1 VIEW COMPARISON:  07/19/2018 FINDINGS: The Chin overlies the apices. Normal heart size for level of inspiration. Atherosclerosis in the transverse aorta. Mild right hemidiaphragm elevation. No pleural effusion or pneumothorax. Mild subsegmental atelectasis at the right lung base. IMPRESSION: No acute cardiopulmonary disease. Aortic Atherosclerosis (ICD10-I70.0). Electronically Signed   By: Abigail Miyamoto M.D.   On: 07/26/2018 13:20        Scheduled Meds: . aspirin  81 mg Oral Daily  . chlorhexidine  15 mL Mouth Rinse BID  . heparin  5,000 Units Subcutaneous Q8H  . insulin aspart  0-15 Units Subcutaneous Q4H  . insulin NPH Human  20 Units Subcutaneous QAC breakfast  . levothyroxine  100 mcg Intravenous Q0600  . liothyronine  5 mcg Oral Q8H  . mouth rinse  15 mL Mouth Rinse BID  . polyethylene glycol  17 g Oral Daily  . sodium chloride HYPERTONIC  4 mL Nebulization Q6H WA  . tamsulosin  0.4 mg Oral Q1200   Continuous Infusions:    LOS: 11 days        Aline August, MD [facility] Pager [phone]  If 7PM-7AM, please contact night-coverage [email or web address] Password [facility] 07/27/2018, 9:24 AM

## 2018-07-27 NOTE — Progress Notes (Signed)
Nutrition Follow-up  INTERVENTION:   - Recommend obtaining new weight as last recorded weight is from 07/17/18  - Continue Vital Cuisine shakes BID, each supplement provides 520 kcal and 22 grams of protein  - Continue meal assistance with all meals  NUTRITION DIAGNOSIS:   Inadequate oral intake related to lethargy/confusion as evidenced by meal completion < 50%.  Ongoing  GOAL:   Patient will meet greater than or equal to 90% of their needs  Progressing, PO intake 5-50%  MONITOR:   PO intake, Supplement acceptance, Labs, Weight trends  REASON FOR ASSESSMENT:   Low [place]    ASSESSMENT:   78 y.o male severe COPD, hypothyroidism and T2DM. Admitted with severe dehydration,  AMS and metabolic encephalopathy likely due to early myxedema coma.   Neurology following. Encephalopathy of unknown ideology.  Discussed pt with RN who reports pt ate some of breakfast and had some applesauce. RN did not see pt's lunch tray. Per flowsheets, 15% of lunch meal tray completed.  Attempted to speak with pt at bedside. Pt did not awake to RD touch or voice other than to grunt when RD asked questions.  NFPE completed. Pt does not meet criteria for malnutrition at this time. However, pt is at risk for malnutrition if poor PO intake persists.  No new weight since 07/17/18.  Meal Completion: 5-50% x last 8 recorded meals  Medications reviewed and include: SSI q 4 hours, Humulin 20 units daily, Miralax  Labs reviewed: sodium 146 (H), BUN 32 (H), creatinine 1.66 (H), magnesium 2.8 (H) CBG's: 157, 126, 136, 154, 129, 145 x 24 hours  NUTRITION - FOCUSED PHYSICAL EXAM:    Most Recent Value  Orbital Region  Mild depletion  Upper Arm Region  No depletion  Thoracic and Lumbar Region  No depletion  Buccal Region  No depletion  Temple Region  Mild depletion  Clavicle Bone Region  Mild depletion  Clavicle and Acromion Bone Region  Mild depletion  Scapular Bone Region  Unable to assess   Dorsal Hand  No depletion  Patellar Region  No depletion  Anterior Thigh Region  Mild depletion  Posterior Calf Region  Mild depletion  Edema (RD Assessment)  Mild [generalized]  Hair  Reviewed  Eyes  Unable to assess  Mouth  Unable to assess  Skin  Reviewed  Nails  Reviewed     Suspect muscle depletions in BLE related to inactivity vs malnutrition.   Diet Order:   Diet Order            DIET DYS 2 Room service appropriate? Yes; Fluid consistency: Nectar Thick  Diet effective now              EDUCATION NEEDS:   Not appropriate for education at this time  Skin:  Skin Assessment: Skin Integrity Issues: Stage I: bilateral heels Stage II: vertebral column Stage III: sacrum (vs DTI)  Last BM:  PTA/unknown  Height:   Ht Readings from Last 1 Encounters:  07/16/18 6\' 1"  (1.854 m)    Weight:   Wt Readings from Last 1 Encounters:  07/17/18 101.3 kg    Ideal Body Weight:  83.6 kg  BMI:  Body mass index is 29.46 kg/m.  Estimated Nutritional Needs:   Kcal:  2000-2200 kcal/d  Protein:  100-110 g/d  Fluid:  2.0 L    Gaynell Face, MS, RD, LDN Inpatient Clinical Dietitian Pager: [phone] Weekend/After Hours: [phone]

## 2018-07-28 LAB — BASIC METABOLIC PANEL
Anion gap: 9 (ref 5–15)
BUN: 35 mg/dL — ABNORMAL HIGH (ref 8–23)
CO2: 31 mmol/L (ref 22–32)
CREATININE: 1.63 mg/dL — AB (ref 0.61–1.24)
Calcium: 9.2 mg/dL (ref 8.9–10.3)
Chloride: 106 mmol/L (ref 98–111)
GFR calc Af Amer: 47 mL/min — ABNORMAL LOW (ref >60)
GFR calc non Af Amer: 41 mL/min — ABNORMAL LOW (ref >60)
Glucose, Bld: 146 mg/dL — ABNORMAL HIGH (ref 70–99)
Potassium: 4.4 mmol/L (ref 3.5–5.1)
Sodium: 146 mmol/L — ABNORMAL HIGH (ref 135–145)

## 2018-07-28 LAB — URINE CULTURE: Culture: >=100000 — AB

## 2018-07-28 LAB — CBC WITH DIFFERENTIAL/PLATELET
Abs Immature Granulocytes: 0.08 10*3/uL — ABNORMAL HIGH (ref 0.00–0.07)
Basophils Absolute: 0 10*3/uL (ref 0.0–0.1)
Basophils Relative: 1 %
Eosinophils Absolute: 0.2 10*3/uL (ref 0.0–0.5)
Eosinophils Relative: 3 %
HCT: 35.1 % — ABNORMAL LOW (ref 39.0–52.0)
Hemoglobin: 10.6 g/dL — ABNORMAL LOW (ref 13.0–17.0)
Immature Granulocytes: 1 %
Lymphocytes Relative: 10 %
Lymphs Abs: 0.6 10*3/uL — ABNORMAL LOW (ref 0.7–4.0)
MCH: 31.4 pg (ref 26.0–34.0)
MCHC: 30.2 g/dL (ref 30.0–36.0)
MCV: 103.8 fL — ABNORMAL HIGH (ref 80.0–100.0)
Monocytes Absolute: 0.7 10*3/uL (ref 0.1–1.0)
Monocytes Relative: 10 %
Neutro Abs: 4.8 10*3/uL (ref 1.7–7.7)
Neutrophils Relative %: 75 %
PLATELETS: 150 10*3/uL (ref 150–400)
RBC: 3.38 MIL/uL — AB (ref 4.22–5.81)
RDW: 13.7 % (ref 11.5–15.5)
WBC: 6.3 10*3/uL (ref 4.0–10.5)
nRBC: 0 % (ref 0.0–0.2)

## 2018-07-28 LAB — GLUCOSE, CAPILLARY
GLUCOSE-CAPILLARY: 182 mg/dL — AB (ref 70–99)
GLUCOSE-CAPILLARY: 164 mg/dL — AB (ref 70–99)
GLUCOSE-CAPILLARY: 146 mg/dL — AB (ref 70–99)
Glucose-Capillary: 145 mg/dL — ABNORMAL HIGH (ref 70–99)
Glucose-Capillary: 168 mg/dL — ABNORMAL HIGH (ref 70–99)

## 2018-07-28 LAB — MAGNESIUM: Magnesium: 2.6 mg/dL — ABNORMAL HIGH (ref 1.7–2.4)

## 2018-07-28 MED ORDER — ALBUTEROL SULFATE (2.5 MG/3ML) 0.083% IN NEBU: 2.5000 mg | INHALATION_SOLUTION | RESPIRATORY_TRACT | Status: DC | PRN

## 2018-07-28 MED ORDER — ARFORMOTEROL TARTRATE 15 MCG/2ML IN NEBU
15.0000 ug | INHALATION_SOLUTION | Freq: Two times a day (BID) | RESPIRATORY_TRACT | Status: DC
  Administered 2018-07-28 – 2018-07-29 (×2): 15 ug via RESPIRATORY_TRACT
  Filled 2018-07-28 (×2): qty 2

## 2018-07-28 MED ORDER — BUDESONIDE 0.25 MG/2ML IN SUSP
0.2500 mg | Freq: Two times a day (BID) | RESPIRATORY_TRACT | Status: DC
  Administered 2018-07-28 – 2018-07-29 (×2): 0.25 mg via RESPIRATORY_TRACT
  Filled 2018-07-28 (×2): qty 2

## 2018-07-28 NOTE — Progress Notes (Signed)
PROGRESS NOTE        PATIENT DETAILS Name: Harold Martin Age: 78 y.o. Sex: male Date of Birth: 03/02/1941 Admit Date: 07/16/2018 Admitting Physician Thurnell Lose, MD [id number], Thayer Jew, MD  Brief Narrative: Patient is a 78 y.o. male with history of COPD, hypothyroidism, insulin-dependent DM-2-presented to the hospital for evaluation of altered mental status, his TSH was significantly elevated-he was thought to have myxedema-and started on IV Synthroid and steroids with improvement.  Hospital course now complicated by waxing and waning delirium.  See below for further details  Subjective: Awake-alert-following commands-speech hard to understand but suspect this may be his usual garbled speech at baseline (?  Wears dentures)-no family at bedside.  Assessment/Plan: Acute metabolic encephalopathy: High suspicion for myxedema as the etiology-improved with IV Synthroid, steroids.  Hospital course complicated by waxing and waning confusion-likely delirium-he has had prior episodes of delirium during his prior hospitalizations as well.  Extensive work-up including MRI brain, EEG were negative.  This morning-awake-alert-although slow-following all my commands.  Severe hypothyroidism with myxedema: Much improved after IV levothyroxine-no longer on steroids-has been transition to oral levothyroxine.  Will stop Cytomel.  Will need repeat TSH in 3 months-not sure if patient was compliant to medications in the outpatient setting.  AKI on CKD stage III: AKI hemodynamically mediated-likely secondary to dehydration and poor oral intake-resolved with supportive care.  Hyponatremia: Likely secondary to dehydration-resolved.  Rhabdomyolysis: Treated with IV fluids-secondary to being down on admission-resolved with supportive care and IV fluids.  COPD: Some coarse wheezing scattered all over-start Brovana, budesonide and as needed albuterol nebulizer.  Hypertension: Blood  pressure stable-not on any antihypertensives  Insulin-dependent-DM-2: CBGs currently stable-continue NPH 20 units daily, and SSI.  BPH: Continue Flomax.  Deconditioning/debility: Appears to be significantly more debilitated than usual baseline-this is likely secondary to acute illness-we will need SNF on discharge.  DVT Prophylaxis: Prophylactic Heparin   Code Status: Full code   Family Communication: None at bedside-attempted to reach patient's daughter over the phone-number appears to be inaccurate.  Disposition Plan: Remain inpatient- SNF on discharge-hopefully in the next day or so.  Antimicrobial agents: Anti-infectives (From admission, onward)   Start     Dose/Rate Route Frequency Ordered Stop   07/16/18 1700  levofloxacin (LEVAQUIN) IVPB 500 mg     500 mg 100 mL/hr over 60 Minutes Intravenous  Once 07/16/18 1656 07/16/18 2205      Procedures: None  CONSULTS:  neurology  Time spent: 25- minutes-Greater than 50% of this time was spent in counseling, explanation of diagnosis, planning of further management, and coordination of care.  MEDICATIONS: Scheduled Meds: . arformoterol  15 mcg Nebulization BID  . aspirin  81 mg Oral Daily  . budesonide (PULMICORT) nebulizer solution  0.25 mg Nebulization BID  . chlorhexidine  15 mL Mouth Rinse BID  . heparin  5,000 Units Subcutaneous Q8H  . insulin aspart  0-15 Units Subcutaneous Q4H  . insulin NPH Human  20 Units Subcutaneous QAC breakfast  . levothyroxine  200 mcg Oral Q0600  . mouth rinse  15 mL Mouth Rinse BID  . polyethylene glycol  17 g Oral Daily  . sodium chloride HYPERTONIC  4 mL Nebulization Q6H WA  . tamsulosin  0.4 mg Oral Q1200   Continuous Infusions: PRN Meds:.acetaminophen **OR** [DISCONTINUED] acetaminophen, albuterol, bisacodyl, haloperidol lactate, hydrALAZINE, RESOURCE THICKENUP CLEAR   PHYSICAL  EXAM: Vital signs: Vitals:   07/27/18 2111 07/28/18 0252 07/28/18 0413 07/28/18 0900  BP: 126/77   130/80   Pulse: (!) 108  (!) 103   Resp: 16  16   Temp: 98.3 F (36.8 C)  98.8 F (37.1 C)   TempSrc: Oral  Oral   SpO2: 92% 98% 95% 90%  Weight:      Height:       Filed Weights   07/16/18 1920 07/17/18 0347  Weight: 100.1 kg 101.3 kg   Body mass index is 29.46 kg/m.   General appearance :Awake, alert, not in any distress.  Although appears slow to respond at times. Eyes:.Pink conjunctiva HEENT: Atraumatic and Normocephalic Neck: supple Resp:Good air entry bilaterally, some scattered rhonchi CVS: S1 S2 regular, no murmurs.  GI: Bowel sounds present, Non tender and not distended with no gaurding, rigidity or rebound.No organomegaly Extremities: B/L Lower Ext shows no edema, both legs are warm to touch Neurology: Generalized weakness-but seems to move all 4 extremities on command. Musculoskeletal:No digital cyanosis Skin:No Rash, warm and dry  I have personally reviewed following labs and imaging studies  LABORATORY DATA: CBC: Recent Labs  Lab 07/24/18 0348 07/25/18 0510 07/26/18 0401 07/27/18 0413 07/28/18 0324  WBC 6.9 6.8 6.4 7.0 6.3  NEUTROABS 5.3 5.3 5.0 5.5 4.8  HGB 10.7* 10.9* 10.6* 10.8* 10.6*  HCT 35.2* 36.0* 34.6* 35.6* 35.1*  MCV 101.7* 102.6* 103.9* 103.2* 103.8*  PLT 137* 141* 154 145* 151    Basic Metabolic Panel: Recent Labs  Lab 07/24/18 0348 07/25/18 0510 07/26/18 0401 07/27/18 0413 07/28/18 0324  NA 144 145 145 146* 146*  K 4.5 4.4 4.5 4.5 4.4  CL 103 102 101 106 106  CO2 32 32 33* 34* 31  GLUCOSE 151* 154* 159* 150* 146*  BUN 21 21 25* 32* 35*  CREATININE 1.50* 1.55* 1.57* 1.66* 1.63*  CALCIUM 8.9 8.9 8.9 9.1 9.2  MG 2.5* 2.6* 2.6* 2.8* 2.6*    GFR: Estimated Creatinine Clearance: 49 mL/min (A) (by C-G formula based on SCr of 1.63 mg/dL (H)).  Liver Function Tests: Recent Labs  Lab 07/22/18 0926 07/23/18 0404 07/25/18 0510 07/27/18 0413  AST 44* 44* 49* 43*  ALT 32 32 40 42  ALKPHOS 72 68 85 98  BILITOT 0.9 0.8 1.1 0.7   PROT 6.1* 6.0* 6.0* 6.2*  ALBUMIN 3.3* 3.3* 3.2* 3.2*   No results for input(s): LIPASE, AMYLASE in the last 168 hours. Recent Labs  Lab 07/27/18 0413  AMMONIA 22    Coagulation Profile: No results for input(s): INR, PROTIME in the last 168 hours.  Cardiac Enzymes: Recent Labs  Lab 07/27/18 0413  CKTOTAL 374    BNP (last 3 results) No results for input(s): PROBNP in the last 8760 hours.  HbA1C: No results for input(s): HGBA1C in the last 72 hours.  CBG: Recent Labs  Lab 07/27/18 1648 07/27/18 2109 07/27/18 2339 07/28/18 0410 07/28/18 0750  GLUCAP 167* 221* 178* 145* 168*    Lipid Profile: No results for input(s): CHOL, HDL, LDLCALC, TRIG, CHOLHDL, LDLDIRECT in the last 72 hours.  Thyroid Function Tests: Recent Labs    07/26/18 0401  TSH 8.520*  FREET4 1.27  T3FREE 2.9    Anemia Panel: No results for input(s): VITAMINB12, FOLATE, FERRITIN, TIBC, IRON, RETICCTPCT in the last 72 hours.  Urine analysis:    Component Value Date/Time   COLORURINE YELLOW 07/26/2018 1201   APPEARANCEUR CLEAR 07/26/2018 1201   LABSPEC 1.025 07/26/2018 1201  PHURINE 5.0 07/26/2018 1201   GLUCOSEU NEGATIVE 07/26/2018 [place] 07/26/2018 [place] 07/26/2018 [place] 07/26/2018 1201   PROTEINUR NEGATIVE 07/26/2018 1201   UROBILINOGEN 0.2 02/24/2015 2333   NITRITE NEGATIVE 07/26/2018 1201   [place] 07/26/2018 1201    Sepsis Labs: Lactic Acid, Venous    Component Value Date/Time   LATICACIDVEN 1.90 07/16/2018 1458    MICROBIOLOGY: Recent Results (from the past 240 hour(s))  Urine Culture     Status: Abnormal   Collection Time: 07/26/18 12:01 PM  Result Value Ref Range Status   Specimen Description URINE, RANDOM  Final   Special Requests   Final    NONE Performed at [facility], [place] [address], [place], [place] [address]    Culture >=100,000 COLONIES/mL ENTEROCOCCUS FAECALIS (A)  Final    Report Status 07/28/2018 FINAL  Final   Organism ID, Bacteria ENTEROCOCCUS FAECALIS (A)  Final      Susceptibility   Enterococcus faecalis - MIC*    AMPICILLIN <=2 SENSITIVE Sensitive     LEVOFLOXACIN >=8 RESISTANT Resistant     NITROFURANTOIN <=16 SENSITIVE Sensitive     VANCOMYCIN 1 SENSITIVE Sensitive     * >=100,000 COLONIES/mL ENTEROCOCCUS FAECALIS    RADIOLOGY STUDIES/RESULTS: Dg Chest 2 View  Result Date: 07/19/2018 CLINICAL DATA:  Cough, question aspiration pneumonia or pneumonia EXAM: CHEST - 2 VIEW COMPARISON:  07/17/2018 FINDINGS: Upper normal heart size. Mediastinal contours and pulmonary vascularity normal. Atherosclerotic calcification aorta. Small amount of barium at and presumed barium tablet are identified within thoracic esophagus. Lungs clear. No pleural effusion or pneumothorax. No acute osseous findings. IMPRESSION: Retained barium and presumed barium tablet within thoracic esophagus. No acute pulmonary abnormalities. Electronically Signed   By: Lavonia Dana M.D.   On: 07/19/2018 13:47   Ct Head Wo Contrast  Result Date: 07/16/2018 CLINICAL DATA:  78 year old male with history of trauma. Altered mental status. Found down. EXAM: CT HEAD WITHOUT CONTRAST CT CERVICAL SPINE WITHOUT CONTRAST TECHNIQUE: Multidetector CT imaging of the head and cervical spine was performed following the standard protocol without intravenous contrast. Multiplanar CT image reconstructions of the cervical spine were also generated. COMPARISON:  Head CT 03/07/2017.  Cervical spine CT 03/03/2016. FINDINGS: CT HEAD FINDINGS Brain: Mild cerebral atrophy. Patchy and confluent areas of decreased attenuation are noted throughout the deep and periventricular white matter of the cerebral hemispheres bilaterally, compatible with chronic microvascular ischemic disease. No evidence of acute infarction, hemorrhage, hydrocephalus, extra-axial collection or mass lesion/mass effect. Vascular: No hyperdense vessel or  unexpected calcification. Skull: Normal. Negative for fracture or focal lesion. Sinuses/Orbits: Small mucosal retention cyst or polyp in the anterior aspect of the left maxillary sinus incidentally noted. No acute finding. Other: None. CT CERVICAL SPINE FINDINGS Alignment: Exaggerated lordosis at C2-C3, likely positional. Alignment is otherwise anatomic. Skull base and vertebrae: Status post ACDF at C3-C4 with complete bony fusion at this level. No acute fracture. No primary bone lesion or focal pathologic process. Soft tissues and spinal canal: No prevertebral fluid or swelling. No visible canal hematoma. Disc levels: Multilevel degenerative disc disease, most severe at C6-C7 and T1-T2. Moderate multilevel facet arthropathy. Upper chest: Emphysema. Other: None. IMPRESSION: 1. No evidence of significant acute traumatic injury to the skull, brain or cervical spine. 2. Mild cerebral atrophy with chronic microvascular ischemic changes in the cerebral white matter. 3. Multilevel degenerative disc disease and cervical spondylosis, with postoperative changes of ACDF at C3-C4, as  above. Electronically Signed   By: Vinnie Langton M.D.   On: 07/16/2018 16:24   Ct Cervical Spine Wo Contrast  Result Date: 07/16/2018 CLINICAL DATA:  78 year old male with history of trauma. Altered mental status. Found down. EXAM: CT HEAD WITHOUT CONTRAST CT CERVICAL SPINE WITHOUT CONTRAST TECHNIQUE: Multidetector CT imaging of the head and cervical spine was performed following the standard protocol without intravenous contrast. Multiplanar CT image reconstructions of the cervical spine were also generated. COMPARISON:  Head CT 03/07/2017.  Cervical spine CT 03/03/2016. FINDINGS: CT HEAD FINDINGS Brain: Mild cerebral atrophy. Patchy and confluent areas of decreased attenuation are noted throughout the deep and periventricular white matter of the cerebral hemispheres bilaterally, compatible with chronic microvascular ischemic disease. No  evidence of acute infarction, hemorrhage, hydrocephalus, extra-axial collection or mass lesion/mass effect. Vascular: No hyperdense vessel or unexpected calcification. Skull: Normal. Negative for fracture or focal lesion. Sinuses/Orbits: Small mucosal retention cyst or polyp in the anterior aspect of the left maxillary sinus incidentally noted. No acute finding. Other: None. CT CERVICAL SPINE FINDINGS Alignment: Exaggerated lordosis at C2-C3, likely positional. Alignment is otherwise anatomic. Skull base and vertebrae: Status post ACDF at C3-C4 with complete bony fusion at this level. No acute fracture. No primary bone lesion or focal pathologic process. Soft tissues and spinal canal: No prevertebral fluid or swelling. No visible canal hematoma. Disc levels: Multilevel degenerative disc disease, most severe at C6-C7 and T1-T2. Moderate multilevel facet arthropathy. Upper chest: Emphysema. Other: None. IMPRESSION: 1. No evidence of significant acute traumatic injury to the skull, brain or cervical spine. 2. Mild cerebral atrophy with chronic microvascular ischemic changes in the cerebral white matter. 3. Multilevel degenerative disc disease and cervical spondylosis, with postoperative changes of ACDF at C3-C4, as above. Electronically Signed   By: Vinnie Langton M.D.   On: 07/16/2018 16:24   Mr Brain Wo Contrast  Result Date: 07/17/2018 CLINICAL DATA:  Altered level of consciousness, unexplained. Encephalopathy. EXAM: MRI HEAD WITHOUT CONTRAST TECHNIQUE: Multiplanar, multiecho pulse sequences of the brain and surrounding structures were obtained without intravenous contrast. COMPARISON:  CT head without contrast 07/16/2018 FINDINGS: Brain: Study is mildly degraded by patient motion. Mild generalized atrophy and white matter disease is within normal limits for age. No acute infarct, hemorrhage, or mass lesion is present. The ventricles are of proportionate to the degree of atrophy. No significant extraaxial  fluid collection is present. The internal auditory canals are within normal limits. The brainstem and cerebellum are within normal limits. Vascular: Flow is present in the major intracranial arteries. Skull and upper cervical spine: Cervical fusion is noted at C3-4. There is slight retrolisthesis and disc disease at C2-3. The craniocervical junction is normal. Midline structures are unremarkable otherwise. Exaggerated cervical lordosis is noted. Sinuses/Orbits: The paranasal sinuses and right mastoid air cells are clear. A left mastoid effusion is present. No obstructing nasopharyngeal lesion is present. The globes and orbits are within normal limits. IMPRESSION: 1. Normal MRI appearance of the brain for age. No acute or focal abnormality to explain the patient's symptoms. 2. Left mastoid effusion. No obstructing nasopharyngeal lesion is present. 3. Degenerative changes of the cervical spine adjacent level disease at C2-3 following fusion at C3-4. Electronically Signed   By: San Morelle M.D.   On: 07/17/2018 12:29   US Renal  Result Date: 07/16/2018 CLINICAL DATA:  Acute renal failure EXAM: RENAL / URINARY TRACT ULTRASOUND COMPLETE COMPARISON:  None. FINDINGS: Right Kidney: Renal measurements: 10.3 x 4.9 x 5.3 cm = volume: 139  mL. Diffuse renal parenchymal atrophy. No hydronephrosis or mass lesion identified. Left Kidney: Renal measurements: 8.7 x 4.8 x 5 cm = volume: 109 mL. Limited visualization. Lower pole is obscured by overlying bowel gas. There appears to be diffuse renal parenchymal atrophy. No mass or hydronephrosis demonstrated as visualized. Bladder: Bladder is not visualized, completely decompressed. IMPRESSION: Bilateral diffuse renal parenchymal atrophy.  No hydronephrosis. Electronically Signed   By: Lucienne Capers M.D.   On: 07/16/2018 20:19   Dg Chest Port 1 View  Result Date: 07/26/2018 CLINICAL DATA:  Dyspnea. EXAM: PORTABLE CHEST 1 VIEW COMPARISON:  07/19/2018 FINDINGS: The Chin  overlies the apices. Normal heart size for level of inspiration. Atherosclerosis in the transverse aorta. Mild right hemidiaphragm elevation. No pleural effusion or pneumothorax. Mild subsegmental atelectasis at the right lung base. IMPRESSION: No acute cardiopulmonary disease. Aortic Atherosclerosis (ICD10-I70.0). Electronically Signed   By: Abigail Miyamoto M.D.   On: 07/26/2018 13:20   Dg Chest Port 1 View  Result Date: 07/17/2018 CLINICAL DATA:  Crackles in the lung bases on auscultatory examination. EXAM: PORTABLE CHEST 1 VIEW COMPARISON:  07/16/2018 and earlier. FINDINGS: Cardiac silhouette mildly to moderately enlarged for AP portable technique, unchanged. Minimal atelectasis at the RIGHT lung base, new since yesterday. Lungs otherwise clear. No confluent airspace consolidation. No visible pleural effusions, though the costophrenic sulci are excluded from the image. IMPRESSION: Minimal RIGHT basilar atelectasis. No acute cardiopulmonary disease otherwise. Stable cardiomegaly without pulmonary edema. Electronically Signed   By: Evangeline Dakin M.D.   On: 07/17/2018 21:41   Dg Chest Port 1 View  Result Date: 07/16/2018 CLINICAL DATA:  Patient found down by family. EXAM: PORTABLE CHEST 1 VIEW COMPARISON:  03/07/2017 FINDINGS: The heart size and mediastinal contours are within normal limits. Aortic atherosclerosis is noted at the arch, stable in appearance without aneurysm. Both lungs are clear. The visualized skeletal structures are unremarkable. IMPRESSION: No active disease. Electronically Signed   By: Ashley Royalty M.D.   On: 07/16/2018 14:56   Dg Knee Complete 4 Views Left  Result Date: 07/16/2018 CLINICAL DATA:  Left knee swelling. Patient found down today. EXAM: LEFT KNEE - COMPLETE 4+ VIEW COMPARISON:  03/04/2016 FINDINGS: Tricompartmental osteoarthritis of the knee is redemonstrated with moderate femorotibial and patellofemoral joint space narrowing and spurring identified. Cortical irregularity  of the anterior aspect of the medial femoral condyle is seen on the lateral view which appears more likely to be chronic as it appears likely present on the previous exam though more obscured by overlap from the patient's patella. A moderate-sized suprapatellar joint effusion is identified on current exam, increased from prior without fat fluid level. IMPRESSION: 1. 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 2. Cortical irregularity of the anterior aspect of the medial femoral condyle is seen on the lateral view which appears more likely to be chronic as it appears more obscured by overlap from the patient's patella. If there is pain out of proportion to radiographic findings however, consider cross-sectional imaging such as CT for better assessment. - Electronically Signed   By: Ashley Royalty M.D.   On: 07/16/2018 17:09     LOS: 12 days   Oren Binet, MD  [facility]  If 7PM-7AM, please contact night-coverage  Please page via [email or web address]-Password [email or web address]-click on MD name and type text message  07/28/2018, 10:05 AM

## 2018-07-28 NOTE — Progress Notes (Signed)
CSW submitted for insurance authorization for [facility].   Harold Locus Nosson Wender LCSW [phone]

## 2018-07-29 LAB — GLUCOSE, CAPILLARY
Glucose-Capillary: 153 mg/dL — ABNORMAL HIGH (ref 70–99)
Glucose-Capillary: 155 mg/dL — ABNORMAL HIGH (ref 70–99)
Glucose-Capillary: 160 mg/dL — ABNORMAL HIGH (ref 70–99)

## 2018-07-29 MED ORDER — INSULIN ASPART 100 UNIT/ML FLEXPEN: PEN_INJECTOR | SUBCUTANEOUS | 0 refills | Status: AC

## 2018-07-29 MED ORDER — ARFORMOTEROL TARTRATE 15 MCG/2ML IN NEBU: 15.0000 ug | INHALATION_SOLUTION | Freq: Two times a day (BID) | RESPIRATORY_TRACT | Status: AC

## 2018-07-29 MED ORDER — INSULIN NPH (HUMAN) (ISOPHANE) 100 UNIT/ML ~~LOC~~ SUSP: 20.0000 [IU] | Freq: Every day | SUBCUTANEOUS | 11 refills | Status: AC

## 2018-07-29 MED ORDER — POLYETHYLENE GLYCOL 3350 17 G PO PACK: 17.0000 g | PACK | Freq: Every day | ORAL | 0 refills | Status: AC

## 2018-07-29 MED ORDER — BUDESONIDE 0.25 MG/2ML IN SUSP: 0.2500 mg | Freq: Two times a day (BID) | RESPIRATORY_TRACT | 12 refills | Status: AC

## 2018-07-29 MED ORDER — ALBUTEROL SULFATE (2.5 MG/3ML) 0.083% IN NEBU: 2.5000 mg | INHALATION_SOLUTION | RESPIRATORY_TRACT | 12 refills | Status: AC | PRN

## 2018-07-29 MED ORDER — LEVOTHYROXINE SODIUM 125 MCG PO TABS: 175.0000 ug | ORAL_TABLET | Freq: Every day | ORAL | Status: AC

## 2018-07-29 NOTE — Progress Notes (Signed)
Patient will DC to: [facility] Anticipated DC date: 07/29/2018 Family notified: Daughter, Landscape architect by: Corey Harold   Per MD patient ready for DC to [facility]. RN, patient, patient's family, and facility notified of DC. Discharge Summary and FL2 sent to facility. RN to call report prior to discharge [phone] Room 103). DC packet on chart. Ambulance transport requested for patient.   CSW will sign off for now as social work intervention is no longer needed. Please consult [place] again if new needs arise.  Cedric Fishman, LCSW Clinical Social Worker [phone]

## 2018-07-29 NOTE — Progress Notes (Signed)
CSW received insurance approval for patient to discharge to [facility] today.   Percell Locus Tylerjames Hoglund LCSW [phone]

## 2018-07-29 NOTE — Progress Notes (Signed)
Pt prepared for d/c to SNF. IV d/c'd. Skin intact except as charted in most recent assessments. Vitals are stable. Report called to receiving facility. Pt to be transported by ambulance service. 

## 2018-07-29 NOTE — Discharge Summary (Signed)
PATIENT DETAILS Name: Harold Martin Age: 78 y.o. Sex: male Date of Birth: October 29, 1940 MRN: [id number]. Admitting Physician: Thurnell Lose, MD SEG:BTDVV, Thayer Jew, MD  Admit Date: 07/16/2018 Discharge date: 07/29/2018  Recommendations for Outpatient Follow-up:  1. Follow up with PCP in 1-2 weeks 2. Please obtain BMP/CBC in one week 3. Repeat TSH in 3 months 4. Ensure outpatient follow-up with endocrinology 5. Ensure wound care services at SNF   Admitted From:  Home   Disposition: SNF   Home Health: No  Equipment/Devices: None  Discharge Condition: Stable  CODE STATUS: FULL CODE  Diet recommendation:  Heart Healthy / Carb Modified Diet recommendations: Dysphagia 2 (fine chop);Nectar-thick liquid Liquids provided via: Straw Medication Administration: Whole meds with puree Supervision: Full supervision/cueing for compensatory strategies;Staff to assist with self feeding Compensations: Slow rate;Small sips/bites;Follow solids with liquid Postural Changes and/or Swallow Maneuvers: Seated upright 90 degrees(as upright as possible)  Brief Summary: See H&P, Labs, Consult and Test reports for all details in brief, Patient is a 78 y.o. male with history of COPD, hypothyroidism, insulin-dependent DM-2-presented to the hospital for evaluation of altered mental status, his TSH was significantly elevated-he was thought to have myxedema-and started on IV Synthroid and steroids with improvement.  Hospital course now complicated by waxing and waning delirium.  See below for further details  Brief Hospital Course: Acute metabolic encephalopathy: Likely secondary to myxedema-mental status improved with IV Synthroid, steroids.  Hospital course then complicated by waxing and waning confusion-high suspicion for delirium-he has had episodes of delirium during his prior hospitalizations as well.  Delirium has significantly improved-this morning although somewhat slow-is able to follow  commands.  Per RN-he was able to tell her his name.  Extensive work-up, including MRI brain and EEG were negative for acute abnormalities.  Severe hypothyroidism with myxedema: Much improved-after initiation of IV levothyroxine-no longer on steroids.  Will need repeat TSH in 3 months.  Suspect that patient was noncompliant to medications in the outpatient setting.    TSH has decreased to 8.5 (87.6 on admission)  AKI on CKD stage III: AKI hemodynamically mediated-creatinine improved- close to usual baseline.  Hyponatremia: Likely secondary to dehydration-resolved.  Rhabdomyolysis: Treated with IV fluids-secondary to being down on admission-resolved with supportive care and IV fluids.  COPD: Lungs are much clearer today-continue bronchodilators.   Hypertension: Blood pressure stable-not on any antihypertensives  Insulin-dependent-DM-2: CBG stable-continue NPH 20 units daily and SSI.    BPH: Continue Flomax.  Deconditioning/debility: Appears to be significantly more debilitated than usual baseline-this is likely secondary to acute illness-we will need SNF on discharge.  Deep tissue injury to bilateral heels, sacrum: Appreciate wound care input-see wound care instructions below.  Procedures/Studies: None  Discharge Diagnoses:  Principal Problem:   Acute encephalopathy Active Problems:   EMPHYSEMA   Acute renal failure superimposed on stage 3 chronic kidney disease (HCC)   Type 2 diabetes mellitus with hyperglycemia (HCC)   Essential hypertension   COPD (chronic obstructive pulmonary disease) (HCC)   Metabolic encephalopathy   Pressure injury of skin   Discharge Instructions:  Activity:  As tolerated with Full fall precautions use walker/cane & assistance as needed  Discharge Instructions    Diet - low sodium heart healthy   Complete by:  As directed    Diet recommendations: Dysphagia 2 (fine chop);Nectar-thick liquid Liquids provided via: Straw Medication  Administration: Whole meds with puree Supervision: Full supervision/cueing for compensatory strategies;Staff to assist with self feeding Compensations: Slow rate;Small sips/bites;Follow solids with liquid Postural Changes  and/or Swallow Maneuvers: Seated upright 90 degrees(as upright as possible)   Discharge instructions   Complete by:  As directed    Follow with Primary MD  Anda Kraft, MD in 1 week   Please get a complete blood count and chemistry panel checked by your Primary MD at your next visit, and again as instructed by your Primary MD.  Get Medicines reviewed and adjusted: Please take all your medications with you for your next visit with your Primary MD  Laboratory/radiological data: Please request your Primary MD to go over all hospital tests and procedure/radiological results at the follow up, please ask your Primary MD to get all Hospital records sent to his/her office.  In some cases, they will be blood work, cultures and biopsy results pending at the time of your discharge. Please request that your primary care M.D. follows up on these results.  Also Note the following: If you experience worsening of your admission symptoms, develop shortness of breath, life threatening emergency, suicidal or homicidal thoughts you must seek medical attention immediately by calling 911 or calling your MD immediately  if symptoms less severe.  You must read complete instructions/literature along with all the possible adverse reactions/side effects for all the Medicines you take and that have been prescribed to you. Take any new Medicines after you have completely understood and accpet all the possible adverse reactions/side effects.   Do not drive when taking Pain medications or sleeping medications (Benzodaizepines)  Do not take more than prescribed Pain, Sleep and Anxiety Medications. It is not advisable to combine anxiety,sleep and pain medications without talking with your primary care  practitioner  Special Instructions: If you have smoked or chewed Tobacco  in the last 2 yrs please stop smoking, stop any regular Alcohol  and or any Recreational drug use.  Wear Seat belts while driving.  Please note: You were cared for by a hospitalist during your hospital stay. Once you are discharged, your primary care physician will handle any further medical issues. Please note that NO REFILLS for any discharge medications will be authorized once you are discharged, as it is imperative that you return to your primary care physician (or establish a relationship with a primary care physician if you do not have one) for your post hospital discharge needs so that they can reassess your need for medications and monitor your lab values.   CBGs before meals and at bedtime   Discharge wound care:   Complete by:  As directed    1. Add low air loss mattress for pressure redistribution 2. Add Prevalon boots bilaterally for off loading of the heel ulcers 3. Silicone foam to all wounds to protect, insulate, absorb exudate 4. Maximize nutrition for wound healing   Increase activity slowly   Complete by:  As directed      Allergies as of 07/29/2018      Reactions   Prednisone Other (See Comments)   REACTION: Reaction not known      Medication List    STOP taking these medications   MELATIN PO   metoprolol succinate 25 MG 24 hr tablet Commonly known as:  TOPROL-XL     TAKE these medications   albuterol (2.5 MG/3ML) 0.083% nebulizer solution Commonly known as:  PROVENTIL Take 3 mLs (2.5 mg total) by nebulization every 2 (two) hours as needed for shortness of breath.   arformoterol 15 MCG/2ML Nebu Commonly known as:  BROVANA Take 2 mLs (15 mcg total) by nebulization 2 (two) times  daily.   aspirin 81 MG chewable tablet Commonly known as:  ASPIRIN CHILDRENS Chew 1 tablet (81 mg total) by mouth daily.   budesonide 0.25 MG/2ML nebulizer solution Commonly known as:  PULMICORT Take 2  mLs (0.25 mg total) by nebulization 2 (two) times daily.   chlorhexidine 0.12 % solution Commonly known as:  PERIDEX 15 mLs by Mouth Rinse route 2 (two) times daily.   glipiZIDE 10 MG tablet Commonly known as:  GLUCOTROL Take 10 mg by mouth daily.   insulin aspart 100 UNIT/ML FlexPen Commonly known as:  NOVOLOG FLEXPEN 0-15 Units, Subcutaneous, 3 times daily with meals CBG < 70: implement hypoglycemia protocol-call MD CBG 70 - 120: 0 units CBG 121 - 150: 2 units CBG 151 - 200: 3 units CBG 201 - 250: 5 units CBG 251 - 300: 8 units CBG 301 - 350: 11 units CBG 351 - 400: 15 units CBG > 400: call MD   insulin NPH Human 100 UNIT/ML injection Commonly known as:  HUMULIN N,NOVOLIN N Inject 0.2 mLs (20 Units total) into the skin daily. What changed:  how much to take   levothyroxine 125 MCG tablet Commonly known as:  SYNTHROID, LEVOTHROID Take 1.5 tablets (187.5 mcg total) by mouth daily before breakfast. What changed:  how much to take   magnesium oxide 400 MG tablet Commonly known as:  MAG-OX Take 400 mg by mouth at bedtime.   multivitamin with minerals Tabs tablet Take 1 tablet by mouth daily.   polyethylene glycol packet Commonly known as:  MIRALAX / GLYCOLAX Take 17 g by mouth daily. Start taking on:  July 30, 2018   tamsulosin 0.4 MG Caps capsule Commonly known as:  FLOMAX Take 0.4 mg by mouth daily at 12 noon.            Discharge Care Instructions  (From admission, onward)         Start     Ordered   07/29/18 0000  Discharge wound care:    Comments:  1. Add low air loss mattress for pressure redistribution 2. Add Prevalon boots bilaterally for off loading of the heel ulcers 3. Silicone foam to all wounds to protect, insulate, absorb exudate 4. Maximize nutrition for wound healing   07/29/18 0941          Contact information for follow-up providers    Anda Kraft, MD. Schedule an appointment as soon as possible for a visit in 1 week(s).     Specialty:  Endocrinology Contact information: [address] [place] [address] [phone]            Contact information for after-discharge care    Destination    [facility] .   Service:  Skilled Nursing Contact information: [address] [place] [address] [phone]                 Allergies  Allergen Reactions  . Prednisone Other (See Comments)    REACTION: Reaction not known    Consultations:   neurology   Other Procedures/Studies: Dg Chest 2 View  Result Date: 07/19/2018 CLINICAL DATA:  Cough, question aspiration pneumonia or pneumonia EXAM: CHEST - 2 VIEW COMPARISON:  07/17/2018 FINDINGS: Upper normal heart size. Mediastinal contours and pulmonary vascularity normal. Atherosclerotic calcification aorta. Small amount of barium at and presumed barium tablet are identified within thoracic esophagus. Lungs clear. No pleural effusion or pneumothorax. No acute osseous findings. IMPRESSION: Retained barium and presumed barium tablet  within thoracic esophagus. No acute pulmonary abnormalities. Electronically Signed   By: Lavonia Dana M.D.   On: 07/19/2018 13:47   Ct Head Wo Contrast  Result Date: 07/16/2018 CLINICAL DATA:  78 year old male with history of trauma. Altered mental status. Found down. EXAM: CT HEAD WITHOUT CONTRAST CT CERVICAL SPINE WITHOUT CONTRAST TECHNIQUE: Multidetector CT imaging of the head and cervical spine was performed following the standard protocol without intravenous contrast. Multiplanar CT image reconstructions of the cervical spine were also generated. COMPARISON:  Head CT 03/07/2017.  Cervical spine CT 03/03/2016. FINDINGS: CT HEAD FINDINGS Brain: Mild cerebral atrophy. Patchy and confluent areas of decreased attenuation are noted throughout the deep and periventricular white matter of the cerebral hemispheres bilaterally, compatible with chronic microvascular ischemic  disease. No evidence of acute infarction, hemorrhage, hydrocephalus, extra-axial collection or mass lesion/mass effect. Vascular: No hyperdense vessel or unexpected calcification. Skull: Normal. Negative for fracture or focal lesion. Sinuses/Orbits: Small mucosal retention cyst or polyp in the anterior aspect of the left maxillary sinus incidentally noted. No acute finding. Other: None. CT CERVICAL SPINE FINDINGS Alignment: Exaggerated lordosis at C2-C3, likely positional. Alignment is otherwise anatomic. Skull base and vertebrae: Status post ACDF at C3-C4 with complete bony fusion at this level. No acute fracture. No primary bone lesion or focal pathologic process. Soft tissues and spinal canal: No prevertebral fluid or swelling. No visible canal hematoma. Disc levels: Multilevel degenerative disc disease, most severe at C6-C7 and T1-T2. Moderate multilevel facet arthropathy. Upper chest: Emphysema. Other: None. IMPRESSION: 1. No evidence of significant acute traumatic injury to the skull, brain or cervical spine. 2. Mild cerebral atrophy with chronic microvascular ischemic changes in the cerebral white matter. 3. Multilevel degenerative disc disease and cervical spondylosis, with postoperative changes of ACDF at C3-C4, as above. Electronically Signed   By: Vinnie Langton M.D.   On: 07/16/2018 16:24   Ct Cervical Spine Wo Contrast  Result Date: 07/16/2018 CLINICAL DATA:  78 year old male with history of trauma. Altered mental status. Found down. EXAM: CT HEAD WITHOUT CONTRAST CT CERVICAL SPINE WITHOUT CONTRAST TECHNIQUE: Multidetector CT imaging of the head and cervical spine was performed following the standard protocol without intravenous contrast. Multiplanar CT image reconstructions of the cervical spine were also generated. COMPARISON:  Head CT 03/07/2017.  Cervical spine CT 03/03/2016. FINDINGS: CT HEAD FINDINGS Brain: Mild cerebral atrophy. Patchy and confluent areas of decreased attenuation are noted  throughout the deep and periventricular white matter of the cerebral hemispheres bilaterally, compatible with chronic microvascular ischemic disease. No evidence of acute infarction, hemorrhage, hydrocephalus, extra-axial collection or mass lesion/mass effect. Vascular: No hyperdense vessel or unexpected calcification. Skull: Normal. Negative for fracture or focal lesion. Sinuses/Orbits: Small mucosal retention cyst or polyp in the anterior aspect of the left maxillary sinus incidentally noted. No acute finding. Other: None. CT CERVICAL SPINE FINDINGS Alignment: Exaggerated lordosis at C2-C3, likely positional. Alignment is otherwise anatomic. Skull base and vertebrae: Status post ACDF at C3-C4 with complete bony fusion at this level. No acute fracture. No primary bone lesion or focal pathologic process. Soft tissues and spinal canal: No prevertebral fluid or swelling. No visible canal hematoma. Disc levels: Multilevel degenerative disc disease, most severe at C6-C7 and T1-T2. Moderate multilevel facet arthropathy. Upper chest: Emphysema. Other: None. IMPRESSION: 1. No evidence of significant acute traumatic injury to the skull, brain or cervical spine. 2. Mild cerebral atrophy with chronic microvascular ischemic changes in the cerebral white matter. 3. Multilevel degenerative disc disease and cervical spondylosis, with postoperative changes  of ACDF at C3-C4, as above. Electronically Signed   By: Vinnie Langton M.D.   On: 07/16/2018 16:24   Mr Brain Wo Contrast  Result Date: 07/17/2018 CLINICAL DATA:  Altered level of consciousness, unexplained. Encephalopathy. EXAM: MRI HEAD WITHOUT CONTRAST TECHNIQUE: Multiplanar, multiecho pulse sequences of the brain and surrounding structures were obtained without intravenous contrast. COMPARISON:  CT head without contrast 07/16/2018 FINDINGS: Brain: Study is mildly degraded by patient motion. Mild generalized atrophy and white matter disease is within normal limits for  age. No acute infarct, hemorrhage, or mass lesion is present. The ventricles are of proportionate to the degree of atrophy. No significant extraaxial fluid collection is present. The internal auditory canals are within normal limits. The brainstem and cerebellum are within normal limits. Vascular: Flow is present in the major intracranial arteries. Skull and upper cervical spine: Cervical fusion is noted at C3-4. There is slight retrolisthesis and disc disease at C2-3. The craniocervical junction is normal. Midline structures are unremarkable otherwise. Exaggerated cervical lordosis is noted. Sinuses/Orbits: The paranasal sinuses and right mastoid air cells are clear. A left mastoid effusion is present. No obstructing nasopharyngeal lesion is present. The globes and orbits are within normal limits. IMPRESSION: 1. Normal MRI appearance of the brain for age. No acute or focal abnormality to explain the patient's symptoms. 2. Left mastoid effusion. No obstructing nasopharyngeal lesion is present. 3. Degenerative changes of the cervical spine adjacent level disease at C2-3 following fusion at C3-4. Electronically Signed   By: San Morelle M.D.   On: 07/17/2018 12:29   US Renal  Result Date: 07/16/2018 CLINICAL DATA:  Acute renal failure EXAM: RENAL / URINARY TRACT ULTRASOUND COMPLETE COMPARISON:  None. FINDINGS: Right Kidney: Renal measurements: 10.3 x 4.9 x 5.3 cm = volume: 139 mL. Diffuse renal parenchymal atrophy. No hydronephrosis or mass lesion identified. Left Kidney: Renal measurements: 8.7 x 4.8 x 5 cm = volume: 109 mL. Limited visualization. Lower pole is obscured by overlying bowel gas. There appears to be diffuse renal parenchymal atrophy. No mass or hydronephrosis demonstrated as visualized. Bladder: Bladder is not visualized, completely decompressed. IMPRESSION: Bilateral diffuse renal parenchymal atrophy.  No hydronephrosis. Electronically Signed   By: Lucienne Capers M.D.   On: 07/16/2018  20:19   Dg Chest Port 1 View  Result Date: 07/26/2018 CLINICAL DATA:  Dyspnea. EXAM: PORTABLE CHEST 1 VIEW COMPARISON:  07/19/2018 FINDINGS: The Chin overlies the apices. Normal heart size for level of inspiration. Atherosclerosis in the transverse aorta. Mild right hemidiaphragm elevation. No pleural effusion or pneumothorax. Mild subsegmental atelectasis at the right lung base. IMPRESSION: No acute cardiopulmonary disease. Aortic Atherosclerosis (ICD10-I70.0). Electronically Signed   By: Abigail Miyamoto M.D.   On: 07/26/2018 13:20   Dg Chest Port 1 View  Result Date: 07/17/2018 CLINICAL DATA:  Crackles in the lung bases on auscultatory examination. EXAM: PORTABLE CHEST 1 VIEW COMPARISON:  07/16/2018 and earlier. FINDINGS: Cardiac silhouette mildly to moderately enlarged for AP portable technique, unchanged. Minimal atelectasis at the RIGHT lung base, new since yesterday. Lungs otherwise clear. No confluent airspace consolidation. No visible pleural effusions, though the costophrenic sulci are excluded from the image. IMPRESSION: Minimal RIGHT basilar atelectasis. No acute cardiopulmonary disease otherwise. Stable cardiomegaly without pulmonary edema. Electronically Signed   By: Evangeline Dakin M.D.   On: 07/17/2018 21:41   Dg Chest Port 1 View  Result Date: 07/16/2018 CLINICAL DATA:  Patient found down by family. EXAM: PORTABLE CHEST 1 VIEW COMPARISON:  03/07/2017 FINDINGS: The heart size  and mediastinal contours are within normal limits. Aortic atherosclerosis is noted at the arch, stable in appearance without aneurysm. Both lungs are clear. The visualized skeletal structures are unremarkable. IMPRESSION: No active disease. Electronically Signed   By: Ashley Royalty M.D.   On: 07/16/2018 14:56   Dg Knee Complete 4 Views Left  Result Date: 07/16/2018 CLINICAL DATA:  Left knee swelling. Patient found down today. EXAM: LEFT KNEE - COMPLETE 4+ VIEW COMPARISON:  03/04/2016 FINDINGS: Tricompartmental  osteoarthritis of the knee is redemonstrated with moderate femorotibial and patellofemoral joint space narrowing and spurring identified. Cortical irregularity of the anterior aspect of the medial femoral condyle is seen on the lateral view which appears more likely to be chronic as it appears likely present on the previous exam though more obscured by overlap from the patient's patella. A moderate-sized suprapatellar joint effusion is identified on current exam, increased from prior without fat fluid level. IMPRESSION: 1. 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 2. Cortical irregularity of the anterior aspect of the medial femoral condyle is seen on the lateral view which appears more likely to be chronic as it appears more obscured by overlap from the patient's patella. If there is pain out of proportion to radiographic findings however, consider cross-sectional imaging such as CT for better assessment. - Electronically Signed   By: Ashley Royalty M.D.   On: 07/16/2018 17:09     TODAY-DAY OF DISCHARGE:  Subjective:   Harold Martin today continues to improve-he is awake-follows commands.  He apparently wears dentures-did speak with his daughter-she has these dentures at home-I suspect that is why his speech is somewhat garbled.  Objective:   Blood pressure 114/69, pulse 92, temperature 98.9 F (37.2 C), temperature source Oral, resp. rate 20, height 6\' 1"  (1.854 m), weight 101.3 kg, SpO2 95 %.  Intake/Output Summary (Last 24 hours) at 07/29/2018 0943 Last data filed at 07/29/2018 0401 Gross per 24 hour  Intake -  Output 240 ml  Net -240 ml   Filed Weights   07/16/18 1920 07/17/18 0347  Weight: 100.1 kg 101.3 kg    Exam: Awake Alert,  No new F.N deficits, Normal affect [place].AT,PERRAL Supple Neck,No JVD, No cervical lymphadenopathy appriciated.  Symmetrical Chest wall movement, Good air movement bilaterally, CTAB RRR,No Gallops,Rubs or new Murmurs, No  Parasternal Heave +ve B.Sounds, Abd Soft, Non tender, No organomegaly appriciated, No rebound -guarding or rigidity. No Cyanosis, Clubbing or edema, No new Rash or bruise   PERTINENT RADIOLOGIC STUDIES: Dg Chest 2 View  Result Date: 07/19/2018 CLINICAL DATA:  Cough, question aspiration pneumonia or pneumonia EXAM: CHEST - 2 VIEW COMPARISON:  07/17/2018 FINDINGS: Upper normal heart size. Mediastinal contours and pulmonary vascularity normal. Atherosclerotic calcification aorta. Small amount of barium at and presumed barium tablet are identified within thoracic esophagus. Lungs clear. No pleural effusion or pneumothorax. No acute osseous findings. IMPRESSION: Retained barium and presumed barium tablet within thoracic esophagus. No acute pulmonary abnormalities. Electronically Signed   By: Lavonia Dana M.D.   On: 07/19/2018 13:47   Ct Head Wo Contrast  Result Date: 07/16/2018 CLINICAL DATA:  78 year old male with history of trauma. Altered mental status. Found down. EXAM: CT HEAD WITHOUT CONTRAST CT CERVICAL SPINE WITHOUT CONTRAST TECHNIQUE: Multidetector CT imaging of the head and cervical spine was performed following the standard protocol without intravenous contrast. Multiplanar CT image reconstructions of the cervical spine were also generated. COMPARISON:  Head CT 03/07/2017.  Cervical spine CT 03/03/2016. FINDINGS: CT HEAD FINDINGS Brain:  Mild cerebral atrophy. Patchy and confluent areas of decreased attenuation are noted throughout the deep and periventricular white matter of the cerebral hemispheres bilaterally, compatible with chronic microvascular ischemic disease. No evidence of acute infarction, hemorrhage, hydrocephalus, extra-axial collection or mass lesion/mass effect. Vascular: No hyperdense vessel or unexpected calcification. Skull: Normal. Negative for fracture or focal lesion. Sinuses/Orbits: Small mucosal retention cyst or polyp in the anterior aspect of the left maxillary sinus  incidentally noted. No acute finding. Other: None. CT CERVICAL SPINE FINDINGS Alignment: Exaggerated lordosis at C2-C3, likely positional. Alignment is otherwise anatomic. Skull base and vertebrae: Status post ACDF at C3-C4 with complete bony fusion at this level. No acute fracture. No primary bone lesion or focal pathologic process. Soft tissues and spinal canal: No prevertebral fluid or swelling. No visible canal hematoma. Disc levels: Multilevel degenerative disc disease, most severe at C6-C7 and T1-T2. Moderate multilevel facet arthropathy. Upper chest: Emphysema. Other: None. IMPRESSION: 1. No evidence of significant acute traumatic injury to the skull, brain or cervical spine. 2. Mild cerebral atrophy with chronic microvascular ischemic changes in the cerebral white matter. 3. Multilevel degenerative disc disease and cervical spondylosis, with postoperative changes of ACDF at C3-C4, as above. Electronically Signed   By: Vinnie Langton M.D.   On: 07/16/2018 16:24   Ct Cervical Spine Wo Contrast  Result Date: 07/16/2018 CLINICAL DATA:  78 year old male with history of trauma. Altered mental status. Found down. EXAM: CT HEAD WITHOUT CONTRAST CT CERVICAL SPINE WITHOUT CONTRAST TECHNIQUE: Multidetector CT imaging of the head and cervical spine was performed following the standard protocol without intravenous contrast. Multiplanar CT image reconstructions of the cervical spine were also generated. COMPARISON:  Head CT 03/07/2017.  Cervical spine CT 03/03/2016. FINDINGS: CT HEAD FINDINGS Brain: Mild cerebral atrophy. Patchy and confluent areas of decreased attenuation are noted throughout the deep and periventricular white matter of the cerebral hemispheres bilaterally, compatible with chronic microvascular ischemic disease. No evidence of acute infarction, hemorrhage, hydrocephalus, extra-axial collection or mass lesion/mass effect. Vascular: No hyperdense vessel or unexpected calcification. Skull: Normal.  Negative for fracture or focal lesion. Sinuses/Orbits: Small mucosal retention cyst or polyp in the anterior aspect of the left maxillary sinus incidentally noted. No acute finding. Other: None. CT CERVICAL SPINE FINDINGS Alignment: Exaggerated lordosis at C2-C3, likely positional. Alignment is otherwise anatomic. Skull base and vertebrae: Status post ACDF at C3-C4 with complete bony fusion at this level. No acute fracture. No primary bone lesion or focal pathologic process. Soft tissues and spinal canal: No prevertebral fluid or swelling. No visible canal hematoma. Disc levels: Multilevel degenerative disc disease, most severe at C6-C7 and T1-T2. Moderate multilevel facet arthropathy. Upper chest: Emphysema. Other: None. IMPRESSION: 1. No evidence of significant acute traumatic injury to the skull, brain or cervical spine. 2. Mild cerebral atrophy with chronic microvascular ischemic changes in the cerebral white matter. 3. Multilevel degenerative disc disease and cervical spondylosis, with postoperative changes of ACDF at C3-C4, as above. Electronically Signed   By: Vinnie Langton M.D.   On: 07/16/2018 16:24   Mr Brain Wo Contrast  Result Date: 07/17/2018 CLINICAL DATA:  Altered level of consciousness, unexplained. Encephalopathy. EXAM: MRI HEAD WITHOUT CONTRAST TECHNIQUE: Multiplanar, multiecho pulse sequences of the brain and surrounding structures were obtained without intravenous contrast. COMPARISON:  CT head without contrast 07/16/2018 FINDINGS: Brain: Study is mildly degraded by patient motion. Mild generalized atrophy and white matter disease is within normal limits for age. No acute infarct, hemorrhage, or mass lesion is present. The ventricles  are of proportionate to the degree of atrophy. No significant extraaxial fluid collection is present. The internal auditory canals are within normal limits. The brainstem and cerebellum are within normal limits. Vascular: Flow is present in the major  intracranial arteries. Skull and upper cervical spine: Cervical fusion is noted at C3-4. There is slight retrolisthesis and disc disease at C2-3. The craniocervical junction is normal. Midline structures are unremarkable otherwise. Exaggerated cervical lordosis is noted. Sinuses/Orbits: The paranasal sinuses and right mastoid air cells are clear. A left mastoid effusion is present. No obstructing nasopharyngeal lesion is present. The globes and orbits are within normal limits. IMPRESSION: 1. Normal MRI appearance of the brain for age. No acute or focal abnormality to explain the patient's symptoms. 2. Left mastoid effusion. No obstructing nasopharyngeal lesion is present. 3. Degenerative changes of the cervical spine adjacent level disease at C2-3 following fusion at C3-4. Electronically Signed   By: San Morelle M.D.   On: 07/17/2018 12:29   US Renal  Result Date: 07/16/2018 CLINICAL DATA:  Acute renal failure EXAM: RENAL / URINARY TRACT ULTRASOUND COMPLETE COMPARISON:  None. FINDINGS: Right Kidney: Renal measurements: 10.3 x 4.9 x 5.3 cm = volume: 139 mL. Diffuse renal parenchymal atrophy. No hydronephrosis or mass lesion identified. Left Kidney: Renal measurements: 8.7 x 4.8 x 5 cm = volume: 109 mL. Limited visualization. Lower pole is obscured by overlying bowel gas. There appears to be diffuse renal parenchymal atrophy. No mass or hydronephrosis demonstrated as visualized. Bladder: Bladder is not visualized, completely decompressed. IMPRESSION: Bilateral diffuse renal parenchymal atrophy.  No hydronephrosis. Electronically Signed   By: Lucienne Capers M.D.   On: 07/16/2018 20:19   Dg Chest Port 1 View  Result Date: 07/26/2018 CLINICAL DATA:  Dyspnea. EXAM: PORTABLE CHEST 1 VIEW COMPARISON:  07/19/2018 FINDINGS: The Chin overlies the apices. Normal heart size for level of inspiration. Atherosclerosis in the transverse aorta. Mild right hemidiaphragm elevation. No pleural effusion or  pneumothorax. Mild subsegmental atelectasis at the right lung base. IMPRESSION: No acute cardiopulmonary disease. Aortic Atherosclerosis (ICD10-I70.0). Electronically Signed   By: Abigail Miyamoto M.D.   On: 07/26/2018 13:20   Dg Chest Port 1 View  Result Date: 07/17/2018 CLINICAL DATA:  Crackles in the lung bases on auscultatory examination. EXAM: PORTABLE CHEST 1 VIEW COMPARISON:  07/16/2018 and earlier. FINDINGS: Cardiac silhouette mildly to moderately enlarged for AP portable technique, unchanged. Minimal atelectasis at the RIGHT lung base, new since yesterday. Lungs otherwise clear. No confluent airspace consolidation. No visible pleural effusions, though the costophrenic sulci are excluded from the image. IMPRESSION: Minimal RIGHT basilar atelectasis. No acute cardiopulmonary disease otherwise. Stable cardiomegaly without pulmonary edema. Electronically Signed   By: Evangeline Dakin M.D.   On: 07/17/2018 21:41   Dg Chest Port 1 View  Result Date: 07/16/2018 CLINICAL DATA:  Patient found down by family. EXAM: PORTABLE CHEST 1 VIEW COMPARISON:  03/07/2017 FINDINGS: The heart size and mediastinal contours are within normal limits. Aortic atherosclerosis is noted at the arch, stable in appearance without aneurysm. Both lungs are clear. The visualized skeletal structures are unremarkable. IMPRESSION: No active disease. Electronically Signed   By: Ashley Royalty M.D.   On: 07/16/2018 14:56   Dg Knee Complete 4 Views Left  Result Date: 07/16/2018 CLINICAL DATA:  Left knee swelling. Patient found down today. EXAM: LEFT KNEE - COMPLETE 4+ VIEW COMPARISON:  03/04/2016 FINDINGS: Tricompartmental osteoarthritis of the knee is redemonstrated with moderate femorotibial and patellofemoral joint space narrowing and spurring identified. Cortical irregularity  of the anterior aspect of the medial femoral condyle is seen on the lateral view which appears more likely to be chronic as it appears likely present on the previous  exam though more obscured by overlap from the patient's patella. A moderate-sized suprapatellar joint effusion is identified on current exam, increased from prior without fat fluid level. IMPRESSION: 1. 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 2. Cortical irregularity of the anterior aspect of the medial femoral condyle is seen on the lateral view which appears more likely to be chronic as it appears more obscured by overlap from the patient's patella. If there is pain out of proportion to radiographic findings however, consider cross-sectional imaging such as CT for better assessment. - Electronically Signed   By: Ashley Royalty M.D.   On: 07/16/2018 17:09     PERTINENT LAB RESULTS: CBC: Recent Labs    07/27/18 0413 07/28/18 0324  WBC 7.0 6.3  HGB 10.8* 10.6*  HCT 35.6* 35.1*  PLT 145* 150   CMET CMP     Component Value Date/Time   NA 146 (H) 07/28/2018 0324   K 4.4 07/28/2018 0324   CL 106 07/28/2018 0324   CO2 31 07/28/2018 0324   GLUCOSE 146 (H) 07/28/2018 0324   BUN 35 (H) 07/28/2018 0324   CREATININE 1.63 (H) 07/28/2018 0324   CALCIUM 9.2 07/28/2018 0324   PROT 6.2 (L) 07/27/2018 0413   ALBUMIN 3.2 (L) 07/27/2018 0413   AST 43 (H) 07/27/2018 0413   ALT 42 07/27/2018 0413   ALKPHOS 98 07/27/2018 0413   BILITOT 0.7 07/27/2018 0413   GFRNONAA 41 (L) 07/28/2018 0324   GFRAA 47 (L) 07/28/2018 0324    GFR Estimated Creatinine Clearance: 49 mL/min (A) (by C-G formula based on SCr of 1.63 mg/dL (H)). No results for input(s): LIPASE, AMYLASE in the last 72 hours. Recent Labs    07/27/18 0413  CKTOTAL 374   Invalid input(s): POCBNP No results for input(s): DDIMER in the last 72 hours. No results for input(s): HGBA1C in the last 72 hours. No results for input(s): CHOL, HDL, LDLCALC, TRIG, CHOLHDL, LDLDIRECT in the last 72 hours. No results for input(s): TSH, T4TOTAL, T3FREE, THYROIDAB in the last 72 hours.  Invalid input(s): FREET3 No  results for input(s): VITAMINB12, FOLATE, FERRITIN, TIBC, IRON, RETICCTPCT in the last 72 hours. Coags: No results for input(s): INR in the last 72 hours.  Invalid input(s): PT Microbiology: Recent Results (from the past 240 hour(s))  Urine Culture     Status: Abnormal   Collection Time: 07/26/18 12:01 PM  Result Value Ref Range Status   Specimen Description URINE, RANDOM  Final   Special Requests   Final    NONE Performed at [facility], [address], [place], [place] [address]    Culture >=100,000 COLONIES/mL ENTEROCOCCUS FAECALIS (A)  Final   Report Status 07/28/2018 FINAL  Final   Organism ID, Bacteria ENTEROCOCCUS FAECALIS (A)  Final      Susceptibility   Enterococcus faecalis - MIC*    AMPICILLIN <=2 SENSITIVE Sensitive     LEVOFLOXACIN >=8 RESISTANT Resistant     NITROFURANTOIN <=16 SENSITIVE Sensitive     VANCOMYCIN 1 SENSITIVE Sensitive     * >=100,000 COLONIES/mL ENTEROCOCCUS FAECALIS    FURTHER DISCHARGE INSTRUCTIONS:  Get Medicines reviewed and adjusted: Please take all your medications with you for your next visit with your Primary MD  Laboratory/radiological data: Please request your Primary MD to go over all  hospital tests and procedure/radiological results at the follow up, please ask your Primary MD to get all Hospital records sent to his/her office.  In some cases, they will be blood work, cultures and biopsy results pending at the time of your discharge. Please request that your primary care M.D. goes through all the records of your hospital data and follows up on these results.  Also Note the following: If you experience worsening of your admission symptoms, develop shortness of breath, life threatening emergency, suicidal or homicidal thoughts you must seek medical attention immediately by calling 911 or calling your MD immediately  if symptoms less severe.  You must read complete instructions/literature along with all the possible adverse  reactions/side effects for all the Medicines you take and that have been prescribed to you. Take any new Medicines after you have completely understood and accpet all the possible adverse reactions/side effects.   Do not drive when taking Pain medications or sleeping medications (Benzodaizepines)  Do not take more than prescribed Pain, Sleep and Anxiety Medications. It is not advisable to combine anxiety,sleep and pain medications without talking with your primary care practitioner  Special Instructions: If you have smoked or chewed Tobacco  in the last 2 yrs please stop smoking, stop any regular Alcohol  and or any Recreational drug use.  Wear Seat belts while driving.  Please note: You were cared for by a hospitalist during your hospital stay. Once you are discharged, your primary care physician will handle any further medical issues. Please note that NO REFILLS for any discharge medications will be authorized once you are discharged, as it is imperative that you return to your primary care physician (or establish a relationship with a primary care physician if you do not have one) for your post hospital discharge needs so that they can reassess your need for medications and monitor your lab values.  Total Time spent coordinating discharge including counseling, education and face to face time equals 45 minutes.  SignedOren Binet 07/29/2018 9:43 AM

## 2018-07-29 NOTE — Progress Notes (Addendum)
PROGRESS NOTE        PATIENT DETAILS Name: Harold Martin Age: 78 y.o. Sex: male Date of Birth: [birth year] Admit Date: 07/16/2018 Admitting Physician Thurnell Lose, MD DXI:PJASN, Thayer Jew, MD  Brief Narrative: Patient is a 78 y.o. male with history of COPD, hypothyroidism, insulin-dependent DM-2-presented to the hospital for evaluation of altered mental status, his TSH was significantly elevated-he was thought to have myxedema-and started on IV Synthroid and steroids with improvement.  Hospital course now complicated by waxing and waning delirium.  See below for further details  Subjective: Alert-wakes up easily-squeeze my fingers with both his hands-moves legs when asked.  Speech appears garbled-no family at bedside.  Assessment/Plan: Acute metabolic encephalopathy: Likely secondary to myxedema-mental status improved with IV Synthroid, steroids.  Hospital course then complicated by waxing and waning confusion-high suspicion for delirium-he has had episodes of delirium during his prior hospitalizations as well.  Delirium is slowly improving-this morning although somewhat slow-is able to follow commands.  Per RN-he was able to tell her his name.  Extensive work-up, including MRI brain and EEG were negative for acute abnormalities.  Severe hypothyroidism with myxedema: Much improved-after initiation of IV levothyroxine-no longer on steroids.  Will need repeat TSH in 3 months.  Suspect that patient was noncompliant to medications in the outpatient setting.    AKI on CKD stage III: AKI hemodynamically mediated-creatinine improved- close to usual baseline.  Hyponatremia: Likely secondary to dehydration-resolved.  Rhabdomyolysis: Treated with IV fluids-secondary to being down on admission-resolved with supportive care and IV fluids.  COPD: Lungs are much clearer today-continue bronchodilators.   Hypertension: Blood pressure stable-not on any  antihypertensives  Insulin-dependent-DM-2: CBG stable-continue NPH 20 units daily and SSI.    BPH: Continue Flomax.  Deconditioning/debility: Appears to be significantly more debilitated than usual baseline-this is likely secondary to acute illness-we will need SNF on discharge.  DVT Prophylaxis: Prophylactic Heparin   Code Status: Full code   Family Communication: Left message for patient's daughter.  Addendum: Did receive a call back from patient's daughter later in the morning-explained plans for discharge-she acknowledges mental status improvement.  Patient does wear dentures-which are at home-suspect this is the reason why patient speech is somewhat garbled.  Disposition Plan: Remain inpatient- SNF when bed available  Antimicrobial agents: Anti-infectives (From admission, onward)   Start     Dose/Rate Route Frequency Ordered Stop   07/16/18 1700  levofloxacin (LEVAQUIN) IVPB 500 mg     500 mg 100 mL/hr over 60 Minutes Intravenous  Once 07/16/18 1656 07/16/18 2205      Procedures: None  CONSULTS:  neurology  Time spent: 25- minutes-Greater than 50% of this time was spent in counseling, explanation of diagnosis, planning of further management, and coordination of care.  MEDICATIONS: Scheduled Meds: . arformoterol  15 mcg Nebulization BID  . aspirin  81 mg Oral Daily  . budesonide (PULMICORT) nebulizer solution  0.25 mg Nebulization BID  . chlorhexidine  15 mL Mouth Rinse BID  . heparin  5,000 Units Subcutaneous Q8H  . insulin aspart  0-15 Units Subcutaneous Q4H  . insulin NPH Human  20 Units Subcutaneous QAC breakfast  . levothyroxine  200 mcg Oral Q0600  . mouth rinse  15 mL Mouth Rinse BID  . polyethylene glycol  17 g Oral Daily  . sodium chloride HYPERTONIC  4 mL Nebulization Q6H WA  .  tamsulosin  0.4 mg Oral Q1200   Continuous Infusions: PRN Meds:.acetaminophen **OR** [DISCONTINUED] acetaminophen, albuterol, bisacodyl, haloperidol lactate, hydrALAZINE,  RESOURCE THICKENUP CLEAR   PHYSICAL EXAM: Vital signs: Vitals:   07/28/18 2012 07/29/18 0240 07/29/18 0402 07/29/18 0807  BP: 124/70  114/69   Pulse: (!) 101  92   Resp: 20  20   Temp: 99.6 F (37.6 C)  98.9 F (37.2 C)   TempSrc: Oral  Oral   SpO2: 99% 95% 98% 95%  Weight:      Height:       Filed Weights   07/16/18 1920 07/17/18 0347  Weight: 100.1 kg 101.3 kg   Body mass index is 29.46 kg/m.   General appearance:Awake, alert-slow-follows my commands-moves all 4 extremities when asked.  Able to squeeze my fingers when asked. Eyes:no scleral icterus. HEENT: Atraumatic and Normocephalic Neck: supple, no JVD. Resp:Good air entry bilaterally,no rales or rhonchi CVS: S1 S2 regular, no murmurs.  GI: Bowel sounds present, Non tender and not distended with no gaurding, rigidity or rebound. Extremities no edema  neurology:  Non focal-but has generalized weakness Musculoskeletal:No digital cyanosis Skin:No Rash, warm and dry Wounds:N/A  I have personally reviewed following labs and imaging studies  LABORATORY DATA: CBC: Recent Labs  Lab 07/24/18 0348 07/25/18 0510 07/26/18 0401 07/27/18 0413 07/28/18 0324  WBC 6.9 6.8 6.4 7.0 6.3  NEUTROABS 5.3 5.3 5.0 5.5 4.8  HGB 10.7* 10.9* 10.6* 10.8* 10.6*  HCT 35.2* 36.0* 34.6* 35.6* 35.1*  MCV 101.7* 102.6* 103.9* 103.2* 103.8*  PLT 137* 141* 154 145* 099    Basic Metabolic Panel: Recent Labs  Lab 07/24/18 0348 07/25/18 0510 07/26/18 0401 07/27/18 0413 07/28/18 0324  NA 144 145 145 146* 146*  K 4.5 4.4 4.5 4.5 4.4  CL 103 102 101 106 106  CO2 32 32 33* 34* 31  GLUCOSE 151* 154* 159* 150* 146*  BUN 21 21 25* 32* 35*  CREATININE 1.50* 1.55* 1.57* 1.66* 1.63*  CALCIUM 8.9 8.9 8.9 9.1 9.2  MG 2.5* 2.6* 2.6* 2.8* 2.6*    GFR: Estimated Creatinine Clearance: 49 mL/min (A) (by C-G formula based on SCr of 1.63 mg/dL (H)).  Liver Function Tests: Recent Labs  Lab 07/22/18 0926 07/23/18 0404 07/25/18 0510  07/27/18 0413  AST 44* 44* 49* 43*  ALT 32 32 40 42  ALKPHOS 72 68 85 98  BILITOT 0.9 0.8 1.1 0.7  PROT 6.1* 6.0* 6.0* 6.2*  ALBUMIN 3.3* 3.3* 3.2* 3.2*   No results for input(s): LIPASE, AMYLASE in the last 168 hours. Recent Labs  Lab 07/27/18 0413  AMMONIA 22    Coagulation Profile: No results for input(s): INR, PROTIME in the last 168 hours.  Cardiac Enzymes: Recent Labs  Lab 07/27/18 0413  CKTOTAL 374    BNP (last 3 results) No results for input(s): PROBNP in the last 8760 hours.  HbA1C: No results for input(s): HGBA1C in the last 72 hours.  CBG: Recent Labs  Lab 07/28/18 1655 07/28/18 2008 07/28/18 2347 07/29/18 0359 07/29/18 0733  GLUCAP 164* 146* 153* 155* 160*    Lipid Profile: No results for input(s): CHOL, HDL, LDLCALC, TRIG, CHOLHDL, LDLDIRECT in the last 72 hours.  Thyroid Function Tests: No results for input(s): TSH, T4TOTAL, FREET4, T3FREE, THYROIDAB in the last 72 hours.  Anemia Panel: No results for input(s): VITAMINB12, FOLATE, FERRITIN, TIBC, IRON, RETICCTPCT in the last 72 hours.  Urine analysis:    Component Value Date/Time   COLORURINE YELLOW 07/26/2018 1201  APPEARANCEUR CLEAR 07/26/2018 1201   LABSPEC 1.025 07/26/2018 1201   PHURINE 5.0 07/26/2018 1201   GLUCOSEU NEGATIVE 07/26/2018 [place] 07/26/2018 [place] 07/26/2018 [place] 07/26/2018 1201   PROTEINUR NEGATIVE 07/26/2018 1201   UROBILINOGEN 0.2 02/24/2015 2333   NITRITE NEGATIVE 07/26/2018 1201   LEUKOCYTESUR NEGATIVE 07/26/2018 1201    Sepsis Labs: Lactic Acid, Venous    Component Value Date/Time   LATICACIDVEN 1.90 07/16/2018 1458    MICROBIOLOGY: Recent Results (from the past 240 hour(s))  Urine Culture     Status: Abnormal   Collection Time: 07/26/18 12:01 PM  Result Value Ref Range Status   Specimen Description URINE, RANDOM  Final   Special Requests   Final    NONE Performed at [facility], [place] [address], [place], [place] [address]    Culture >=100,000 COLONIES/mL ENTEROCOCCUS FAECALIS (A)  Final   Report Status 07/28/2018 FINAL  Final   Organism ID, Bacteria ENTEROCOCCUS FAECALIS (A)  Final      Susceptibility   Enterococcus faecalis - MIC*    AMPICILLIN <=2 SENSITIVE Sensitive     LEVOFLOXACIN >=8 RESISTANT Resistant     NITROFURANTOIN <=16 SENSITIVE Sensitive     VANCOMYCIN 1 SENSITIVE Sensitive     * >=100,000 COLONIES/mL ENTEROCOCCUS FAECALIS    RADIOLOGY STUDIES/RESULTS: Dg Chest 2 View  Result Date: 07/19/2018 CLINICAL DATA:  Cough, question aspiration pneumonia or pneumonia EXAM: CHEST - 2 VIEW COMPARISON:  07/17/2018 FINDINGS: Upper normal heart size. Mediastinal contours and pulmonary vascularity normal. Atherosclerotic calcification aorta. Small amount of barium at and presumed barium tablet are identified within thoracic esophagus. Lungs clear. No pleural effusion or pneumothorax. No acute osseous findings. IMPRESSION: Retained barium and presumed barium tablet within thoracic esophagus. No acute pulmonary abnormalities. Electronically Signed   By: Lavonia Dana M.D.   On: 07/19/2018 13:47   Ct Head Wo Contrast  Result Date: 07/16/2018 CLINICAL DATA:  78 year old male with history of trauma. Altered mental status. Found down. EXAM: CT HEAD WITHOUT CONTRAST CT CERVICAL SPINE WITHOUT CONTRAST TECHNIQUE: Multidetector CT imaging of the head and cervical spine was performed following the standard protocol without intravenous contrast. Multiplanar CT image reconstructions of the cervical spine were also generated. COMPARISON:  Head CT 03/07/2017.  Cervical spine CT 03/03/2016. FINDINGS: CT HEAD FINDINGS Brain: Mild cerebral atrophy. Patchy and confluent areas of decreased attenuation are noted throughout the deep and periventricular white matter of the cerebral hemispheres bilaterally, compatible with chronic microvascular ischemic disease. No evidence of acute infarction,  hemorrhage, hydrocephalus, extra-axial collection or mass lesion/mass effect. Vascular: No hyperdense vessel or unexpected calcification. Skull: Normal. Negative for fracture or focal lesion. Sinuses/Orbits: Small mucosal retention cyst or polyp in the anterior aspect of the left maxillary sinus incidentally noted. No acute finding. Other: None. CT CERVICAL SPINE FINDINGS Alignment: Exaggerated lordosis at C2-C3, likely positional. Alignment is otherwise anatomic. Skull base and vertebrae: Status post ACDF at C3-C4 with complete bony fusion at this level. No acute fracture. No primary bone lesion or focal pathologic process. Soft tissues and spinal canal: No prevertebral fluid or swelling. No visible canal hematoma. Disc levels: Multilevel degenerative disc disease, most severe at C6-C7 and T1-T2. Moderate multilevel facet arthropathy. Upper chest: Emphysema. Other: None. IMPRESSION: 1. No evidence of significant acute traumatic injury to the skull, brain or cervical spine. 2. Mild cerebral atrophy with chronic microvascular ischemic changes in the cerebral white matter. 3. Multilevel degenerative disc  disease and cervical spondylosis, with postoperative changes of ACDF at C3-C4, as above. Electronically Signed   By: Vinnie Langton M.D.   On: 07/16/2018 16:24   Ct Cervical Spine Wo Contrast  Result Date: 07/16/2018 CLINICAL DATA:  78 year old male with history of trauma. Altered mental status. Found down. EXAM: CT HEAD WITHOUT CONTRAST CT CERVICAL SPINE WITHOUT CONTRAST TECHNIQUE: Multidetector CT imaging of the head and cervical spine was performed following the standard protocol without intravenous contrast. Multiplanar CT image reconstructions of the cervical spine were also generated. COMPARISON:  Head CT 03/07/2017.  Cervical spine CT 03/03/2016. FINDINGS: CT HEAD FINDINGS Brain: Mild cerebral atrophy. Patchy and confluent areas of decreased attenuation are noted throughout the deep and periventricular  white matter of the cerebral hemispheres bilaterally, compatible with chronic microvascular ischemic disease. No evidence of acute infarction, hemorrhage, hydrocephalus, extra-axial collection or mass lesion/mass effect. Vascular: No hyperdense vessel or unexpected calcification. Skull: Normal. Negative for fracture or focal lesion. Sinuses/Orbits: Small mucosal retention cyst or polyp in the anterior aspect of the left maxillary sinus incidentally noted. No acute finding. Other: None. CT CERVICAL SPINE FINDINGS Alignment: Exaggerated lordosis at C2-C3, likely positional. Alignment is otherwise anatomic. Skull base and vertebrae: Status post ACDF at C3-C4 with complete bony fusion at this level. No acute fracture. No primary bone lesion or focal pathologic process. Soft tissues and spinal canal: No prevertebral fluid or swelling. No visible canal hematoma. Disc levels: Multilevel degenerative disc disease, most severe at C6-C7 and T1-T2. Moderate multilevel facet arthropathy. Upper chest: Emphysema. Other: None. IMPRESSION: 1. No evidence of significant acute traumatic injury to the skull, brain or cervical spine. 2. Mild cerebral atrophy with chronic microvascular ischemic changes in the cerebral white matter. 3. Multilevel degenerative disc disease and cervical spondylosis, with postoperative changes of ACDF at C3-C4, as above. Electronically Signed   By: Vinnie Langton M.D.   On: 07/16/2018 16:24   Mr Brain Wo Contrast  Result Date: 07/17/2018 CLINICAL DATA:  Altered level of consciousness, unexplained. Encephalopathy. EXAM: MRI HEAD WITHOUT CONTRAST TECHNIQUE: Multiplanar, multiecho pulse sequences of the brain and surrounding structures were obtained without intravenous contrast. COMPARISON:  CT head without contrast 07/16/2018 FINDINGS: Brain: Study is mildly degraded by patient motion. Mild generalized atrophy and white matter disease is within normal limits for age. No acute infarct, hemorrhage, or  mass lesion is present. The ventricles are of proportionate to the degree of atrophy. No significant extraaxial fluid collection is present. The internal auditory canals are within normal limits. The brainstem and cerebellum are within normal limits. Vascular: Flow is present in the major intracranial arteries. Skull and upper cervical spine: Cervical fusion is noted at C3-4. There is slight retrolisthesis and disc disease at C2-3. The craniocervical junction is normal. Midline structures are unremarkable otherwise. Exaggerated cervical lordosis is noted. Sinuses/Orbits: The paranasal sinuses and right mastoid air cells are clear. A left mastoid effusion is present. No obstructing nasopharyngeal lesion is present. The globes and orbits are within normal limits. IMPRESSION: 1. Normal MRI appearance of the brain for age. No acute or focal abnormality to explain the patient's symptoms. 2. Left mastoid effusion. No obstructing nasopharyngeal lesion is present. 3. Degenerative changes of the cervical spine adjacent level disease at C2-3 following fusion at C3-4. Electronically Signed   By: San Morelle M.D.   On: 07/17/2018 12:29   US Renal  Result Date: 07/16/2018 CLINICAL DATA:  Acute renal failure EXAM: RENAL / URINARY TRACT ULTRASOUND COMPLETE COMPARISON:  None. FINDINGS: Right  Kidney: Renal measurements: 10.3 x 4.9 x 5.3 cm = volume: 139 mL. Diffuse renal parenchymal atrophy. No hydronephrosis or mass lesion identified. Left Kidney: Renal measurements: 8.7 x 4.8 x 5 cm = volume: 109 mL. Limited visualization. Lower pole is obscured by overlying bowel gas. There appears to be diffuse renal parenchymal atrophy. No mass or hydronephrosis demonstrated as visualized. Bladder: Bladder is not visualized, completely decompressed. IMPRESSION: Bilateral diffuse renal parenchymal atrophy.  No hydronephrosis. Electronically Signed   By: Lucienne Capers M.D.   On: 07/16/2018 20:19   Dg Chest Port 1 View  Result  Date: 07/26/2018 CLINICAL DATA:  Dyspnea. EXAM: PORTABLE CHEST 1 VIEW COMPARISON:  07/19/2018 FINDINGS: The Chin overlies the apices. Normal heart size for level of inspiration. Atherosclerosis in the transverse aorta. Mild right hemidiaphragm elevation. No pleural effusion or pneumothorax. Mild subsegmental atelectasis at the right lung base. IMPRESSION: No acute cardiopulmonary disease. Aortic Atherosclerosis (ICD10-I70.0). Electronically Signed   By: Abigail Miyamoto M.D.   On: 07/26/2018 13:20   Dg Chest Port 1 View  Result Date: 07/17/2018 CLINICAL DATA:  Crackles in the lung bases on auscultatory examination. EXAM: PORTABLE CHEST 1 VIEW COMPARISON:  07/16/2018 and earlier. FINDINGS: Cardiac silhouette mildly to moderately enlarged for AP portable technique, unchanged. Minimal atelectasis at the RIGHT lung base, new since yesterday. Lungs otherwise clear. No confluent airspace consolidation. No visible pleural effusions, though the costophrenic sulci are excluded from the image. IMPRESSION: Minimal RIGHT basilar atelectasis. No acute cardiopulmonary disease otherwise. Stable cardiomegaly without pulmonary edema. Electronically Signed   By: Evangeline Dakin M.D.   On: 07/17/2018 21:41   Dg Chest Port 1 View  Result Date: 07/16/2018 CLINICAL DATA:  Patient found down by family. EXAM: PORTABLE CHEST 1 VIEW COMPARISON:  03/07/2017 FINDINGS: The heart size and mediastinal contours are within normal limits. Aortic atherosclerosis is noted at the arch, stable in appearance without aneurysm. Both lungs are clear. The visualized skeletal structures are unremarkable. IMPRESSION: No active disease. Electronically Signed   By: Ashley Royalty M.D.   On: 07/16/2018 14:56   Dg Knee Complete 4 Views Left  Result Date: 07/16/2018 CLINICAL DATA:  Left knee swelling. Patient found down today. EXAM: LEFT KNEE - COMPLETE 4+ VIEW COMPARISON:  03/04/2016 FINDINGS: Tricompartmental osteoarthritis of the knee is redemonstrated  with moderate femorotibial and patellofemoral joint space narrowing and spurring identified. Cortical irregularity of the anterior aspect of the medial femoral condyle is seen on the lateral view which appears more likely to be chronic as it appears likely present on the previous exam though more obscured by overlap from the patient's patella. A moderate-sized suprapatellar joint effusion is identified on current exam, increased from prior without fat fluid level. IMPRESSION: 1. 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 2. Cortical irregularity of the anterior aspect of the medial femoral condyle is seen on the lateral view which appears more likely to be chronic as it appears more obscured by overlap from the patient's patella. If there is pain out of proportion to radiographic findings however, consider cross-sectional imaging such as CT for better assessment. - Electronically Signed   By: Ashley Royalty M.D.   On: 07/16/2018 17:09     LOS: 13 days   Oren Binet, MD  [facility]  If 7PM-7AM, please contact night-coverage  Please page via [email or web address] [email or web address]-click on MD name and type text message  07/29/2018, 9:00 AM

## 2018-07-29 NOTE — Clinical Social Work Placement (Signed)
   CLINICAL SOCIAL WORK PLACEMENT  NOTE  Date:  07/29/2018  Patient Details  Name: Harold Martin MRN: [id number] Date of Birth: August 15, 1940  Clinical Social Work is seeking post-discharge placement for this patient at the [place] level of care (*CSW will initial, date and re-position this form in  chart as items are completed):  Yes   Patient/family provided with [facility] Work Department's list of facilities offering this level of care within the geographic area requested by the patient (or if unable, by the patient's family).  Yes   Patient/family informed of their freedom to choose among providers that offer the needed level of care, that participate in Medicare, Medicaid or managed care program needed by the patient, have an available bed and are willing to accept the patient.  Yes   Patient/family informed of [facility]'s ownership interest in [facility] and [facility], as well as of the fact that they are under no obligation to receive care at these facilities.  PASRR submitted to EDS on       PASRR number received on       Existing PASRR number confirmed on 07/19/18     FL2 transmitted to all facilities in geographic area requested by pt/family on 07/19/18     FL2 transmitted to all facilities within larger geographic area on       Patient informed that his/her managed care company has contracts with or will negotiate with certain facilities, including the following:        Yes   Patient/family informed of bed offers received.  Patient chooses bed at [facility] and [facility] recommends and patient chooses bed at      Patient to be transferred to [facility] on 07/29/18.  Patient to be transferred to facility by [facility]     Patient family notified on 07/29/18 of transfer.  Name of family member notified:  [name], daughter     PHYSICIAN       Additional Comment:     _______________________________________________ Benard Halsted, LCSW 07/29/2018, 10:39 AM

## 2018-08-07 DEATH — deceased

## 2019-08-08 DEATH — deceased
